# Patient Record
Sex: Female | Born: 1970 | Race: Black or African American | Hispanic: No | Marital: Single | Smoking: Current every day smoker
Health system: Southern US, Community
[De-identification: ages and names within clinical notes are randomized; demographics above are authoritative.]

## PROBLEM LIST (undated history)

## (undated) ENCOUNTER — Emergency Department (HOSPITAL_COMMUNITY): Admission: EM | Payer: Medicaid Other | Source: Home / Self Care

## (undated) DIAGNOSIS — I10 Essential (primary) hypertension: Secondary | ICD-10-CM

## (undated) DIAGNOSIS — F32A Depression, unspecified: Secondary | ICD-10-CM

## (undated) DIAGNOSIS — R569 Unspecified convulsions: Secondary | ICD-10-CM

## (undated) DIAGNOSIS — E079 Disorder of thyroid, unspecified: Secondary | ICD-10-CM

## (undated) DIAGNOSIS — F329 Major depressive disorder, single episode, unspecified: Secondary | ICD-10-CM

## (undated) DIAGNOSIS — N289 Disorder of kidney and ureter, unspecified: Secondary | ICD-10-CM

## (undated) HISTORY — PX: CERVICAL CERCLAGE: SHX1329

---

## 2000-09-26 ENCOUNTER — Other Ambulatory Visit: Admission: RE | Admit: 2000-09-26 | Discharge: 2000-09-26 | Payer: Self-pay | Admitting: Obstetrics & Gynecology

## 2000-10-11 ENCOUNTER — Encounter (INDEPENDENT_AMBULATORY_CARE_PROVIDER_SITE_OTHER): Payer: Self-pay

## 2000-10-11 ENCOUNTER — Other Ambulatory Visit: Admission: RE | Admit: 2000-10-11 | Discharge: 2000-10-11 | Payer: Self-pay | Admitting: Obstetrics & Gynecology

## 2001-04-06 ENCOUNTER — Encounter (INDEPENDENT_AMBULATORY_CARE_PROVIDER_SITE_OTHER): Payer: Self-pay

## 2001-04-06 ENCOUNTER — Inpatient Hospital Stay (HOSPITAL_COMMUNITY): Admission: AD | Admit: 2001-04-06 | Discharge: 2001-04-08 | Payer: Self-pay | Admitting: Obstetrics & Gynecology

## 2003-06-23 ENCOUNTER — Other Ambulatory Visit: Admission: RE | Admit: 2003-06-23 | Discharge: 2003-06-23 | Payer: Self-pay | Admitting: Obstetrics & Gynecology

## 2003-08-17 ENCOUNTER — Inpatient Hospital Stay (HOSPITAL_COMMUNITY): Admission: AD | Admit: 2003-08-17 | Discharge: 2003-08-26 | Payer: Self-pay | Admitting: *Deleted

## 2003-08-21 ENCOUNTER — Encounter (INDEPENDENT_AMBULATORY_CARE_PROVIDER_SITE_OTHER): Payer: Self-pay | Admitting: *Deleted

## 2003-08-24 ENCOUNTER — Encounter: Payer: Self-pay | Admitting: Pulmonary Disease

## 2004-11-03 ENCOUNTER — Emergency Department (HOSPITAL_COMMUNITY): Admission: EM | Admit: 2004-11-03 | Discharge: 2004-11-03 | Payer: Self-pay | Admitting: Emergency Medicine

## 2005-07-20 ENCOUNTER — Ambulatory Visit: Payer: Self-pay | Admitting: *Deleted

## 2005-07-20 ENCOUNTER — Inpatient Hospital Stay (HOSPITAL_COMMUNITY): Admission: AD | Admit: 2005-07-20 | Discharge: 2005-08-22 | Payer: Self-pay | Admitting: Obstetrics

## 2005-08-19 ENCOUNTER — Encounter (INDEPENDENT_AMBULATORY_CARE_PROVIDER_SITE_OTHER): Payer: Self-pay | Admitting: Specialist

## 2006-03-14 ENCOUNTER — Ambulatory Visit (HOSPITAL_COMMUNITY): Admission: RE | Admit: 2006-03-14 | Discharge: 2006-03-14 | Payer: Self-pay | Admitting: Obstetrics

## 2006-04-17 ENCOUNTER — Ambulatory Visit (HOSPITAL_COMMUNITY): Admission: RE | Admit: 2006-04-17 | Discharge: 2006-04-17 | Payer: Self-pay | Admitting: Obstetrics

## 2006-04-20 ENCOUNTER — Inpatient Hospital Stay (HOSPITAL_COMMUNITY): Admission: RE | Admit: 2006-04-20 | Discharge: 2006-04-24 | Payer: Self-pay | Admitting: Obstetrics

## 2006-05-03 ENCOUNTER — Ambulatory Visit: Payer: Self-pay | Admitting: Internal Medicine

## 2006-05-03 ENCOUNTER — Inpatient Hospital Stay (HOSPITAL_COMMUNITY): Admission: AD | Admit: 2006-05-03 | Discharge: 2006-08-24 | Payer: Self-pay | Admitting: Obstetrics & Gynecology

## 2006-05-09 ENCOUNTER — Encounter: Payer: Self-pay | Admitting: Obstetrics & Gynecology

## 2006-05-10 ENCOUNTER — Encounter: Payer: Self-pay | Admitting: Obstetrics

## 2006-05-11 ENCOUNTER — Encounter (INDEPENDENT_AMBULATORY_CARE_PROVIDER_SITE_OTHER): Payer: Self-pay | Admitting: *Deleted

## 2006-05-16 ENCOUNTER — Encounter: Payer: Self-pay | Admitting: Obstetrics

## 2006-05-24 ENCOUNTER — Encounter: Payer: Self-pay | Admitting: Obstetrics

## 2006-08-11 ENCOUNTER — Encounter: Payer: Self-pay | Admitting: Obstetrics & Gynecology

## 2006-08-22 ENCOUNTER — Encounter (INDEPENDENT_AMBULATORY_CARE_PROVIDER_SITE_OTHER): Payer: Self-pay | Admitting: Specialist

## 2007-08-28 ENCOUNTER — Emergency Department (HOSPITAL_COMMUNITY): Admission: EM | Admit: 2007-08-28 | Discharge: 2007-08-28 | Payer: Self-pay | Admitting: Emergency Medicine

## 2008-12-25 ENCOUNTER — Encounter (HOSPITAL_COMMUNITY): Admission: RE | Admit: 2008-12-25 | Discharge: 2009-02-20 | Payer: Self-pay | Admitting: Endocrinology

## 2009-01-09 ENCOUNTER — Ambulatory Visit (HOSPITAL_COMMUNITY): Admission: RE | Admit: 2009-01-09 | Discharge: 2009-01-09 | Payer: Self-pay | Admitting: Endocrinology

## 2009-12-09 ENCOUNTER — Encounter (HOSPITAL_COMMUNITY): Admission: RE | Admit: 2009-12-09 | Discharge: 2010-01-08 | Payer: Self-pay | Admitting: Endocrinology

## 2010-01-01 ENCOUNTER — Inpatient Hospital Stay (HOSPITAL_COMMUNITY): Admission: EM | Admit: 2010-01-01 | Discharge: 2010-01-02 | Payer: Self-pay | Admitting: Emergency Medicine

## 2010-01-02 ENCOUNTER — Ambulatory Visit: Payer: Self-pay | Admitting: Psychiatry

## 2010-01-02 ENCOUNTER — Inpatient Hospital Stay (HOSPITAL_COMMUNITY): Admission: RE | Admit: 2010-01-02 | Discharge: 2010-01-04 | Payer: Self-pay | Admitting: Psychiatry

## 2010-03-04 ENCOUNTER — Ambulatory Visit: Payer: Self-pay | Admitting: Psychiatry

## 2010-05-09 ENCOUNTER — Encounter: Payer: Self-pay | Admitting: Obstetrics

## 2010-05-10 ENCOUNTER — Encounter: Payer: Self-pay | Admitting: Internal Medicine

## 2010-07-01 LAB — URINALYSIS, ROUTINE W REFLEX MICROSCOPIC
Glucose, UA: NEGATIVE mg/dL
Ketones, ur: NEGATIVE mg/dL
Nitrite: NEGATIVE
Protein, ur: NEGATIVE mg/dL
Urobilinogen, UA: 0.2 mg/dL (ref 0.0–1.0)

## 2010-07-01 LAB — DIFFERENTIAL
Basophils Absolute: 0 10*3/uL (ref 0.0–0.1)
Basophils Relative: 0 % (ref 0–1)
Eosinophils Absolute: 0 10*3/uL (ref 0.0–0.7)
Neutrophils Relative %: 71 % (ref 43–77)

## 2010-07-01 LAB — COMPREHENSIVE METABOLIC PANEL
ALT: 9 U/L (ref 0–35)
Alkaline Phosphatase: 45 U/L (ref 39–117)
CO2: 24 mEq/L (ref 19–32)
GFR calc non Af Amer: >60 mL/min (ref >60)
Glucose, Bld: 111 mg/dL — ABNORMAL HIGH (ref 70–99)
Potassium: 3.4 mEq/L — ABNORMAL LOW (ref 3.5–5.1)
Sodium: 136 mEq/L (ref 135–145)

## 2010-07-01 LAB — URINE MICROSCOPIC-ADD ON

## 2010-07-01 LAB — CBC
HCT: 36 % (ref 36.0–46.0)
Hemoglobin: 12.5 g/dL (ref 12.0–15.0)
MCHC: 34.7 g/dL (ref 30.0–36.0)
MCV: 89.7 fL (ref 78.0–100.0)
WBC: 5.1 10*3/uL (ref 4.0–10.5)

## 2010-07-01 LAB — SALICYLATE LEVEL: Salicylate Lvl: <4 mg/dL (ref 2.8–20.0)

## 2010-07-01 LAB — ETHANOL: Alcohol, Ethyl (B): <5 mg/dL (ref 0–10)

## 2010-07-01 LAB — RAPID URINE DRUG SCREEN, HOSP PERFORMED
Benzodiazepines: POSITIVE — AB
Cocaine: NOT DETECTED

## 2010-07-01 LAB — POCT PREGNANCY, URINE: Preg Test, Ur: NEGATIVE

## 2010-09-03 NOTE — Discharge Summary (Signed)
NAMEJAYDYN, Jill Hopkins              ACCOUNT NO.:  [id number]   MEDICAL RECORD NO.:  [id number]          PATIENT TYPE:  INP   LOCATION:  9302                          [facility]  [facility]   PHYSICIAN:  Roseanna Rainbow, M.D.DATE OF BIRTH:  April 20, 1970   DATE OF ADMISSION:  04/20/2006  DATE OF DISCHARGE:  04/24/2006                               DISCHARGE SUMMARY   CHIEF COMPLAINT:  The patient is a 40 year old para 0 African American  female with likely cervical insufficiency with an early intrauterine  pregnancy for a McDonald cervical cerclage.   HISTORY OF PRESENT ILLNESS:  Please see the above.  The patient has had  multiple first and second trimester losses.   ALLERGIES:  No known drug allergies.   MEDICATIONS:  Prenatal vitamins, Pepcid.   PAST SURGICAL HISTORY:  Cervical cerclage.   PAST MEDICAL HISTORY:  She denies.   PHYSICAL EXAMINATION:  VITAL SIGNS:  Pulse 85, respirations 16, blood  pressure 111/72.  GENERAL APPEARANCE:  Well-nourished, well-developed female in no  apparent distress.  HEENT:  Normocephalic, atraumatic.  NECK:  Supple.  LUNGS:  Clear to auscultation bilaterally.  HEART:  Regular rate and rhythm.  ABDOMEN:  Soft, nontender.  ADENOPATHY:  None.  PELVIC:  The external female genitalia normal appearing.  On bimanual  exam the cervix is long and closed.  EXTREMITIES:  No clubbing, cyanosis or edema.  SKIN:  Without rash.   ASSESSMENT:  Cervical insufficiency.   The planned procedure was a McDonald cerclage.   HOSPITAL COURSE:  Please see the dictated operative summary for further  details.  She was given a course of parenteral clindamycin and continued  on bedrest in the hospital.  She was then discharged to home on January  7.   DISCHARGE DIAGNOSIS:  Cervical insufficiency early pregnancy.   PROCEDURE:  McDonald cerclage.   CONDITION:  Stable.   DIET:  Regular.   ACTIVITY:  Bedrest, pelvic rest.   DISPOSITION:  The patient was to follow  up in the office in 2 weeks.      Roseanna Rainbow, M.D.  Electronically Signed     LAJ/MEDQ  D:  07/28/2006  T:  07/28/2006  Job:  [id number]

## 2010-09-03 NOTE — Discharge Summary (Signed)
[facility]  Patient:    [name], [name] Visit Number: [id number] MRN: [id number]          Service Type: OBS Location: 9300 [id number] Attending Physician:  Genia Del Dictated by:   Genia Del, M.D. Admit Date:  04/06/2001 Discharge Date: 04/08/2001                             Discharge Summary  DATE OF BIRTH:  [birth year]  ADMISSION DIAGNOSES: 1. Premature rupture of membranes at 17+ weeks. 2. Preterm labor with clinical chorioamnionitis. 3. Possible cervical incompetence.  DISCHARGE DIAGNOSES: 1. Premature rupture of membranes at 17+ weeks. 2. Preterm labor with clinical chorioamnionitis. 3. Possible cervical incompetence. 4. Preterm birth. 5. Intrauterine fetal demise at 17+ weeks.  HOSPITAL COURSE:  The patient was admitted on April 06, 2001, at 17+ weeks with preterm rupture of membranes, ______ labor, clinical chorioamnionitis, possible preceding cervical incompetence.  She had a very rapid labor, and ______ vaginal delivery in breech presentation.  Apgars were 0 and 0. Placenta delivered spontaneously and was completed three vessel.  Baby showed no obvious anomaly.  There was a malodorous smell to the amniotic fluid and placenta.  The patient had been started on clindamycin IV upon admission.  The estimated blood loss at the time of delivery was 250 cc.  No tear occurred, no complications.  Rh positive.  The patient was continued on clindamycin IV in the postpartum period.  On postpartum day #1, she was doing well, but was very tired and sad.  Her vital signs were stable.  Temperature was 98 degrees.  Her hemoglobin was 8.8.  On postoperative day #2, she was doing very good, but feeling depressed.  No suicidal ideation.  Her vital signs were still stable. She was afebrile.  After discussion of her risks of postpartum depression, especially in the circumstances, she was started on Zoloft 50 mg q.d.  The patient was  informed about usage, benefits, and risks, and was told that the antidepressant effects would appear only two weeks after starting the medication.  She was also prescribed a few Ambiens and Tylox p.r.n.  She was discharged home, and postpartum advise was given.  Because of anemia, she was advised slow ______.  FOLLOWUP:  She is to follow up with me at [facility] on April 23, 2001. Dictated by:   Genia Del, M.D. Attending Physician:  Genia Del DD:  04/30/01 TD:  05/01/01 Job: [id number]

## 2010-09-03 NOTE — Op Note (Signed)
Jill Hopkins, Jill Hopkins              ACCOUNT NO.:  [id number]   MEDICAL RECORD NO.:  [id number]          PATIENT TYPE:  INP   LOCATION:  9371                          [facility]   PHYSICIAN:  Charles A. Clearance Coots, M.D.DATE OF BIRTH:  08-31-1970   DATE OF PROCEDURE:  07/22/2005  DATE OF DISCHARGE:                                 OPERATIVE REPORT   PREOPERATIVE DIAGNOSIS:  [redacted] weeks gestation with cervical incompetence.   POSTOPERATIVE DIAGNOSIS:  [redacted] weeks gestation with cervical incompetence.   PROCEDURE:  Rescue McDonald cervical cerclage.   SURGEON:  Charles A. Clearance Coots, M.D.   ASSISTANT:  Roseanna Rainbow, M.D.   ANESTHESIA:  Spinal.   ESTIMATED BLOOD LOSS:  Negligible.   COMPLICATIONS:  None.   DRAINS:  Foley to gravity.   FINDINGS:  Dilated cervix with doming of membranes   OPERATION:  The patient was brought to the operating room and after  satisfactory spinal anesthesia, the patient was brought up in stirrups and  placed in steep Trendelenburg position. The vagina was prepped and draped in  usual sterile fashion.  The urinary bladder was emptied of approximately 100  mL of clear urine.  A sterile weighted speculum was placed in the posterior  vaginal vault and the cervix was isolated with the use of Deaver retractor.  The membranes had receded back into the cervical canal and were just  visible.  The cervix appeared about 2 cm dilated. A 30 mL Foley bulb was  then placed down the cervical canal and was inflated and the membranes were  pushed back further towards the lower uterine segment. The Foley bulb was  then capped, inflated, and placed keeping the membranes away from the  operative field. The anterior lip of the cervix was then grasped at the 12  o'clock position and a rescue McDonald cervical cerclage was then placed  starting at the 12 o'clock position and going counterclockwise in routine  fashion back to the 12 o'clock position. There were no  interoperative  complications.  The knot was then cinched and the Foley bulb was deflated.  The Foley catheter was removed.  The knot was then tied down securely with  several knots and was cut approximately 1 inch from the knot for  identification in the future. The vaginal vault had been douched with a  mixture of 150 mg of clindamycin in 60 mL of normal saline, 30 mL of the  clindamycin solution was placed in the vaginal vault prior to surgical  procedure and the remaining 30 mL  of clindamycin solution was placed in the vaginal vault at the conclusion of  the procedure and was left there. There was no active bleeding at the  conclusion of the procedure. All instruments were retired. The patient  tolerated the procedure well and was transported to recovery in satisfactory  condition.      Charles A. Clearance Coots, M.D.  Electronically Signed     CAH/MEDQ  D:  07/22/2005  T:  07/22/2005  Job:  [id number]

## 2010-09-03 NOTE — Discharge Summary (Signed)
NAME:  Jill Hopkins, Jill Hopkins                        ACCOUNT NO.:  [id number]   MEDICAL RECORD NO.:  [id number]                   PATIENT TYPE:  INP   LOCATION:  5743                                 FACILITY:  [facility]   PHYSICIAN:  Oley Balm. Sung Amabile, M.D. [facility]          DATE OF BIRTH:  02-Jun-1970   DATE OF ADMISSION:  08/21/2003  DATE OF DISCHARGE:  08/26/2003                                 DISCHARGE SUMMARY   ADMISSION DIAGNOSES:  1. Endometritis.  2. Sepsis.  3. Acute lung injury.  4. Anemia.   DISCHARGE DIAGNOSES:  1. Endometritis.  2. Sepsis.  3. Acute lung injury.  4. Anemia.   HISTORY OF PRESENT ILLNESS:  Please refer to the admission history and  physical for this patient's initial presentation.  Briefly, she was  initially admitted to [facility] on Aug 18, 2003 with a 17-week  pregnancy.  She suffered ruptured membranes and ultimately, a spontaneous  abortion.  She did require a D&C to deliver the placenta.  Shortly  thereafter, she became hypotensive and hypoxemic.  She was consequently  transferred to [facility] for further management, where the  Critical Care Medicine Service assumed her care.  She was admitted with the  above diagnoses.   HOSPITAL COURSE:  On admission, her chest x-ray demonstrated diffuse  pulmonary infiltrates consistent with acute lung injury.  Her gas exchange  was never severely impaired and she never require more than 4 L by nasal  cannula.  Her blood pressure improved with volume resuscitation.  She did  also have mild DIC.  She was treated with broad-spectrum antibiotics, Zosyn,  and also received packed red blood cells for anemia.  On the evening of Aug 21, 2003, she was seen by Dr. Shan Levans, who initiated therapy with  Xigris, based on 2-organ failure, acute lung injury and DIC.  By Aug 22, 2003, she was markedly improved and orders were given to ambulate her and  advance her diet.  By Aug 23, 2003, she was so dramatically  improved that the  Xigris was discontinued before completing the course.  She was transferred  to a stepdown unit.  By Aug 24, 2003, her antibiotics were changed to  ciprofloxacin.  She was noted to still have a leukocytosis at that time with  a white blood cell count of 32,500.  By Aug 25, 2003, her white count had  dropped to 18,000 and she was complaining of right upper quadrant pain.  Her  abdominal exam at that time was unremarkable.  A right upper quadrant  abdominal ultrasound was performed which demonstrated no evidence of  cholecystitis or gallstones.  Hepatic panel and pancreatic enzymes were  normal.  On the day of discharge, she was markedly improved, but still with  mild abdominal pain.  She continues to complain of vaginal bleeding.  It is  notable, however, that her hemoglobin and hematocrit had increased  progressively  over the past 3 days.  She has not had any hemodynamic  instability over the past couple of days.  In addition, her chest x-ray has  cleared dramatically and her oxygen saturations are normal on room air.  By  the time of discharge, she is overall dramatically improved with no ongoing  active issues requiring hospitalization.   DISCHARGE MEDICATIONS:  1. Tylenol as needed for pain.  2. Percocet 5/500 mg 1 p.o. q.4-6 h. p.r.n.   FOLLOWUP INSTRUCTIONS:  She has been instructed to call her obstetrician,  Dr. Genia Del, to arrange followup later this week and to address  the issue of vaginal bleeding.  I have not scheduled a followup with me, but  would be happy to see her at any time in the future as deemed necessary by  her other care providers.                                                Oley Balm Sung Amabile, M.D. [facility]    DBS/MEDQ  D:  08/26/2003  T:  08/27/2003  Job:  [id number]   cc:   Genia Del, M.D.  [address]  [place]  [place] [address]  Fax: [phone]   Chester Holstein. Earlene Plater, M.D.  [address]  [place]  [place] [address]  Fax: [phone]

## 2010-09-03 NOTE — Op Note (Signed)
Jill Hopkins, Jill Hopkins              ACCOUNT NO.:  [id number]   MEDICAL RECORD NO.:  [id number]          PATIENT TYPE:  AMB   LOCATION:  SDC                           [facility]   PHYSICIAN:  Charles A. Clearance Coots, M.D.DATE OF BIRTH:  1971/03/31   DATE OF PROCEDURE:  04/20/2006  DATE OF DISCHARGE:                               OPERATIVE REPORT   PREOPERATIVE DIAGNOSIS:  Incompetent cervix.   POSTOPERATIVE DIAGNOSIS:  Incompetent cervix.   PROCEDURE:  McDonald cervical cerclage.   SURGEON:  Coral Ceo, M.D.   ANESTHESIA:  Spinal.   ESTIMATED BLOOD LOSS:  Minimal.   COMPLICATIONS:  None.   CATHETERS:  Foley to gravity.   OPERATION:  The patient was brought to the operating room and after  satisfactory spinal anesthesia, the legs were brought up in stirrups and  the vagina was prepped and draped in the usual sterile fashion.  The  urinary bladder was emptied of approximately 50 mL of clear urine.  A  sterile weighted speculum was then inserted into the posterior vaginal  vault and the cervix was isolated with the aid of Sims retractors.  A  McDonald cervical cerclage was then placed using a medium Mayo needle  with double throw of a #5 silk suture.  The stitch was cinched down at  the conclusion of the procedure and multiple knots were tied for future  identification of the stitch along with approximately a 1 inch tail of  suture.  There was no active bleeding at the conclusion of the  procedure.  All instruments were retired.  The vagina was lavaged with a  30 mL mixture of 150 mg of clindamycin and 30 mL of normal saline.  The  patient tolerated the procedure well and was transported to the recovery  room in satisfactory condition.      Charles A. Clearance Coots, M.D.  Electronically Signed     CAH/MEDQ  D:  04/20/2006  T:  04/20/2006  Job:  [id number]

## 2010-09-03 NOTE — Discharge Summary (Signed)
NAMESETSUKO, ROBINS              ACCOUNT NO.:  [id number]   MEDICAL RECORD NO.:  [id number]          PATIENT TYPE:  INP   LOCATION:  9319                          [facility]   PHYSICIAN:  Charles A. Clearance Coots, M.D.DATE OF BIRTH:  [birth year]   DATE OF ADMISSION:  05/03/2006  DATE OF DISCHARGE:  08/24/2006                               DISCHARGE SUMMARY   ADMITTING DIAGNOSES:  1. Fifteen weeks gestation.  2. Incompetent cervix with McDonald cerclage intact with twin      gestation.  3. History of multiple first and second trimester losses.   DISCHARGE DIAGNOSIS:  1. Fifteen weeks gestation.  2. Incompetent cervix with McDonald cerclage intact with twin      gestation.  3. History of multiple first and second trimester losses.  4. Failed McDonald cervical cerclage.  5. Spontaneous rupture of membranes.  6. Delivery of first twin A at [redacted] weeks gestation on May 11, 2006.      The twin was nonviable The cerclage was removed, umbilical cord cut      at the cervix, patient placed on bedrest.  7. After on prolonged bedrest, status post delivery of second twin B      on Aug 22, 2006, at 0547 at [redacted] weeks gestation.  Apgars of 1 at 1      minute, 7 at 5 minutes, weight of 1458 grams, length of 42 cm. The      infant was admitted to the neonatal intensive care unit. Mother was      discharged home on postpartum day #2 in good condition   REASON FOR ADMISSION:  A 40 year old black female, G5, P0-0-4-0, twin  gestation with McDonald cervical cerclage placed at approximately [redacted]  weeks gestation. Presented at [redacted] weeks gestation with complaint of  brownish discharge earlier in the day.   PAST MEDICAL HISTORY:   SURGERY:  Cerclage.   ILLNESSES:  Anemia.   MEDICATIONS:  Prenatal vitamins, progesterone.   ALLERGIES:  No known drug allergies.   SOCIAL HISTORY:  Single. Negative for tobacco, alcohol or recreational  drug use.   GYN HISTORY:  Positive for history of genital  herpes.   OB HISTORY:  Multiple pregnancy losses first and second trimester, known  history of cervical incompetence with a McDonald cerclage intact with  this pregnancy for twins.   PHYSICAL EXAMINATION:  GENERAL:  Well-nourished, well-developed black  female in no acute distress.  VITAL SIGNS:  Temperature 98.3, pulse 98, respiratory rate 18, blood  pressure 132/84.  LUNGS:  Clear to auscultation bilaterally.  HEART:  Regular rate and rhythm.  ABDOMEN: Nontender.  PELVIC:  Cervix on digital exam was closed and long. Doppler fetal heart  tones were 140-150 beats per minute. On sterile speculum exam, there was  scant amount of bright red blood.   IMPRESSION:  1. Twin gestation at approximately 15 weeks.  2. Incompetent cervix.  3. Probable vaginitis.   PLAN:  1. Admit to bedrest.  2. Start parenteral antibiotics   HOSPITAL COURSE:  The patient was admitted and placed on bedrest.  Ultrasound on hospital day #7 revealed  shortened cervical length of  approximately 0.7 centimeters with funneling above the stitch. The  findings were discussed with the patient and recommended examination in  the operating room and possible placement of second cerclage. The  patient was taken to the operating room on hospital day #9. On  examination, the membranes were bulging. An attempt was made to place a  second cerclage that was successful, but the patient had spontaneous  rupture of membranes at the conclusion of the procedure. The cerclage  was removed, and the first cerclage was also removed, and the patient  progressed in the OR to spontaneous vaginal delivery of twin A which was  nonviable. There was no active bleeding or cramping at the conclusion of  the procedure, and the umbilical cord was cut at the cervix. The patient  was taken to the floor, placed on bed rest, and parenteral antibiotic  therapy and continued with pregnancy of second twin B without any  significant cramping. The  patient remained on bedrest until [redacted] weeks  gestation when she noted the onset of uterine contractions, and she was  taken to the labor and delivery floor for expectant management.  She  progressed to spontaneous vaginal delivery of a viable female infant on  Aug 22, 2006, at 0547, Apgars of 5 at 1 minute, 7 at 5 minutes.  The  infant was taken to the neonatal intensive care unit for prematurity.  The patient did well postpartum and was discharged home on postpartum  day #2 in good condition.   ADMITTING LABORATORY VALUES:  Hemoglobin 10.1 hematocrit 29.5, white  blood cell count 7100, platelets 298,000.   DISCHARGE LABORATORY VALUES:  Hemoglobin 10.7, hematocrit 31.3, white  blood cell count 8300, platelets 273,000   DISCHARGE DISPOSITION:   MEDICATIONS:  Prenatal vitamins, and ibuprofen was prescribed for pain.  Routine written instructions were given for discharge after vaginal  delivery.  The patient is to call the office for a followup appointment  in 6 weeks.      Charles A. Clearance Coots, M.D.  Electronically Signed    CAH/MEDQ  D:  09/24/2006  T:  09/24/2006  Job:  [id number]

## 2010-09-03 NOTE — Op Note (Signed)
Jill Hopkins, Jill Hopkins              ACCOUNT NO.:  [id number]   MEDICAL RECORD NO.:  [id number]          PATIENT TYPE:  OUT   LOCATION:  MFM                           [facility]   PHYSICIAN:  Charles A. Clearance Coots, M.D.DATE OF BIRTH:  11/16/1970   DATE OF PROCEDURE:  05/11/2006  DATE OF DISCHARGE:                               OPERATIVE REPORT   PREOPERATIVE DIAGNOSES:  1. Sixteen weeks' gestation, twins.  2. Failed cerclage.   POSTOPERATIVE DIAGNOSIS:  1. Sixteen weeks' gestation, twins.  2. Failed cerclage.  3. Spontaneous rupture of membranes with spontaneous vaginal delivery      of twin A at time of second cerclage placement.   PROCEDURE:  Cervical cerclage.   SURGEON:  Charles A. Clearance Coots, MD   ASSISTANT:  Ginger Carne, MD   ANESTHESIA:  Spinal.   ESTIMATED BLOOD LOSS:  50 mL.   COMPLICATIONS:  Spontaneous rupture of membranes after cerclage  placement.   ASSESSMENT:  Baby A of twin gestation submitted to pathology for  evaluation.   FINDINGS:  Bulging of membranes through the cervical os at the beginning  of procedure upon evaluation of the cervix.   OPERATION:  The patient was brought to the operating room and after  satisfactory spinal anesthesia, the legs were brought up in stirrups and  the vagina was prepped and draped in the usual sterile fashion.  The  urinary bladder was emptied of approximately 100 mL of clear urine.  A  sterile weighted speculum was inserted in the posterior vaginal vault.  The cervix was isolated with the aid of a Deaver retractor and upon  observation of the cervix, the cervix could not be visualized at the  time because of bulging of the membranes through the cervical os.  The  patient was placed in Trendelenburg position and the membranes were  gently pushed back into the uterine cavity and a 30 mL Foley bulb was  placed down the cervical canal past the internal os and inflated,  keeping the membranes retained within the  uterine cavity.  The anterior  and posterior lip of the cervix was then grasped with a ring forceps and  the anterior vesicouterine space was dissected out and the bladder was  pushed back away from the anterior cervix.  The posterior cul-de-sac was  also dissected out and good hemostasis was obtained anteriorly and  posteriorly.  A Shirodkar-type cerclage placement was then done in  routine fashion with Mersilene and the knot was cinched and securely  tied down with multiple knots and the tail was cut approximately a half-  inch.  Upon further visualization, the membranes again protruded through  the cervical os that had been tightly secured with the stitch and then  the membranes progressed to spontaneously rupture with clear fluid being  expelled.  The first McDonald suture and the second cerclage that had  just been placed were both removed and the fetus then presented breech  and was spontaneously delivered.  The umbilical cord was cut and left  intact.  Approximately 3 inches of umbilical cord were protruding  through the  cervical os.  There was no active bleeding at the conclusion  of the procedure.  The mucosal surfaces that had been incised for  placement of the cerclage were closed with continuous interlocking  sutures of 2-0 Monocryl.  All instruments were then retired.  The  patient tolerated the procedure well, was transported to the recovery  room in satisfactory condition.      Charles A. Clearance Coots, M.D.  Electronically Signed     CAH/MEDQ  D:  05/11/2006  T:  05/11/2006  Job:  [id number]

## 2010-09-03 NOTE — Discharge Summary (Signed)
NAME:  WING, GFELLER                        ACCOUNT NO.:  [id number]   MEDICAL RECORD NO.:  [id number]                   PATIENT TYPE:  INP   LOCATION:  9374                                 [facility]ACILITY:  [facility]   PHYSICIAN:  Gerri Spore B. Earlene Plater, M.D.               DATE OF BIRTH:  Mar 25, 1971   DATE OF ADMISSION:  08/17/2003  DATE OF DISCHARGE:  08/21/2003                                 DISCHARGE SUMMARY   ADMISSION DIAGNOSES:  1. Seventeen-week intrauterine pregnancy.  2. Preterm premature rupture of membranes.  3. Chorioamnionitis.  4. Sepsis.   DISCHARGE DIAGNOSES:  1. Seventeen-week intrauterine pregnancy.  2. Preterm premature rupture of membranes.  3. Chorioamnionitis.  4. Sepsis.   HISTORY OF PRESENT ILLNESS:  For complete details, please see the History  and Physical in the chart.  Briefly, the patient presented after traveling  from out-of-town to [place], [place] with ruptured membranes at 17  weeks.  She was confirmed to be ruptured at an outlying hospital in [place],  [place] and it was recommended to the patient that she stay  hospitalized there to be stabilized, given antibiotics to rule out  infection, and if remained stable to be transferred back to [facility].  However, the patient signed out against medical advice and drove  herself back to [place].  I saw the patient on the day of admission at  [facility] maternity admissions, confirmed ruptured membranes, and  ruled out active chorioamnionitis given absence of fever, uterine  tenderness, or significant leukocytosis.   The patient expressed a strong desire to retain the pregnancy if at all  possible as she has had a previous loss in a similar fashion.  It was  discussed with the patient that infection was a significant risk as was  preterm labor.  The patient stated understanding of these issues but  requested expectant management in the hospital.  Given this, I recommended  we treat  her with ampicillin and erythromycin per the P-PROM protocol.   HOSPITAL COURSE:  The patient was admitted and placed on ampicillin and  erythromycin per P-PROM protocol and managed expectantly.  She remained  stable until hospital day #3 when she spiked a fever of 101.8.  She was  complaining of myalgias, low back pain, low abdominal pain.  Was noted to  have uterine tenderness and I diagnosed the patient with chorioamnionitis  and recommended delivery.   The patient was placed on ampicillin, gentamycin, and clindamycin and a  Cytotec induction begun.  She delivered approximately 4 hours later of a  nonviable 17-week female fetus.  The placenta did not deliver immediately.  The patient was stable without active bleeding.  I therefore recommended we  try Pitocin to facilitate delivery of the placenta.  Approximately 2-3 hours  later the patient was showing signs of early sepsis with hypotension,  tachycardia, and low uterine output.  Given these issues I  recommended we  proceed with a suction curettage to evacuate the uterus.   She subsequently underwent suction curettage without incident.  She was  noted to be hypotensive in the 70s over 30s in the PACU.  This did not  respond to aggressive fluid resuscitation.  I therefore called a  consultation with Dr. Sung Amabile of intensive care unit at [facility].  She was also  noted around this same time to be mildly coagulopathic and mildly anemic.  In addition, a chest x-ray in the PACU showed evidence of noncardiogenic  pulmonary edema.  Given the evidence of multi-organ involvement I requested  consultation with Dr. Sung Amabile of critical care.   After discussing the case with Dr. Sung Amabile on the telephone he agreed it was  in the patient's best interests to be transferred to [facility] critical  care service as they now have a new treatment with Xigris for sepsis.   At this point the patient was discharged from our service and transferred to  Dr.  Sung Amabile' service.  Please see his dictated Discharge Summary for  complete details thereafter.  However, in summary, the patient ultimately  did well and was discharged home on Aug 26, 2003.                                               Gerri Spore B. Earlene Plater, M.D.    WBD/MEDQ  D:  08/27/2003  T:  08/27/2003  Job:  [id number]

## 2010-09-03 NOTE — Discharge Summary (Signed)
Jill Hopkins, Jill Hopkins              ACCOUNT NO.:  [id number]   MEDICAL RECORD NO.:  [id number]          PATIENT TYPE:  INP   LOCATION:  9303                          [facility]ACILITY:  [facility]   PHYSICIAN:  Charles A. Clearance Coots, M.D.DATE OF BIRTH:  28-Jul-1970   DATE OF ADMISSION:  07/20/2005  DATE OF DISCHARGE:  08/22/2005                                 DISCHARGE SUMMARY   ADMITTING DIAGNOSIS:  1.  [redacted] weeks gestation.  2.  Preterm cervical changes.  3.  Probable incompetent cervix.  4.  Admitted for cervical cerclage.   DISCHARGE DIAGNOSIS:  1.  [redacted] weeks gestation.  2.  Preterm cervical change.  3.  Probable incompetent cervix.  4.  Admitted for cervical cerclage.  5.  Status post cervical cerclage.  6.  Spontaneous rupture of membranes.  7.  Status post normal spontaneous vaginal delivery of a footling breech in      preterm labor on Aug 19, 2005.  Viable female fetus was delivered and was      resuscitated with Apgars of 1 at one minute, 1 at five minutes, 2 at 10      minutes, 5 at 15 minutes.  The infant was taken to the neonatal      intensive care unit for further resuscitation at approximately [redacted] weeks      gestation.  Mother discharged home in good condition on Aug 22, 2005.      The infant continues to be in the neonatal intensive care unit.   REASON FOR ADMISSION:  A 40 year old black female G4, P0, last menstrual  period March 09, 2005.  Patient presented to the office for her first  prenatal visit with a complaint of leaking yellowish fluid and cramping.  She had an obstetrical history that was significant for three previous  spontaneous abortions and was told that she had an incompetent cervix.  She  denied fever, chills, or dysuria   PAST MEDICAL HISTORY:   SURGERY:  None.   ILLNESSES:  None.   MEDICATIONS:  None.   ALLERGIES:  No known drug allergies.   SOCIAL HISTORY:  Single.  Negative tobacco, alcohol, or recreational drug  use.   PHYSICAL EXAMINATION:   GENERAL:  Well-nourished, well-developed female in no  acute distress.  VITAL SIGNS:  Temperature 98.4, blood pressure 147/96.  LUNGS:  Clear to auscultation bilaterally.  HEART:  Regular rate and rhythm.  ABDOMEN:  Gravid, nontender.  PELVIC:  Normal external female genitalia.  Vagina with moderate amount of  clear mucus in the vault.  The cervix on the speculum examination was noted  to be open, approximately 2-3 cm with membranes doming at the cervical os.   IMPRESSION:  1.  [redacted] weeks gestation.  2.  Preterm cervical changes.   PLAN:  Admit.  Bedrest.  Supportive management.  Will probably consider  rescue cerclage if clinically possible.   ADMITTING LABORATORY VALUES:  Hemoglobin 10.9, hematocrit 31.6, white blood  cell count 7200, platelets 292,000.  Comprehensive metabolic panel was  within normal limits.  Urinalysis revealed large leukocyte esterase, large  hemoglobin, 11-20 white blood cells,  7-10 red blood cells, specific gravity  of 1.01.   HOSPITAL COURSE:  The patient was admitted and placed in the Trendelenburg  position on complete bedrest.  Indwelling Foley catheter within the urinary  bladder for drainage.  She remained stable on bedrest and on hospital day #3  on examination cervix had remained stable and patient was taken to the  operating room for an examination under anesthesia and an attempt at rescue  cervical cerclage.  The patient was given spinal anesthesia and was placed  up in stirrups in the supine position in Trendelenburg and on speculum  examination the cervix was noted to be with adequate length and the  membranes were just barely visible and a decision was made to proceed with a  rescue McDonald cervical cerclage.  A McDonald rescue cervical cerclage was  performed on July 22, 2005 without complications and an excellent stitch was  placed giving the patient excellent cervical length of approximately 3 cm of  cervical length on examination.  She was  taken back to the floor and was  continued on Trendelenburg position and strict bedrest with an indwelling  Foley, perioperative antibiotic coverage.  On postoperative day #5 the  patient had spontaneous rupture of membranes and the McDonald cervical  cerclage stitch was removed surgically because of the spontaneous rupture of  membranes.  The patient was continued on bedrest and did well on bedrest  without any significant evidence of infection or preterm labor symptoms  until May 4 when she indicated that she was having increased cramping that  had started on Aug 18, 2005 and had gotten progressively worse on May 4 and  patient also had noted increased vaginal bleeding.  Examination at that time  revealed the breech presenting in the vaginal vault as a footling breech and  the patient was taken to labor and delivery for delivery of the fetus.  A  footling breech delivery was performed on labor and delivery without  complications.  Because of the advanced stage of the delivery at the time of  examination of the patient initially and with the upper body still being in  the uterus it was not thought that the fetus could be repelled back into the  uterus for cesarean section delivery.  This was explained to the patient and  she cooperated quite well on labor/delivery and was able to complete the  footling breech extraction with two pushes in labor.  The fetus was handed  off to the nursery staff after the umbilical cord was cut and clamped and  neonatal resuscitation was then performed by the team.  The placenta was  retained and Cytotec 400 mg was placed in the vaginal vault along with IV  Pitocin and the patient was able to expel the placenta spontaneously after  the use of intravaginal Cytotec.  There was no active bleeding after  expulsion of the placenta and the uterus contracted down quite well and was firm.  The patient's postpartum course was uncomplicated and she was  discharged home  on postpartum day #3 in good condition.   LABORATORY VALUES:  Hemoglobin 10.8, hematocrit 31.4 white blood cell count  5600, platelets 284,000   DISCHARGE DISPOSITION:  Ibuprofen was prescribed for pain.  The patient is  to continue prenatal vitamins.  Routine written instructions were given for  obstetrical discharge after vaginal delivery.  The patient is to call the  office for a follow-up appointment in two weeks.      Leonette Most  Julio Alm, M.D.  Electronically Signed     CAH/MEDQ  D:  08/29/2005  T:  08/29/2005  Job:  [id number]

## 2010-09-03 NOTE — Op Note (Signed)
NAME:  Jill Hopkins, Jill Hopkins                        ACCOUNT NO.:  [id number]   MEDICAL RECORD NO.:  [id number]                   PATIENT TYPE:  INP   LOCATION:  9374                                 [facility]   PHYSICIAN:  Gerri Spore B. Earlene Plater, M.D.               DATE OF BIRTH:  1970/11/11   DATE OF PROCEDURE:  08/21/2003  DATE OF DISCHARGE:                                 OPERATIVE REPORT   PREOPERATIVE DIAGNOSES:  1. Seventeen-week premature prolonged rupture of membranes.  2. Chorioamnionitis.  3. Spontaneous vaginal delivery of nonviable fetus and retained placenta.  4. Possible early sepsis.   POSTOPERATIVE DIAGNOSES:  1. Seventeen-week premature prolonged rupture of membranes.  2. Chorioamnionitis.  3. Spontaneous vaginal delivery of nonviable fetus and retained placenta.  4. Possible early sepsis.   PROCEDURE:  Suction curettage.   SURGEON:  Marina Gravel, M.D.   ANESTHESIA:  MAC.   FINDINGS:  Retained placental tissue.   SPECIMENS:  Placenta.   COMPLICATIONS:  None.   BLOOD LOSS:  Minimal.   INDICATION:  The patient had been admitted with premature prolonged rupture  of membranes at 17 weeks pregnancy and had been managed expectantly with  antibiotics and bedrest.   On hospital day #3 the patient spiked a fever to 101.8, was noted to have  lower abdominal and low back pain, chills, and myalgias.  I diagnosed the  patient with chorioamnionitis and recommended Cytotec induction.   The patient subsequently delivered spontaneously a few hours later a  nonviable fetus.  The placenta did not deliver.  Approximately 3 hours later  the patient was showing signs of early sepsis with hypotension, low urine  output, mild tachycardia.  Given these concerns I recommended we proceed  with a suction curettage to evacuate the uterus.   DESCRIPTION OF PROCEDURE:  The patient was taken to the operating room and  MAC anesthesia obtained.  She was placed in the [place] stirrups and  prepped  and draped in standard fashion.  The bladder was already being drained with  a Foley catheter.  A speculum was inserted and the cervix visualized.  The  placenta was partially expelled through the cervix.  It was grasped with a  large ring clamp and removed manually.  The #14 suction cannula was then  inserted, suction turned on, and some additional tissue returned.  The  uterus was then gently curetted and no additional tissue found.  Ultrasound  was performed and the endometrial stripe appeared normal, suggesting  complete evacuation.  There was essentially no bleeding and therefore the  procedure was terminated.   The patient was taken to the recovery room still mildly hypotensive but in  stable condition.  Gerri Spore B. Earlene Plater, M.D.    WBD/MEDQ  D:  08/21/2003  T:  08/21/2003  Job:  [id number]

## 2011-03-22 ENCOUNTER — Other Ambulatory Visit: Payer: Self-pay | Admitting: Obstetrics

## 2011-03-22 DIAGNOSIS — Z1231 Encounter for screening mammogram for malignant neoplasm of breast: Secondary | ICD-10-CM

## 2011-04-27 ENCOUNTER — Ambulatory Visit (HOSPITAL_COMMUNITY)
Admission: RE | Admit: 2011-04-27 | Discharge: 2011-04-27 | Disposition: A | Discharge status: Home / Self Care | Payer: Managed Care, Other (non HMO) | Source: Ambulatory Visit | Attending: Obstetrics | Admitting: Obstetrics

## 2011-04-27 DIAGNOSIS — Z1231 Encounter for screening mammogram for malignant neoplasm of breast: Secondary | ICD-10-CM | POA: Insufficient documentation

## 2011-06-02 ENCOUNTER — Emergency Department (HOSPITAL_COMMUNITY)
Admission: EM | Admit: 2011-06-02 | Discharge: 2011-06-03 | Disposition: A | Discharge status: Home / Self Care | Payer: Managed Care, Other (non HMO) | Attending: Emergency Medicine | Admitting: Emergency Medicine

## 2011-06-02 DIAGNOSIS — E079 Disorder of thyroid, unspecified: Secondary | ICD-10-CM | POA: Insufficient documentation

## 2011-06-02 DIAGNOSIS — R51 Headache: Secondary | ICD-10-CM | POA: Insufficient documentation

## 2011-06-02 DIAGNOSIS — F17200 Nicotine dependence, unspecified, uncomplicated: Secondary | ICD-10-CM | POA: Insufficient documentation

## 2011-06-02 DIAGNOSIS — F329 Major depressive disorder, single episode, unspecified: Secondary | ICD-10-CM | POA: Insufficient documentation

## 2011-06-02 DIAGNOSIS — I10 Essential (primary) hypertension: Secondary | ICD-10-CM | POA: Insufficient documentation

## 2011-06-02 DIAGNOSIS — Z79899 Other long term (current) drug therapy: Secondary | ICD-10-CM | POA: Insufficient documentation

## 2011-06-02 DIAGNOSIS — F3289 Other specified depressive episodes: Secondary | ICD-10-CM | POA: Insufficient documentation

## 2011-06-02 HISTORY — DX: Disorder of thyroid, unspecified: E07.9

## 2011-06-02 HISTORY — DX: Major depressive disorder, single episode, unspecified: F32.9

## 2011-06-02 HISTORY — DX: Essential (primary) hypertension: I10

## 2011-06-02 HISTORY — DX: Depression, unspecified: F32.A

## 2011-06-03 ENCOUNTER — Emergency Department (HOSPITAL_COMMUNITY): Payer: Managed Care, Other (non HMO)

## 2011-06-03 ENCOUNTER — Encounter (HOSPITAL_COMMUNITY): Payer: Self-pay | Admitting: Emergency Medicine

## 2011-06-03 LAB — POCT I-STAT, CHEM 8
BUN: <3 mg/dL — ABNORMAL LOW (ref 6–23)
Creatinine, Ser: 0.9 mg/dL (ref 0.50–1.10)
Glucose, Bld: 112 mg/dL — ABNORMAL HIGH (ref 70–99)
Potassium: 4 mEq/L (ref 3.5–5.1)
Sodium: 139 mEq/L (ref 135–145)

## 2011-06-03 MED ORDER — DEXAMETHASONE SODIUM PHOSPHATE 10 MG/ML IJ SOLN
10.0000 mg | Freq: Once | INTRAMUSCULAR | Status: AC
  Administered 2011-06-03: 10 mg via INTRAVENOUS
  Filled 2011-06-03: qty 1

## 2011-06-03 MED ORDER — DIAZEPAM 5 MG PO TABS: 5.0000 mg | ORAL_TABLET | Freq: Once | ORAL | Status: DC

## 2011-06-03 MED ORDER — METOCLOPRAMIDE HCL 5 MG/ML IJ SOLN
10.0000 mg | Freq: Once | INTRAMUSCULAR | Status: AC
  Administered 2011-06-03: 10 mg via INTRAVENOUS
  Filled 2011-06-03: qty 2

## 2011-06-03 MED ORDER — SODIUM CHLORIDE 0.9 % IV BOLUS (SEPSIS)
1000.0000 mL | Freq: Once | INTRAVENOUS | Status: AC
  Administered 2011-06-03 (×2): 1000 mL via INTRAVENOUS

## 2011-06-03 MED ORDER — DIAZEPAM 5 MG PO TABS
10.0000 mg | ORAL_TABLET | Freq: Once | ORAL | Status: AC
  Administered 2011-06-03: 10 mg via ORAL
  Filled 2011-06-03 (×2): qty 1

## 2011-06-03 MED ORDER — DIPHENHYDRAMINE HCL 50 MG/ML IJ SOLN
25.0000 mg | Freq: Once | INTRAMUSCULAR | Status: AC
  Administered 2011-06-03: 25 mg via INTRAVENOUS
  Filled 2011-06-03: qty 1

## 2011-06-03 NOTE — ED Notes (Signed)
Patients h/a resolved sts no pain

## 2011-06-03 NOTE — ED Provider Notes (Signed)
History     CSN: [id number]  Arrival date & time 06/02/11  2355   First MD Initiated Contact with Patient 06/03/11 0104      Chief Complaint  Patient presents with  . Headache    HPI: Patient is a 41 y.o. female presenting with headaches. The history is provided by the patient.  Headache  This is a recurrent problem. The current episode started more than 2 days ago. The problem occurs constantly. The problem has been gradually worsening. The headache is associated with bright light. The pain is at a severity of 10/10. The pain is severe. Pertinent negatives include no fever, no nausea and no vomiting. She has tried acetaminophen for the symptoms.  Pt reports onset of h/a Monday that has persisted. Describes h/a as throbbing and at times sharp in top of head that extends to back of her head w/ pressure behind her eyes. Does admit to hx of h/a's and current h/a is similar to h/a's in past but persistent. Also admits to recent hx of sinus congestion.   Past Medical History  Diagnosis Date  . Hypertension   . Thyroid disease   . Depression     History reviewed. No pertinent past surgical history.  No family history on file.  History  Substance Use Topics  . Smoking status: Current Everyday Smoker -- 1.0 packs/day    Types: Cigarettes  . Smokeless tobacco: Not on file  . Alcohol Use: No    OB History    Grav Para Term Preterm Abortions TAB SAB Ect Mult Living                  Review of Systems  Constitutional: Negative for fever.  HENT: Positive for congestion.   Gastrointestinal: Negative for nausea and vomiting.  Neurological: Positive for headaches.    Allergies  Ibuprofen  Home Medications   Current Outpatient Rx  Name Route Sig Dispense Refill  . ARIPIPRAZOLE 2 MG PO TABS Oral Take 2 mg by mouth daily.    . DULOXETINE HCL 60 MG PO CPEP Oral Take 60 mg by mouth daily.    . ETONOGESTREL-ETHINYL ESTRADIOL 0.12-0.015 MG/24HR VA RING Vaginal Place 1 each  vaginally every 28 (twenty-eight) days. Insert vaginally and leave in place for 3 consecutive weeks, then remove for 1 week.    Marland Kitchen LEVOTHYROXINE SODIUM 125 MCG PO TABS Oral Take 125 mcg by mouth daily.    Marland Kitchen LORAZEPAM 1 MG PO TABS Oral Take 1 mg by mouth every 8 (eight) hours.    Marland Kitchen LOSARTAN POTASSIUM-HCTZ 100-25 MG PO TABS Oral Take 1 tablet by mouth daily.    Marland Kitchen METOPROLOL SUCCINATE ER 50 MG PO TB24 Oral Take 50 mg by mouth daily. Take with or immediately following a meal.    . ZOLPIDEM TARTRATE ER 12.5 MG PO TBCR Oral Take 12.5 mg by mouth at bedtime as needed.      BP 141/88  Pulse 66  Temp(Src) 97.9 F (36.6 C) (Oral)  Resp 20  Wt 240 lb (108.863 kg)  SpO2 100%  LMP 05/03/2011  Physical Exam  Constitutional: She is oriented to person, place, and time. She appears well-developed and well-nourished.  HENT:  Head: Normocephalic and atraumatic.  Eyes: Conjunctivae and EOM are normal. Pupils are equal, round, and reactive to light.  Neck: Neck supple.  Cardiovascular: Normal rate and regular rhythm.   Pulmonary/Chest: Effort normal and breath sounds normal.  Abdominal: Soft. Bowel sounds are normal.  Musculoskeletal: Normal range  of motion.  Neurological: She is alert and oriented to person, place, and time. She has normal strength and normal reflexes. No cranial nerve deficit. Coordination normal.  Skin: Skin is warm and dry. No erythema.  Psychiatric: She has a normal mood and affect.    ED Course  Procedures Pt reports complete resolution of h/a w/ IVF's and medication. BP has improved. Will plan for d/c home and encourage close f/u w/ PCP to re-eval persisent elevated BP. Pt is agreeable w/ plan.  Labs Reviewed  POCT I-STAT, CHEM 8 - Abnormal; Notable for the following:    BUN <3 (*)    Glucose, Bld 112 (*)    All other components within normal limits   Ct Head Wo Contrast  06/03/2011  *RADIOLOGY REPORT*  Clinical Data: Headache for few days.  No injury.  CT HEAD WITHOUT  CONTRAST  Technique:  Contiguous axial images were obtained from the base of the skull through the vertex without contrast.  Comparison: 01/01/2010  Findings: Ventricles and sulci are symmetrical.  No mass effect or midline shift.  No abnormal extra-axial fluid collections.  Wallace Cullens- white matter junctions are distinct.  Basal cisterns are not effaced.  No ventricular dilatation.  No evidence of acute intracranial hemorrhage.  Visualized paranasal sinuses are not opacified.  No depressed skull fractures.  Stable appearance since previous study.  IMPRESSION: Normal noncontrast head CT.  Original Report Authenticated By: Marlon Pel, M.D.     1. Headache   2. Hypertension       MDM  HPI/PE and clinical findings c/w 1. Headache (No focal neurological findings) 2. HTN (,much improved w/ resolution of h/a, known HTN hx, on Metoprolol and Toprol XL)        Leanne Chang, NP 06/06/11 1626

## 2011-06-03 NOTE — ED Notes (Signed)
To CT via bed.  Mother at [facility] with very sarcastic comments that are ignored by rn

## 2011-06-03 NOTE — Discharge Instructions (Signed)
Please review the instructions below. He was treated for your headache tonight and admit to chewing better. CT of your brain was normal. Your blood work was normal. Your blood pressures were elevated, were from 176/117-141/88. You'll need to arrange follow up with Dr. Clarene Reamer regarding your persistently elevated blood pressures, as it is possible that your medications may need adjusting. Try to have your blood pressure taken 2 or 3 times prior to f/u so Dr Lurene Shadow can she if the elevated blood pressures are a trend. Return for worsening symptoms otherwise follow up as discussed.  Headache  Headaches are caused by many different problems. Most commonly, headache is caused by muscle tension from an injury, fatigue, or emotional upset. Excessive muscle contractions in the scalp and neck result in a headache that often feels like a tight band around the head. Tension headaches often have areas of tenderness over the scalp and the back of the neck. These headaches may last for hours, days, or longer, and some may contribute to migraines in those who have migraine problems. Migraines usually cause a throbbing headache, which is made worse by activity. Sometimes only one side of the head hurts. Nausea, vomiting, eye pain, and avoidance of food are common with migraines. Visual symptoms such as light sensitivity, blind spots, or flashing lights may also occur. Loud noises may worsen migraine headaches. Many factors may cause migraine headaches:  Emotional stress, lack of sleep, and menstrual periods.   Alcohol and some drugs (such as birth control pills).   Diet factors (fasting, caffeine, food preservatives, chocolate).   Environmental factors (weather changes, bright lights, odors, smoke).  Other causes of headaches include minor injuries to the head. Arthritis in the neck; problems with the jaw, eyes, ears, or nose are also causes of headaches. Allergies, drugs, alcohol, and exposure to smoke can also cause  moderate headaches. Rebound headaches can occur if someone uses pain medications for a long period of time and then stops. Less commonly, blood vessel problems in the neck and brain (including stroke) can cause various types of headache. Treatment of headaches includes medicines for pain and relaxation. Ice packs or heat applied to the back of the head and neck help some people. Massaging the shoulders, neck and scalp are often very useful. Relaxation techniques and stretching can help prevent these headaches. Avoid alcohol and cigarette smoking as these tend to make headaches worse. Please see your caregiver if your headache is not better in 2 days.  SEEK IMMEDIATE MEDICAL CARE IF:   You develop a high fever, chills, or repeated vomiting.   You faint or have difficulty with vision.   You develop unusual numbness or weakness of your arms or legs.   Relief of pain is inadequate with medication, or you develop severe pain.   You develop confusion, or neck stiffness.   You have a worsening of a headache or do not obtain relief.  Document Released: 04/04/2005 Document Revised: 12/15/2010 Document Reviewed: 09/28/2006 [facility] Patient Information 2012 [place], [place]  .Hypertension  Hypertension is another name for high blood pressure. High blood pressure may mean that your heart needs to work harder to pump blood. Blood pressure consists of two numbers, which includes a higher number over a lower number (example: 110/72). HOME CARE   Make lifestyle changes as told by your doctor. This may include weight loss and exercise.   Take your blood pressure medicine every day.   Limit how much salt you use.   Stop smoking if you  smoke.   Do not use drugs.   Talk to your doctor if you are using decongestants or birth control pills. These medicines might make blood pressure higher.   Females should not drink more than 1 alcoholic drink per day. Males should not drink more than 2 alcoholic drinks  per day.   See your doctor as told.  GET HELP RIGHT AWAY IF:   You have a blood pressure reading with a top number of 180 or higher.   You get a very bad headache.   You get blurred or changing vision.   You feel confused.   You feel weak, numb, or faint.   You get chest or belly (abdominal) pain.   You throw up (vomit).   You cannot breathe very well.  MAKE SURE YOU:   Understand these instructions.   Will watch your condition.   Will get help right away if you are not doing well or get worse.  Document Released: 09/21/2007 Document Revised: 12/15/2010 Document Reviewed: 09/21/2007 [facility] Patient Information 2012 [place], [place].Hypertension Hypertension is another name for high blood pressure. High blood pressure may mean that your heart needs to work harder to pump blood. Blood pressure consists of two numbers, which includes a higher number over a lower number (example: 110/72). HOME CARE   Make lifestyle changes as told by your doctor. This may include weight loss and exercise.   Take your blood pressure medicine every day.   Limit how much salt you use.   Stop smoking if you smoke.   Do not use drugs.   Talk to your doctor if you are using decongestants or birth control pills. These medicines might make blood pressure higher.   Females should not drink more than 1 alcoholic drink per day. Males should not drink more than 2 alcoholic drinks per day.   See your doctor as told.  GET HELP RIGHT AWAY IF:   You have a blood pressure reading with a top number of 180 or higher.   You get a very bad headache.   You get blurred or changing vision.   You feel confused.   You feel weak, numb, or faint.   You get chest or belly (abdominal) pain.   You throw up (vomit).   You cannot breathe very well.  MAKE SURE YOU:   Understand these instructions.   Will watch your condition.   Will get help right away if you are not doing well or get worse.    Document Released: 09/21/2007 Document Revised: 12/15/2010 Document Reviewed: 09/21/2007 [facility] Patient Information 2012 [place], [place].

## 2011-06-03 NOTE — ED Notes (Signed)
Patient sts that no change in H/A level

## 2011-06-03 NOTE — ED Notes (Signed)
Pt alert, nad, c/o headache, onset a few days ago, speech clear, ambulates to triage

## 2011-06-06 NOTE — ED Provider Notes (Signed)
Medical screening examination/treatment/procedure(s) were performed by non-physician practitioner and as supervising physician I was immediately available for consultation/collaboration.  Lavida Patch, MD 06/06/11 2240 

## 2012-08-19 ENCOUNTER — Encounter (HOSPITAL_COMMUNITY): Payer: Self-pay | Admitting: *Deleted

## 2012-08-19 ENCOUNTER — Emergency Department (HOSPITAL_COMMUNITY)
Admission: EM | Admit: 2012-08-19 | Discharge: 2012-08-19 | Disposition: A | Discharge status: Home / Self Care | Payer: BC Managed Care – PPO | Attending: Emergency Medicine | Admitting: Emergency Medicine

## 2012-08-19 DIAGNOSIS — E079 Disorder of thyroid, unspecified: Secondary | ICD-10-CM | POA: Insufficient documentation

## 2012-08-19 DIAGNOSIS — N760 Acute vaginitis: Secondary | ICD-10-CM

## 2012-08-19 DIAGNOSIS — F3289 Other specified depressive episodes: Secondary | ICD-10-CM | POA: Insufficient documentation

## 2012-08-19 DIAGNOSIS — I10 Essential (primary) hypertension: Secondary | ICD-10-CM

## 2012-08-19 DIAGNOSIS — B9689 Other specified bacterial agents as the cause of diseases classified elsewhere: Secondary | ICD-10-CM | POA: Insufficient documentation

## 2012-08-19 DIAGNOSIS — Z79899 Other long term (current) drug therapy: Secondary | ICD-10-CM | POA: Insufficient documentation

## 2012-08-19 DIAGNOSIS — R51 Headache: Secondary | ICD-10-CM | POA: Insufficient documentation

## 2012-08-19 DIAGNOSIS — Z3202 Encounter for pregnancy test, result negative: Secondary | ICD-10-CM | POA: Insufficient documentation

## 2012-08-19 DIAGNOSIS — F329 Major depressive disorder, single episode, unspecified: Secondary | ICD-10-CM | POA: Insufficient documentation

## 2012-08-19 DIAGNOSIS — F17200 Nicotine dependence, unspecified, uncomplicated: Secondary | ICD-10-CM | POA: Insufficient documentation

## 2012-08-19 DIAGNOSIS — A499 Bacterial infection, unspecified: Secondary | ICD-10-CM | POA: Insufficient documentation

## 2012-08-19 LAB — BASIC METABOLIC PANEL
BUN: 6 mg/dL (ref 6–23)
Calcium: 9.9 mg/dL (ref 8.4–10.5)
Creatinine, Ser: 1.21 mg/dL — ABNORMAL HIGH (ref 0.50–1.10)
GFR calc non Af Amer: 56 mL/min — ABNORMAL LOW (ref >90)
Glucose, Bld: 115 mg/dL — ABNORMAL HIGH (ref 70–99)

## 2012-08-19 LAB — CBC WITH DIFFERENTIAL/PLATELET
Eosinophils Absolute: 0.2 10*3/uL (ref 0.0–0.7)
Eosinophils Relative: 3 % (ref 0–5)
Hemoglobin: 11 g/dL — ABNORMAL LOW (ref 12.0–15.0)
Lymphs Abs: 1.6 10*3/uL (ref 0.7–4.0)
MCH: 30.8 pg (ref 26.0–34.0)
MCV: 89.4 fL (ref 78.0–100.0)
Monocytes Absolute: 0.3 10*3/uL (ref 0.1–1.0)
Monocytes Relative: 6 % (ref 3–12)
RBC: 3.57 MIL/uL — ABNORMAL LOW (ref 3.87–5.11)

## 2012-08-19 LAB — WET PREP, GENITAL: Trich, Wet Prep: NONE SEEN

## 2012-08-19 LAB — URINALYSIS, ROUTINE W REFLEX MICROSCOPIC
Protein, ur: >300 mg/dL — AB
Specific Gravity, Urine: 1.03 (ref 1.005–1.030)
Urobilinogen, UA: 1 mg/dL (ref 0.0–1.0)

## 2012-08-19 LAB — URINE MICROSCOPIC-ADD ON

## 2012-08-19 MED ORDER — MORPHINE SULFATE 4 MG/ML IJ SOLN
6.0000 mg | Freq: Once | INTRAMUSCULAR | Status: AC
  Administered 2012-08-19: 6 mg via INTRAMUSCULAR
  Filled 2012-08-19: qty 2

## 2012-08-19 MED ORDER — METRONIDAZOLE 500 MG PO TABS: 500.0000 mg | ORAL_TABLET | Freq: Two times a day (BID) | ORAL | Status: DC

## 2012-08-19 MED ORDER — METRONIDAZOLE 500 MG PO TABS
500.0000 mg | ORAL_TABLET | Freq: Once | ORAL | Status: AC
  Administered 2012-08-19: 500 mg via ORAL
  Filled 2012-08-19: qty 1

## 2012-08-19 MED ORDER — METOCLOPRAMIDE HCL 5 MG/ML IJ SOLN
10.0000 mg | Freq: Once | INTRAMUSCULAR | Status: AC
  Administered 2012-08-19: 10 mg via INTRAMUSCULAR
  Filled 2012-08-19: qty 2

## 2012-08-19 NOTE — ED Notes (Signed)
Pt states is on medication for high blood pressure but "takes it when she feels like her blood pressure is high".

## 2012-08-19 NOTE — ED Provider Notes (Signed)
History     CSN: [id number]  Arrival date & time 08/19/12  1355   First MD Initiated Contact with Patient 08/19/12 1510      Chief Complaint  Patient presents with  . Vaginal Bleeding  . Headache  . Hypertension     The history is provided by the patient.   patient reports abnormal vaginal bleeding for the past several days.  She's also had a new foul-smelling vaginal discharge over the past 4 days.  She uses a Paediatric nurse for contraception.  No fever chills.  No nausea vomiting or diarrhea.  She also reports mild headache with some photophobia.  She states she has a history of migraine headaches and this feels similar.  She denies weakness of her upper lower extremities.  She also is concerned that her blood pressure was high when she presented the ER 174/110.  She has no chest pain shortness of breath only mild headache.  Her main complaint today is for discharge.  Nothing worsens or improves her pain.  She's not tried any pain medication at home.  Past Medical History  Diagnosis Date  . Hypertension   . Thyroid disease   . Depression     History reviewed. No pertinent past surgical history.  History reviewed. No pertinent family history.  History  Substance Use Topics  . Smoking status: Current Every Day Smoker -- 1.00 packs/day    Types: Cigarettes  . Smokeless tobacco: Never Used  . Alcohol Use: No    OB History   Grav Para Term Preterm Abortions TAB SAB Ect Mult Living                  Review of Systems  Genitourinary: Positive for vaginal bleeding.  Neurological: Positive for headaches.  All other systems reviewed and are negative.    Allergies  Ibuprofen  Home Medications   Current Outpatient Rx  Name  Route  Sig  Dispense  Refill  . DULoxetine (CYMBALTA) 60 MG capsule   Oral   Take 60 mg by mouth daily.         Marland Kitchen etonogestrel-ethinyl estradiol (NUVARING) 0.12-0.015 MG/24HR vaginal ring   Vaginal   Place 1 each vaginally every 28 (twenty-eight)  days. Insert vaginally and leave in place for 3 consecutive weeks, then remove for 1 week.         . levothyroxine (SYNTHROID, LEVOTHROID) 125 MCG tablet   Oral   Take 125 mcg by mouth daily.         Marland Kitchen LORazepam (ATIVAN) 1 MG tablet   Oral   Take 1 mg by mouth every 8 (eight) hours as needed for anxiety.          Marland Kitchen losartan-hydrochlorothiazide (HYZAAR) 100-25 MG per tablet   Oral   Take 1 tablet by mouth daily.         . metoprolol succinate (TOPROL-XL) 50 MG 24 hr tablet   Oral   Take 50 mg by mouth daily. Take with or immediately following a meal.         . metroNIDAZOLE (FLAGYL) 500 MG tablet   Oral   Take 1 tablet (500 mg total) by mouth 2 (two) times daily.   14 tablet   0     BP 174/110  Pulse 87  Temp(Src) 98.2 F (36.8 C) (Oral)  Resp 16  SpO2 100%  Physical Exam  Nursing note and vitals reviewed. Constitutional: She is oriented to person, place, and time. She appears well-developed  and well-nourished. No distress.  HENT:  Head: Normocephalic and atraumatic.  Eyes: EOM are normal. Pupils are equal, round, and reactive to light.  Neck: Normal range of motion.  Cardiovascular: Normal rate, regular rhythm and normal heart sounds.   Pulmonary/Chest: Effort normal and breath sounds normal.  Abdominal: Soft. She exhibits no distension. There is no tenderness.  Genitourinary:  Foul-smelling vaginal discharge.  No cervical motion tenderness.  Nuva Ring removed  Musculoskeletal: Normal range of motion.  Neurological: She is alert and oriented to person, place, and time.  5/5 strength in major muscle groups of  bilateral upper and lower extremities. Speech normal. No facial asymetry.   Skin: Skin is warm and dry.  Psychiatric: She has a normal mood and affect. Judgment normal.    ED Course  Procedures (including critical care time)  Labs Reviewed  WET PREP, GENITAL - Abnormal; Notable for the following:    Clue Cells Wet Prep HPF POC FEW (*)    WBC,  Wet Prep HPF POC FEW (*)    All other components within normal limits  CBC WITH DIFFERENTIAL - Abnormal; Notable for the following:    RBC 3.57 (*)    Hemoglobin 11.0 (*)    HCT 31.9 (*)    All other components within normal limits  BASIC METABOLIC PANEL - Abnormal; Notable for the following:    Potassium 2.9 (*)    Glucose, Bld 115 (*)    Creatinine, Ser 1.21 (*)    GFR calc non Af Amer 56 (*)    GFR calc Af Amer 64 (*)    All other components within normal limits  URINALYSIS, ROUTINE W REFLEX MICROSCOPIC - Abnormal; Notable for the following:    Color, Urine AMBER (*)    APPearance CLOUDY (*)    Hgb urine dipstick LARGE (*)    Bilirubin Urine SMALL (*)    Protein, ur >300 (*)    All other components within normal limits  GC/CHLAMYDIA PROBE AMP  PREGNANCY, URINE  URINE MICROSCOPIC-ADD ON   No results found.   1. BV (bacterial vaginosis)   2. Headache   3. Hypertension       MDM  Patiently treated for bacterial vaginosis.  Her headache is improved.  Normal neurologic exam.  PCP followup regarding her hypertension.  She'll continue her home medications for her blood pressure.  Home with Flagyl for 7 days        Lyanne Co, MD 08/19/12 1827

## 2012-08-19 NOTE — ED Notes (Signed)
Pt states "i just don't feel good", states feels like her blood pressure is high, headache and heavy vaginal bleeding, states unsure of when last cycle was, states has the nuvaring and usually has period when takes it out but it's still in.

## 2012-08-20 LAB — GC/CHLAMYDIA PROBE AMP
CT Probe RNA: NEGATIVE
GC Probe RNA: NEGATIVE

## 2013-01-30 ENCOUNTER — Ambulatory Visit: Payer: Self-pay | Admitting: Obstetrics

## 2013-03-04 ENCOUNTER — Encounter (HOSPITAL_COMMUNITY): Payer: Self-pay | Admitting: Emergency Medicine

## 2013-03-04 ENCOUNTER — Emergency Department (HOSPITAL_COMMUNITY)
Admission: EM | Admit: 2013-03-04 | Discharge: 2013-03-04 | Disposition: A | Discharge status: Home / Self Care | Payer: Medicaid Other | Attending: Emergency Medicine | Admitting: Emergency Medicine

## 2013-03-04 DIAGNOSIS — M2550 Pain in unspecified joint: Secondary | ICD-10-CM | POA: Insufficient documentation

## 2013-03-04 DIAGNOSIS — R634 Abnormal weight loss: Secondary | ICD-10-CM | POA: Insufficient documentation

## 2013-03-04 DIAGNOSIS — R059 Cough, unspecified: Secondary | ICD-10-CM | POA: Insufficient documentation

## 2013-03-04 DIAGNOSIS — Z3202 Encounter for pregnancy test, result negative: Secondary | ICD-10-CM | POA: Insufficient documentation

## 2013-03-04 DIAGNOSIS — R630 Anorexia: Secondary | ICD-10-CM | POA: Insufficient documentation

## 2013-03-04 DIAGNOSIS — K117 Disturbances of salivary secretion: Secondary | ICD-10-CM | POA: Insufficient documentation

## 2013-03-04 DIAGNOSIS — R51 Headache: Secondary | ICD-10-CM | POA: Insufficient documentation

## 2013-03-04 DIAGNOSIS — R6883 Chills (without fever): Secondary | ICD-10-CM | POA: Insufficient documentation

## 2013-03-04 DIAGNOSIS — R209 Unspecified disturbances of skin sensation: Secondary | ICD-10-CM | POA: Insufficient documentation

## 2013-03-04 DIAGNOSIS — F329 Major depressive disorder, single episode, unspecified: Secondary | ICD-10-CM | POA: Insufficient documentation

## 2013-03-04 DIAGNOSIS — E079 Disorder of thyroid, unspecified: Secondary | ICD-10-CM | POA: Insufficient documentation

## 2013-03-04 DIAGNOSIS — N898 Other specified noninflammatory disorders of vagina: Secondary | ICD-10-CM | POA: Insufficient documentation

## 2013-03-04 DIAGNOSIS — J3489 Other specified disorders of nose and nasal sinuses: Secondary | ICD-10-CM | POA: Insufficient documentation

## 2013-03-04 DIAGNOSIS — IMO0001 Reserved for inherently not codable concepts without codable children: Secondary | ICD-10-CM | POA: Insufficient documentation

## 2013-03-04 DIAGNOSIS — D649 Anemia, unspecified: Secondary | ICD-10-CM

## 2013-03-04 DIAGNOSIS — I10 Essential (primary) hypertension: Secondary | ICD-10-CM

## 2013-03-04 DIAGNOSIS — R21 Rash and other nonspecific skin eruption: Secondary | ICD-10-CM | POA: Insufficient documentation

## 2013-03-04 DIAGNOSIS — N179 Acute kidney failure, unspecified: Secondary | ICD-10-CM | POA: Insufficient documentation

## 2013-03-04 DIAGNOSIS — F3289 Other specified depressive episodes: Secondary | ICD-10-CM | POA: Insufficient documentation

## 2013-03-04 DIAGNOSIS — R05 Cough: Secondary | ICD-10-CM | POA: Insufficient documentation

## 2013-03-04 DIAGNOSIS — K5900 Constipation, unspecified: Secondary | ICD-10-CM | POA: Insufficient documentation

## 2013-03-04 DIAGNOSIS — Z79899 Other long term (current) drug therapy: Secondary | ICD-10-CM | POA: Insufficient documentation

## 2013-03-04 DIAGNOSIS — F17200 Nicotine dependence, unspecified, uncomplicated: Secondary | ICD-10-CM | POA: Insufficient documentation

## 2013-03-04 HISTORY — DX: Disorder of kidney and ureter, unspecified: N28.9

## 2013-03-04 LAB — CBC WITH DIFFERENTIAL/PLATELET
Basophils Absolute: 0 10*3/uL (ref 0.0–0.1)
Basophils Relative: 1 % (ref 0–1)
Hemoglobin: 9.7 g/dL — ABNORMAL LOW (ref 12.0–15.0)
MCHC: 34.6 g/dL (ref 30.0–36.0)
MCV: 92.1 fL (ref 78.0–100.0)
Monocytes Absolute: 0.3 10*3/uL (ref 0.1–1.0)
Monocytes Relative: 8 % (ref 3–12)
Neutro Abs: 1.9 10*3/uL (ref 1.7–7.7)
Neutrophils Relative %: 45 % (ref 43–77)
RBC: 3.04 MIL/uL — ABNORMAL LOW (ref 3.87–5.11)
RDW: 13.9 % (ref 11.5–15.5)
WBC: 4.3 10*3/uL (ref 4.0–10.5)

## 2013-03-04 LAB — URINALYSIS W MICROSCOPIC + REFLEX CULTURE
Bilirubin Urine: NEGATIVE
Glucose, UA: NEGATIVE mg/dL
Ketones, ur: NEGATIVE mg/dL
Leukocytes, UA: NEGATIVE
Protein, ur: NEGATIVE mg/dL
Specific Gravity, Urine: 1.014 (ref 1.005–1.030)
pH: 7.5 (ref 5.0–8.0)

## 2013-03-04 LAB — COMPREHENSIVE METABOLIC PANEL
AST: 28 U/L (ref 0–37)
Albumin: 4.9 g/dL (ref 3.5–5.2)
Alkaline Phosphatase: 45 U/L (ref 39–117)
BUN: 9 mg/dL (ref 6–23)
Chloride: 93 mEq/L — ABNORMAL LOW (ref 96–112)
GFR calc non Af Amer: 63 mL/min — ABNORMAL LOW (ref >90)
Potassium: 3.7 mEq/L (ref 3.5–5.1)
Total Bilirubin: 0.6 mg/dL (ref 0.3–1.2)

## 2013-03-04 LAB — POCT PREGNANCY, URINE: Preg Test, Ur: NEGATIVE

## 2013-03-04 LAB — LIPASE, BLOOD: Lipase: 28 U/L (ref 11–59)

## 2013-03-04 NOTE — ED Provider Notes (Signed)
CSN: [id number]     Arrival date & time 03/04/13  1813 History   First MD Initiated Contact with Patient 03/04/13 1856     Chief Complaint  Patient presents with  . Vaginal Bleeding  . Acute Renal Failure  . multiple complaints    (Consider location/radiation/quality/duration/timing/severity/associated sxs/prior Treatment) HPI Jill Hopkins is a 42 y.o. female who presents to emergency department with multiple different complaints. Patient states she has generalized malaise, she feels weak, she has no energy, and states she just saw her doctor who told her that she has had multiple medical issues which he did not explain and it made her concerned. Patient states she was told that she had renal failure, her thyroid was abnormal, her vitamin D was low, her hemoglobin was low. She was started on medications which she has not taken. Patient states that she knows "something is very wrong and I don't want to die and leave my child alone." Patient also reports heavy vaginal bleeding which she has had for years, I asked her she saw her OB/GYN for this, she stated no, and that she did not think of that. Pt also reports weight loss, rash, dry mouth, lips, tingling in hands, yellowing of eyes, constipation, abdominal pain, headaches, anorexia, nasal congestion, cough.   Past Medical History  Diagnosis Date  . Hypertension   . Thyroid disease   . Depression   . Renal disorder    History reviewed. No pertinent past surgical history. History reviewed. No pertinent family history. History  Substance Use Topics  . Smoking status: Current Every Day Smoker -- 1.00 packs/day    Types: Cigarettes  . Smokeless tobacco: Never Used  . Alcohol Use: No   OB History   Grav Para Term Preterm Abortions TAB SAB Ect Mult Living                 Review of Systems  Constitutional: Positive for chills, activity change, appetite change and fatigue. Negative for fever.  HENT: Positive for congestion. Negative for  sore throat.   Respiratory: Positive for cough. Negative for chest tightness and shortness of breath.   Cardiovascular: Negative for chest pain, palpitations and leg swelling.  Gastrointestinal: Positive for abdominal pain. Negative for nausea, vomiting and diarrhea.  Genitourinary: Negative for dysuria, flank pain, vaginal bleeding, vaginal discharge, vaginal pain and pelvic pain.  Musculoskeletal: Positive for arthralgias and myalgias. Negative for neck pain and neck stiffness.  Skin: Positive for rash.  Neurological: Negative for dizziness, weakness and headaches.  All other systems reviewed and are negative.    Allergies  Ibuprofen  Home Medications   Current Outpatient Rx  Name  Route  Sig  Dispense  Refill  . amLODipine (NORVASC) 5 MG tablet   Oral   Take 5 mg by mouth daily.         Marland Kitchen buPROPion (WELLBUTRIN SR) 150 MG 12 hr tablet   Oral   Take 150 mg by mouth 2 (two) times daily.         . ferrous sulfate 325 (65 FE) MG EC tablet   Oral   Take 325 mg by mouth 3 (three) times daily with meals.         Marland Kitchen ketoprofen (ORUDIS) 75 MG capsule   Oral   Take 75 mg by mouth 3 (three) times daily.         Marland Kitchen levothyroxine (SYNTHROID, LEVOTHROID) 125 MCG tablet   Oral   Take 125 mcg by mouth daily.         Marland Kitchen  losartan-hydrochlorothiazide (HYZAAR) 100-25 MG per tablet   Oral   Take 1 tablet by mouth daily.         . nortriptyline (PAMELOR) 25 MG capsule   Oral   Take 25 mg by mouth at bedtime.         . rosuvastatin (CRESTOR) 20 MG tablet   Oral   Take 20 mg by mouth daily.          BP 143/116  Pulse 94  Temp(Src) 98.2 F (36.8 C) (Oral)  Resp 18  SpO2 99%  LMP 02/25/2013 Physical Exam  Nursing note and vitals reviewed. Constitutional: She is oriented to person, place, and time. She appears well-developed and well-nourished. No distress.  HENT:  Head: Normocephalic and atraumatic.  Right Ear: External ear normal.  Left Ear: External ear normal.   Nose: Nose normal.  Mouth/Throat: Oropharynx is clear and moist.  Eyes: Conjunctivae are normal. Pupils are equal, round, and reactive to light. No scleral icterus.  Neck: Normal range of motion. Neck supple.  Cardiovascular: Normal rate, regular rhythm and normal heart sounds.   Pulmonary/Chest: Effort normal and breath sounds normal. No respiratory distress. She has no wheezes. She has no rales.  Abdominal: Soft. Bowel sounds are normal. She exhibits no distension. There is no tenderness. There is no rebound.  Musculoskeletal: She exhibits no edema.  Neurological: She is alert and oriented to person, place, and time.  Skin: Skin is warm and dry.  Psychiatric: She has a normal mood and affect. Her behavior is normal.    ED Course  Procedures (including critical care time) Labs Review Labs Reviewed  CBC WITH DIFFERENTIAL - Abnormal; Notable for the following:    RBC 3.04 (*)    Hemoglobin 9.7 (*)    HCT 28.0 (*)    All other components within normal limits  COMPREHENSIVE METABOLIC PANEL - Abnormal; Notable for the following:    Sodium 134 (*)    Chloride 93 (*)    Glucose, Bld 116 (*)    Total Protein 9.3 (*)    GFR calc non Af Amer 63 (*)    GFR calc Af Amer 73 (*)    All other components within normal limits  URINALYSIS W MICROSCOPIC + REFLEX CULTURE - Abnormal; Notable for the following:    APPearance CLOUDY (*)    Hgb urine dipstick TRACE (*)    All other components within normal limits  LIPASE, BLOOD  POCT PREGNANCY, URINE   Imaging Review No results found.  EKG Interpretation   None       MDM   1. Anemia   2. Hypertension     Patient is here with multiple different complaints, mainly appears anxious about her recent visit to her primary care doctor and receiving multiple new medications. She was given prescription for blood pressure medicine, iron pills, thyroid medicine, and Crestor. I have instructed her to continue taking them. She was also given a print  out which stated she had renal failure. On today's evaluation her BUN and creatinine both normal, her UA appears normal. Have explained to her that today on our examination and blood tests there is no signs of renal failure. I have explained to her our she'll need to followup with her primary care doctor to have that rechecked it was elevated before. Her vital signs are normal except for mildly elevated blood pressure. Will recheck. Patient also did not take her blood pressure medications today, instructed to take as soon as she  gets home. I have also discussed the patient's heavy vaginal menstrual cycles. She was told before she had fibroids. I have explained to her she will need to followup with her GYN doctor if she continues to have heavy bleeding, because it might be why her hemoglobin is low today. Patient voiced understanding in the importance of following up.  Filed Vitals:   03/04/13 1857 03/04/13 2220  BP: 143/116 131/98  Pulse: 94   Temp: 98.2 F (36.8 C)   TempSrc: Oral   Resp: 18   SpO2: 99%        Lottie Mussel, PA-C 03/04/13 2221

## 2013-03-04 NOTE — ED Provider Notes (Signed)
Medical screening examination/treatment/procedure(s) were performed by non-physician practitioner and as supervising physician I was immediately available for consultation/collaboration.  EKG Interpretation   None         Zeeshan Korte H Elanie Hammitt, MD 03/04/13 2253 

## 2013-03-04 NOTE — ED Notes (Signed)
Pt presents to ed with multiple complaints: sts she has been having vaginal bleeding for the last 6 months with lots of clots, also sts she was told today by her pcp that she is in kidney failure. Pt reports: "my iron is completely gone, I have extremely high cholesterol, I have lost at least 60 lbs, I have very high blood pressure, bad rash under my breasts, my thyroid is shutting down completely, I have tingling in my hands" and numerous other complaints.

## 2013-03-04 NOTE — Progress Notes (Signed)
Patient confirmed her pcp is DR. Osei- Bonsu of [facility].  System updated.

## 2013-03-05 ENCOUNTER — Encounter: Payer: Self-pay | Admitting: Obstetrics

## 2013-03-18 ENCOUNTER — Ambulatory Visit (INDEPENDENT_AMBULATORY_CARE_PROVIDER_SITE_OTHER): Payer: Medicaid Other | Admitting: Obstetrics

## 2013-03-18 ENCOUNTER — Encounter: Payer: Self-pay | Admitting: Obstetrics

## 2013-03-18 VITALS — BP 113/79 | HR 70 | Temp 98.4°F | Ht 64.0 in | Wt 192.0 lb

## 2013-03-18 DIAGNOSIS — Z0000 Encounter for general adult medical examination without abnormal findings: Secondary | ICD-10-CM

## 2013-03-18 DIAGNOSIS — Z113 Encounter for screening for infections with a predominantly sexual mode of transmission: Secondary | ICD-10-CM

## 2013-03-18 DIAGNOSIS — B372 Candidiasis of skin and nail: Secondary | ICD-10-CM | POA: Insufficient documentation

## 2013-03-18 DIAGNOSIS — N920 Excessive and frequent menstruation with regular cycle: Secondary | ICD-10-CM | POA: Insufficient documentation

## 2013-03-18 MED ORDER — CLOTRIMAZOLE 1 % EX CREA: 1.0000 "application " | TOPICAL_CREAM | Freq: Two times a day (BID) | CUTANEOUS | Status: DC

## 2013-03-18 NOTE — Progress Notes (Signed)
Subjective:     Jill Hopkins is a 42 y.o. female here for a routine exam.  Current complaints: Patient is in the office for annual exam- patient needs to discuss her bleeding issues. Patient has rash under breast and wants to be checked for BV. Personal health questionnaire reviewed: yes.   Gynecologic History Patient's last menstrual period was 02/25/2013. Contraception: condoms Last Pap: over 1 year. Results were: normal Last mammogram: last year. Results were: normal  Obstetric History OB History  No data available     The following portions of the patient's history were reviewed and updated as appropriate: allergies, current medications, past family history, past medical history, past social history, past surgical history and problem list.  Review of Systems Pertinent items are noted in HPI.    Objective:    General appearance: alert and no distress Breasts: normal appearance, no masses or tenderness Abdomen: normal findings: soft, non-tender Pelvic: cervix normal in appearance, external genitalia normal, no adnexal masses or tenderness, no cervical motion tenderness, rectovaginal septum normal, uterus normal size, shape, and consistency and vagina normal without discharge    Assessment:    Healthy female exam.   AUB  Fungal rash in breast folds.   Plan:    Education reviewed: safe sex/STD prevention, self breast exams and management of AUB. Contraception: condoms. Follow up in: 2 weeks. Ultrasound ordered   Clotrimazole cream Rx Endometrial biopsy next visit.

## 2013-03-19 LAB — WET PREP BY MOLECULAR PROBE
Gardnerella vaginalis: POSITIVE — AB
Trichomonas vaginosis: NEGATIVE

## 2013-03-19 LAB — HEPATITIS B SURFACE ANTIGEN: Hepatitis B Surface Ag: NEGATIVE

## 2013-03-19 LAB — PAP IG W/ RFLX HPV ASCU

## 2013-03-19 LAB — HEPATITIS C ANTIBODY: HCV Ab: NEGATIVE

## 2013-03-19 LAB — HIV ANTIBODY (ROUTINE TESTING W REFLEX): HIV: NONREACTIVE

## 2013-04-01 ENCOUNTER — Ambulatory Visit (INDEPENDENT_AMBULATORY_CARE_PROVIDER_SITE_OTHER): Payer: Medicaid Other | Admitting: Obstetrics

## 2013-04-01 ENCOUNTER — Encounter: Payer: Self-pay | Admitting: Obstetrics

## 2013-04-01 DIAGNOSIS — N760 Acute vaginitis: Secondary | ICD-10-CM

## 2013-04-01 DIAGNOSIS — N920 Excessive and frequent menstruation with regular cycle: Secondary | ICD-10-CM

## 2013-04-01 DIAGNOSIS — B9689 Other specified bacterial agents as the cause of diseases classified elsewhere: Secondary | ICD-10-CM

## 2013-04-01 DIAGNOSIS — A499 Bacterial infection, unspecified: Secondary | ICD-10-CM

## 2013-04-01 MED ORDER — METRONIDAZOLE 500 MG PO TABS: 500.0000 mg | ORAL_TABLET | Freq: Two times a day (BID) | ORAL | Status: DC

## 2013-04-01 NOTE — Progress Notes (Signed)
Pt in office today to f/u lab work. Pt to be scheduled for u/s and return to office for results.  Rx to be sent to pharmacy to treat BV.

## 2013-04-02 ENCOUNTER — Other Ambulatory Visit: Payer: Self-pay | Admitting: Obstetrics

## 2013-04-02 ENCOUNTER — Ambulatory Visit (HOSPITAL_COMMUNITY)
Admission: RE | Admit: 2013-04-02 | Discharge: 2013-04-02 | Disposition: A | Discharge status: Home / Self Care | Payer: Medicaid Other | Source: Ambulatory Visit | Attending: Obstetrics | Admitting: Obstetrics

## 2013-04-02 DIAGNOSIS — N920 Excessive and frequent menstruation with regular cycle: Secondary | ICD-10-CM

## 2013-04-02 DIAGNOSIS — N83209 Unspecified ovarian cyst, unspecified side: Secondary | ICD-10-CM | POA: Insufficient documentation

## 2013-04-02 DIAGNOSIS — N831 Corpus luteum cyst of ovary, unspecified side: Secondary | ICD-10-CM | POA: Insufficient documentation

## 2013-04-09 ENCOUNTER — Ambulatory Visit (INDEPENDENT_AMBULATORY_CARE_PROVIDER_SITE_OTHER): Payer: Medicaid Other | Admitting: Obstetrics

## 2013-04-09 ENCOUNTER — Encounter: Payer: Self-pay | Admitting: Obstetrics

## 2013-04-09 VITALS — BP 154/103 | HR 76 | Temp 96.8°F | Ht 64.0 in | Wt 183.0 lb

## 2013-04-09 DIAGNOSIS — N939 Abnormal uterine and vaginal bleeding, unspecified: Secondary | ICD-10-CM

## 2013-04-09 DIAGNOSIS — I10 Essential (primary) hypertension: Secondary | ICD-10-CM

## 2013-04-09 DIAGNOSIS — N926 Irregular menstruation, unspecified: Secondary | ICD-10-CM

## 2013-04-09 NOTE — Progress Notes (Signed)
Subjective:     Jill Hopkins is a 42 y.o. female here for consultation visit.  Current complaints: follow up appointment. Pt in office to discuss ultrasound results.  Personal health questionnaire reviewed: yes.   Gynecologic History Patient's last menstrual period was 02/25/2013. Contraception: none Last Pap: 2014. Results were: normal Last mammogram: 2013. Results were: normal  Obstetric History OB History  No data available     The following portions of the patient's history were reviewed and updated as appropriate: allergies, current medications, past family history, past medical history, past social history, past surgical history and problem list.  Review of Systems Pertinent items are noted in HPI.    Objective:    No exam performed today, Consult only.    Assessment:    AUB.  Heavy and painful periods.  Hypertension   Plan:    Education reviewed: Management of AUB.Marland Kitchen Contraception: none. Follow up in: several weeks. Considering Mirena IUD.

## 2013-05-07 ENCOUNTER — Other Ambulatory Visit: Payer: Self-pay | Admitting: Obstetrics

## 2014-07-02 ENCOUNTER — Inpatient Hospital Stay (HOSPITAL_COMMUNITY)
Admission: EM | Admit: 2014-07-02 | Discharge: 2014-07-13 | DRG: 683 | Disposition: A | Discharge status: Home / Self Care | Payer: Medicaid Other | Attending: Internal Medicine | Admitting: Internal Medicine

## 2014-07-02 ENCOUNTER — Emergency Department (HOSPITAL_COMMUNITY): Payer: Medicaid Other

## 2014-07-02 ENCOUNTER — Encounter (HOSPITAL_COMMUNITY): Payer: Self-pay | Admitting: Emergency Medicine

## 2014-07-02 DIAGNOSIS — K5909 Other constipation: Secondary | ICD-10-CM | POA: Diagnosis not present

## 2014-07-02 DIAGNOSIS — R55 Syncope and collapse: Secondary | ICD-10-CM | POA: Diagnosis not present

## 2014-07-02 DIAGNOSIS — R7989 Other specified abnormal findings of blood chemistry: Secondary | ICD-10-CM

## 2014-07-02 DIAGNOSIS — D649 Anemia, unspecified: Secondary | ICD-10-CM | POA: Diagnosis present

## 2014-07-02 DIAGNOSIS — R609 Edema, unspecified: Secondary | ICD-10-CM | POA: Diagnosis not present

## 2014-07-02 DIAGNOSIS — E876 Hypokalemia: Secondary | ICD-10-CM | POA: Diagnosis present

## 2014-07-02 DIAGNOSIS — R600 Localized edema: Secondary | ICD-10-CM | POA: Diagnosis present

## 2014-07-02 DIAGNOSIS — T796XXA Traumatic ischemia of muscle, initial encounter: Secondary | ICD-10-CM | POA: Diagnosis not present

## 2014-07-02 DIAGNOSIS — R21 Rash and other nonspecific skin eruption: Secondary | ICD-10-CM | POA: Diagnosis present

## 2014-07-02 DIAGNOSIS — N760 Acute vaginitis: Secondary | ICD-10-CM | POA: Diagnosis present

## 2014-07-02 DIAGNOSIS — R519 Headache, unspecified: Secondary | ICD-10-CM

## 2014-07-02 DIAGNOSIS — E079 Disorder of thyroid, unspecified: Secondary | ICD-10-CM | POA: Diagnosis present

## 2014-07-02 DIAGNOSIS — R569 Unspecified convulsions: Secondary | ICD-10-CM | POA: Diagnosis not present

## 2014-07-02 DIAGNOSIS — F1410 Cocaine abuse, uncomplicated: Secondary | ICD-10-CM | POA: Diagnosis not present

## 2014-07-02 DIAGNOSIS — Y92002 Bathroom of unspecified non-institutional (private) residence single-family (private) house as the place of occurrence of the external cause: Secondary | ICD-10-CM | POA: Diagnosis not present

## 2014-07-02 DIAGNOSIS — M542 Cervicalgia: Secondary | ICD-10-CM | POA: Diagnosis present

## 2014-07-02 DIAGNOSIS — E669 Obesity, unspecified: Secondary | ICD-10-CM | POA: Diagnosis present

## 2014-07-02 DIAGNOSIS — Z6831 Body mass index (BMI) 31.0-31.9, adult: Secondary | ICD-10-CM | POA: Diagnosis not present

## 2014-07-02 DIAGNOSIS — N179 Acute kidney failure, unspecified: Principal | ICD-10-CM | POA: Diagnosis present

## 2014-07-02 DIAGNOSIS — E039 Hypothyroidism, unspecified: Secondary | ICD-10-CM | POA: Diagnosis present

## 2014-07-02 DIAGNOSIS — I129 Hypertensive chronic kidney disease with stage 1 through stage 4 chronic kidney disease, or unspecified chronic kidney disease: Secondary | ICD-10-CM | POA: Diagnosis present

## 2014-07-02 DIAGNOSIS — W1830XA Fall on same level, unspecified, initial encounter: Secondary | ICD-10-CM | POA: Diagnosis present

## 2014-07-02 DIAGNOSIS — N189 Chronic kidney disease, unspecified: Secondary | ICD-10-CM | POA: Diagnosis present

## 2014-07-02 DIAGNOSIS — M79602 Pain in left arm: Secondary | ICD-10-CM | POA: Diagnosis present

## 2014-07-02 DIAGNOSIS — E871 Hypo-osmolality and hyponatremia: Secondary | ICD-10-CM | POA: Diagnosis present

## 2014-07-02 DIAGNOSIS — E873 Alkalosis: Secondary | ICD-10-CM | POA: Diagnosis present

## 2014-07-02 DIAGNOSIS — R945 Abnormal results of liver function studies: Secondary | ICD-10-CM

## 2014-07-02 DIAGNOSIS — Z79899 Other long term (current) drug therapy: Secondary | ICD-10-CM

## 2014-07-02 DIAGNOSIS — R739 Hyperglycemia, unspecified: Secondary | ICD-10-CM | POA: Diagnosis present

## 2014-07-02 DIAGNOSIS — M6282 Rhabdomyolysis: Secondary | ICD-10-CM | POA: Diagnosis present

## 2014-07-02 DIAGNOSIS — Z9119 Patient's noncompliance with other medical treatment and regimen: Secondary | ICD-10-CM | POA: Diagnosis present

## 2014-07-02 DIAGNOSIS — Z87891 Personal history of nicotine dependence: Secondary | ICD-10-CM

## 2014-07-02 DIAGNOSIS — T40605A Adverse effect of unspecified narcotics, initial encounter: Secondary | ICD-10-CM | POA: Diagnosis not present

## 2014-07-02 DIAGNOSIS — F329 Major depressive disorder, single episode, unspecified: Secondary | ICD-10-CM | POA: Diagnosis present

## 2014-07-02 DIAGNOSIS — I10 Essential (primary) hypertension: Secondary | ICD-10-CM | POA: Diagnosis not present

## 2014-07-02 DIAGNOSIS — R079 Chest pain, unspecified: Secondary | ICD-10-CM | POA: Diagnosis present

## 2014-07-02 DIAGNOSIS — R51 Headache: Secondary | ICD-10-CM

## 2014-07-02 DIAGNOSIS — F32A Depression, unspecified: Secondary | ICD-10-CM | POA: Diagnosis present

## 2014-07-02 LAB — CBC WITH DIFFERENTIAL/PLATELET
BASOS ABS: 0 10*3/uL (ref 0.0–0.1)
Basophils Relative: 0 % (ref 0–1)
EOS PCT: 2 % (ref 0–5)
Eosinophils Absolute: 0.1 10*3/uL (ref 0.0–0.7)
HCT: 34 % — ABNORMAL LOW (ref 36.0–46.0)
Hemoglobin: 11.4 g/dL — ABNORMAL LOW (ref 12.0–15.0)
Lymphocytes Relative: 18 % (ref 12–46)
Lymphs Abs: 1 10*3/uL (ref 0.7–4.0)
MCH: 30.9 pg (ref 26.0–34.0)
MCHC: 33.5 g/dL (ref 30.0–36.0)
MCV: 92.1 fL (ref 78.0–100.0)
Monocytes Absolute: 0.3 10*3/uL (ref 0.1–1.0)
Monocytes Relative: 5 % (ref 3–12)
NEUTROS ABS: 4.3 10*3/uL (ref 1.7–7.7)
Neutrophils Relative %: 75 % (ref 43–77)
Platelets: 367 10*3/uL (ref 150–400)
RBC: 3.69 MIL/uL — ABNORMAL LOW (ref 3.87–5.11)
RDW: 13.9 % (ref 11.5–15.5)
WBC: 5.8 10*3/uL (ref 4.0–10.5)

## 2014-07-02 LAB — BASIC METABOLIC PANEL
Anion gap: 12 (ref 5–15)
BUN: 42 mg/dL — ABNORMAL HIGH (ref 6–23)
CO2: 18 mmol/L — ABNORMAL LOW (ref 19–32)
Calcium: 8.2 mg/dL — ABNORMAL LOW (ref 8.4–10.5)
Chloride: 100 mmol/L (ref 96–112)
Creatinine, Ser: 8.84 mg/dL — ABNORMAL HIGH (ref 0.50–1.10)
GFR, EST AFRICAN AMERICAN: 6 mL/min — AB (ref >90)
GFR, EST NON AFRICAN AMERICAN: 5 mL/min — AB (ref >90)
Glucose, Bld: 110 mg/dL — ABNORMAL HIGH (ref 70–99)
POTASSIUM: 3.3 mmol/L — AB (ref 3.5–5.1)
SODIUM: 130 mmol/L — AB (ref 135–145)

## 2014-07-02 LAB — URINALYSIS, ROUTINE W REFLEX MICROSCOPIC
BILIRUBIN URINE: NEGATIVE
GLUCOSE, UA: NEGATIVE mg/dL
Ketones, ur: NEGATIVE mg/dL
LEUKOCYTES UA: NEGATIVE
Nitrite: NEGATIVE
Protein, ur: 30 mg/dL — AB
Specific Gravity, Urine: 1.012 (ref 1.005–1.030)
Urobilinogen, UA: 0.2 mg/dL (ref 0.0–1.0)
pH: 5 (ref 5.0–8.0)

## 2014-07-02 LAB — POC URINE PREG, ED: Preg Test, Ur: NEGATIVE

## 2014-07-02 LAB — WET PREP, GENITAL
TRICH WET PREP: NONE SEEN
Yeast Wet Prep HPF POC: NONE SEEN

## 2014-07-02 LAB — URINE MICROSCOPIC-ADD ON

## 2014-07-02 LAB — RAPID URINE DRUG SCREEN, HOSP PERFORMED
Amphetamines: NOT DETECTED
BARBITURATES: NOT DETECTED
Benzodiazepines: NOT DETECTED
Cocaine: POSITIVE — AB
Opiates: POSITIVE — AB
Tetrahydrocannabinol: NOT DETECTED

## 2014-07-02 LAB — CK: CK TOTAL: 22255 U/L — AB (ref 7–177)

## 2014-07-02 MED ORDER — SODIUM CHLORIDE 0.9 % IV BOLUS (SEPSIS)
1000.0000 mL | Freq: Once | INTRAVENOUS | Status: AC
  Administered 2014-07-02: 1000 mL via INTRAVENOUS

## 2014-07-02 MED ORDER — SODIUM BICARBONATE 8.4 % IV SOLN: INTRAVENOUS | Status: DC

## 2014-07-02 MED ORDER — PANTOPRAZOLE SODIUM 40 MG IV SOLR
40.0000 mg | INTRAVENOUS | Status: DC
  Administered 2014-07-03 (×2): 40 mg via INTRAVENOUS
  Filled 2014-07-02 (×3): qty 40

## 2014-07-02 MED ORDER — SODIUM BICARBONATE 8.4 % IV SOLN
INTRAVENOUS | Status: DC
  Administered 2014-07-02 – 2014-07-03 (×3): via INTRAVENOUS
  Filled 2014-07-02 (×7): qty 850

## 2014-07-02 MED ORDER — SODIUM BICARBONATE 8.4 % IV SOLN
INTRAVENOUS | Status: DC
  Filled 2014-07-02 (×3): qty 850

## 2014-07-02 NOTE — ED Provider Notes (Signed)
CSN: [id number]     Arrival date & time 07/02/14  1544 History   First MD Initiated Contact with Patient 07/02/14 1655     Chief Complaint  Patient presents with  . Fall  . Chest Pain   Bryer Gottsch is a 44 y.o. female with a history of depression, renal disorder, hypertension, hypothyroid, and HSV-2 infection who presents to the ED complaining of a fall four days ago where she laid on the ground for almost 12 hours and complaining of right neck pain, her left arm "not working" and butt pain. She reports that she fell in the bathroom 4 nights ago and woke up on the floor the next morning. She does not remember why she fell, she denies drinking alcohol or using drugs. She reports having lesions to the side of her neck and face after the fall. She complains of pain over her right neck. She denies any current chest pain, but reports she had some earlier. She reports feeling like her hand cannot straighten out. She has taken nothing for treatment today. She denies recent illness. She does not take any medications currently for herpes. She does report some vaginal discharge for 3 weeks. The patient denies fevers, cough, wheezing, shortness of breath, vaginal lesions, vaginal leading, dysuria, hematuria, urinary frequency, urinary urgency, or difficulty walking.   (Consider location/radiation/quality/duration/timing/severity/associated sxs/prior Treatment) HPI  Past Medical History  Diagnosis Date  . Hypertension   . Thyroid disease   . Depression   . Renal disorder    Past Surgical History  Procedure Laterality Date  . Cervical cerclage     History reviewed. No pertinent family history. History  Substance Use Topics  . Smoking status: Former Smoker    Types: Cigarettes    Quit date: 03/04/2013  . Smokeless tobacco: Never Used  . Alcohol Use: No   OB History    No data available     Review of Systems  Constitutional: Negative for fever and chills.  HENT: Negative for congestion,  mouth sores, sore throat and trouble swallowing.        Face sores, neck pain.   Eyes: Negative for pain and visual disturbance.  Respiratory: Negative for cough, shortness of breath and wheezing.   Cardiovascular: Negative for chest pain and palpitations.  Gastrointestinal: Negative for nausea, vomiting, abdominal pain and diarrhea.  Genitourinary: Positive for vaginal discharge. Negative for dysuria, urgency, frequency, hematuria, vaginal bleeding and vaginal pain.  Musculoskeletal: Positive for back pain and neck pain. Negative for gait problem and neck stiffness.  Skin: Negative for rash.  Neurological: Positive for weakness. Negative for light-headedness, numbness and headaches.      Allergies  Ibuprofen  Home Medications   Prior to Admission medications   Medication Sig Start Date End Date Taking? Authorizing Provider  ABILIFY 2 MG tablet Take 2 mg by mouth daily. 04/27/14  Yes Historical Provider, MD  amLODipine (NORVASC) 10 MG tablet Take 10 mg by mouth daily. 04/20/14  Yes Historical Provider, MD  buPROPion (WELLBUTRIN SR) 150 MG 12 hr tablet Take 150 mg by mouth 2 (two) times daily.   Yes Historical Provider, MD  ferrous sulfate 325 (65 FE) MG EC tablet Take 325 mg by mouth daily.    Yes Historical Provider, MD  gabapentin (NEURONTIN) 300 MG capsule Take 300 mg by mouth 2 (two) times daily.   Yes Historical Provider, MD  ketoprofen (ORUDIS) 75 MG capsule Take 75 mg by mouth 3 (three) times daily.   Yes Historical Provider,  MD  levothyroxine (SYNTHROID, LEVOTHROID) 137 MCG tablet Take 137 mcg by mouth daily.   Yes Historical Provider, MD  losartan-hydrochlorothiazide (HYZAAR) 100-25 MG per tablet Take 1 tablet by mouth daily.   Yes Historical Provider, MD  metoprolol succinate (TOPROL-XL) 100 MG 24 hr tablet Take 100 mg by mouth daily. Take with or immediately following a meal.   Yes Historical Provider, MD  nortriptyline (PAMELOR) 25 MG capsule Take 25 mg by mouth at bedtime.    Yes Historical Provider, MD  NUVARING 0.12-0.015 MG/24HR vaginal ring USE AS DIRECTED 05/07/13  Yes Shelly Bombard, MD  rosuvastatin (CRESTOR) 20 MG tablet Take 20 mg by mouth daily.   Yes Historical Provider, MD  zolpidem (AMBIEN CR) 12.5 MG CR tablet Take 12.5 mg by mouth at bedtime. 04/20/14  Yes Historical Provider, MD  clotrimazole (LOTRIMIN) 1 % cream Apply 1 application topically 2 (two) times daily. 03/18/13   Shelly Bombard, MD   BP 132/74 mmHg  Pulse 62  Temp(Src) 97.6 F (36.4 C) (Oral)  Resp 18  Ht 5\' 4"  (1.626 m)  Wt 183 lb (83.008 kg)  BMI 31.40 kg/m2  SpO2 100%  LMP 06/03/2014 (Approximate) Physical Exam  Constitutional: She is oriented to person, place, and time. She appears well-developed and well-nourished. No distress.  HENT:  Head: Normocephalic and atraumatic.  Right Ear: External ear normal.  Left Ear: External ear normal.  Nose: Nose normal.  Mouth/Throat: Oropharynx is clear and moist. No oropharyngeal exudate.  Mucous membranes dry. No lesions in mouth. Old lesion to left lip that is painful.   Eyes: Conjunctivae and EOM are normal. Pupils are equal, round, and reactive to light. Right eye exhibits no discharge. Left eye exhibits no discharge.  Neck: Normal range of motion. Neck supple. No JVD present. No tracheal deviation present.  Vesicles noted to right lateral neck that are tender and filled with clear fluid. Patient is able to touch her chin to her chest and rotate her head in greater than 45 degrees in each direction.   Cardiovascular: Normal rate, regular rhythm, normal heart sounds and intact distal pulses.  Exam reveals no gallop and no friction rub.   No murmur heard. Pulmonary/Chest: Effort normal and breath sounds normal. No respiratory distress. She has no wheezes. She has no rales.  Abdominal: Soft. Bowel sounds are normal. She exhibits no distension and no mass. There is no tenderness. There is no rebound and no guarding.  Genitourinary:   Pelvic exam performed by me with female RN chaperone. There are no external lesions noted. The patient has a mild amount of white vaginal discharge. Patient's cervix is closed and nontender. No cervical motion tenderness. No adnexal fullness or tenderness. Uterus is nontender.  Musculoskeletal: She exhibits no edema.  Patient is unable to extend her left wrist. Good and equal grip strengths.   Lymphadenopathy:    She has no cervical adenopathy.  Neurological: She is alert and oriented to person, place, and time. No cranial nerve deficit. Coordination normal.  Cranial nerves are intact. EOMs intact bilaterally. No pronator drift. Patient able to ambulate without difficulty or assistance. Strength is 5 out of 5 in her bilateral upper and lower extremities. Sensation intact in her bilateral upper and lower extremities. Good and equal grip strength bilaterally. Finger-to-nose intact bilaterally.  Skin: Skin is warm and dry. No rash noted. She is not diaphoretic. No erythema. No pallor.  Psychiatric: She has a normal mood and affect. Her behavior is normal.  Nursing  note and vitals reviewed.   ED Course  Procedures (including critical care time) Labs Review Labs Reviewed  WET PREP, GENITAL - Abnormal; Notable for the following:    Clue Cells Wet Prep HPF POC FEW (*)    WBC, Wet Prep HPF POC FEW (*)    All other components within normal limits  BASIC METABOLIC PANEL - Abnormal; Notable for the following:    Sodium 130 (*)    Potassium 3.3 (*)    CO2 18 (*)    Glucose, Bld 110 (*)    BUN 42 (*)    Creatinine, Ser 8.84 (*)    Calcium 8.2 (*)    GFR calc non Af Amer 5 (*)    GFR calc Af Amer 6 (*)    All other components within normal limits  CBC WITH DIFFERENTIAL/PLATELET - Abnormal; Notable for the following:    RBC 3.69 (*)    Hemoglobin 11.4 (*)    HCT 34.0 (*)    All other components within normal limits  CK - Abnormal; Notable for the following:    Total CK 22255 (*)    All other  components within normal limits  URINE RAPID DRUG SCREEN (HOSP PERFORMED) - Abnormal; Notable for the following:    Opiates POSITIVE (*)    Cocaine POSITIVE (*)    All other components within normal limits  URINALYSIS, ROUTINE W REFLEX MICROSCOPIC - Abnormal; Notable for the following:    APPearance CLOUDY (*)    Hgb urine dipstick LARGE (*)    Protein, ur 30 (*)    All other components within normal limits  URINE MICROSCOPIC-ADD ON - Abnormal; Notable for the following:    Squamous Epithelial / LPF FEW (*)    Bacteria, UA FEW (*)    All other components within normal limits  HIV ANTIBODY (ROUTINE TESTING)  RPR  BASIC METABOLIC PANEL  CK  MAGNESIUM  VARICELLA-ZOSTER BY PCR  BASIC METABOLIC PANEL  CBC  POC URINE PREG, ED  GC/CHLAMYDIA PROBE AMP ([place])    Imaging Review Mr Brain Wo Contrast  07/02/2014   CLINICAL DATA:  [place] 4 days ago and unconscious for 12 hours. Now with neck pain. Left radial nerve palsy  EXAM: MRI HEAD WITHOUT CONTRAST  TECHNIQUE: Multiplanar, multiecho pulse sequences of the brain and surrounding structures were obtained without intravenous contrast.  COMPARISON:  CT head 06/03/2011  FINDINGS: Cerebellar tonsils extend 8 mm below the foramen magnum compatible with mild Chiari malformation. Negative for hydrocephalus. Pituitary normal in size.  Negative for acute infarct.  Negative for chronic ischemia.  Negative for demyelinating disease. Cerebral white matter normal. Brainstem and basal ganglia normal  Negative for intracranial hemorrhage or fluid collection.  Negative for mass or edema.  No shift of the midline structures.  Mild mucosal edema paranasal sinuses  There is extensive edema in the erector spinae muscles of the right posterior neck below the occiput. This is unilateral and involves all of the muscles. This may reach related to acute muscle injury from the patient's fall or subsequent positioning while unconscious. This may lead to rhabdomyolysis.   IMPRESSION: Mild Chiari malformation.  No acute intracranial abnormality.  Extensive edema in the erector spinae muscles in the right neck, worrisome for acute muscle injury. This may lead to rhabdomyolysis.   Electronically Signed   By: Franchot Gallo M.D.   On: 07/02/2014 20:58   Mr Cervical Spine Wo Contrast  07/02/2014   CLINICAL DATA:  Fall 4 days  ago. Unconscious 12 hours. Neck pain. Left radial nerve palsy.  EXAM: MRI CERVICAL SPINE WITHOUT CONTRAST  TECHNIQUE: Multiplanar, multisequence MR imaging of the cervical spine was performed. No intravenous contrast was administered.  COMPARISON:  MRI of the brain today  FINDINGS: Image quality is diminished due to difficulty positioning the patient in the coil.  Extensive edema in the erector spinae muscles on the right below the occiput and extending into the upper thoracic spine. The lower extent of this abnormality is not imaged. There is some edema in the medial trapezius muscle on the right. No well-defined fluid collection or hematoma is identified. The muscle bundles are enlarged.  Negative for fracture. No bone marrow edema identified. No mass lesion.  Mild Chiari malformation.  Cervical spine normal alignment  C2-3:  Negative  C3-4: Mild uncinate spurring on the left and mild left facet hypertrophy causing left foraminal narrowing. No cord deformity  C4-5: Mild disc and facet degeneration and spurring. Mild left foraminal narrowing  C5-6: Moderately large central disc protrusion and associated osteophyte with a chronic appearance. There is moderate spinal stenosis with cord flattening. No cord signal abnormality.  C6-7:  Mild degenerative change  C7-T1:  Negative  IMPRESSION: Extensive edema throughout the right erector spinae muscles extending from the occiput to the upper thoracic spine and right medial trapezius muscle. No focal hematoma. Left-sided muscles are normal. No bone marrow edema. Findings are most likely due to muscle edema and possibly  muscle necrosis. No evidence of hematoma or abscess.  Negative for fracture  Mild Chiari malformation  Chronic central disc protrusion and spurring at C5-6 with moderate spinal stenosis. No cord injury at this level.  These results were called by telephone at the time of interpretation on 07/02/2014 at 9:22 pm to Dr. Winfred Leeds, who verbally acknowledged these results.   Electronically Signed   By: Franchot Gallo M.D.   On: 07/02/2014 21:24     EKG Interpretation   Date/Time:  Wednesday July 02 2014 16:01:08 EDT Ventricular Rate:  58 PR Interval:  177 QRS Duration: 90 QT Interval:  438 QTC Calculation: 430 R Axis:   43 Text Interpretation:  Sinus rhythm Low voltage, precordial leads No old  tracing to compare Confirmed by JACUBOWITZ  MD, SAM [phone]) on 07/02/2014  4:07:52 PM      Filed Vitals:   07/02/14 2300 07/03/14 0032 07/03/14 0110 07/03/14 0121  BP: 131/84 114/66 132/74   Pulse: 64 139 62   Temp:   97.6 F (36.4 C)   TempSrc:   Oral   Resp:   18   Height:    5\' 4"  (1.626 m)  Weight:    183 lb (83.008 kg)  SpO2: 100% 100% 100%      MDM   Final diagnoses:  Acute renal failure, unspecified acute renal failure type  #2: Rhabdomyolysis.    This is a 44 y.o. female with a history of depression, renal disorder, hypertension, hypothyroid, and HSV-2 infection who presents to the ED complaining of a fall four days ago where she laid on the ground for almost 12 hours and complaining of right neck pain, her left arm "not working" and butt pain. She reports that she fell in the bathroom 4 nights ago and woke up on the floor the next morning. She does not remember why she fell, she denies drinking alcohol or using drugs. Patient is afebrile. She has tenderness over her right lateral neck with vesicles noted over her right lateral neck. Her  neck is supple and she is able to move in all directions without difficulty. The patient is unable to extend her left wrist consistent with a radial  nerve palsy of her left arm. Since median and ulnar nerves are intact. She has no other focal neuro deficits otherwise. Pelvic exam was performed due to the patient reporting vaginal discharge for 3 weeks. The patient had no external lesions. She had a moderate amount of white vaginal discharge. Wet prep returned with few clue cells and few white blood cells. The patient's CK returned at 22,255. She has a creatinine of 8.84 which is acute kidney failure as her previous creatinine was normal. She has a bicarbonate of 18, and BUN 42. Her urine drug screen is positive for opiates and cocaine. She had not been provided any opiates prior to this urine drug screen in the ED. The patient denies any illicit drug use after questioning again. The patient's urinalysis shows proteinuria. She is a negative urine pregnancy test. The patient has acute renal failure and is in rhabdomyolysis. This likely the cause of her pain in her muscles and neck. MRI shows a mild Chiari malformation but no otherwise acute intracranial abnormality. It does show extensive edema in the erector spinae muscles of her right neck which is concerning for rhabdomyolysis. The radiologist discussed these MRIs with my attending Dr. Zachery Dakins.  Patient started on fluids with sodium bicarbonate to keep urine alkaline. Will consult with Nephrology and hospitalist for admission.   Consulted with Dr. Mercy Covalt from Nephrology who suggests isotonic bicarb and hydration and consult as needed tomorrow if her creatine does not improve. He says no need for emergent dialysis at this point.  Consulted with Hospitalist Dr. Arnoldo Morale who accepts this patient for admission. The patient is in agreement with admission.   This patient was discussed with and evaluated by Dr. Winfred Leeds who agrees with assessment and plan.     Waynetta Pean, PA-C 07/03/14 0200  Orlie Dakin, MD 07/13/14 1455

## 2014-07-02 NOTE — ED Provider Notes (Signed)
11:45 PM patient is resting comfortably after treatment with intravenous fluids. I discussed the MRIs at length with radiologist. Neck muscle abnormalities Felt secondary to rhabdomyolysis patient in renal failure likely is secondary to rhabdomyolysis. Rhabdomyolysis likely secondary to lying still in one place for prolonged period of time and substance abuse. She will be transferred to [facility] in case she needs hemodialysis. Renal service Dr. Mercy Twersky has been consulted. Pharmacist recommends 67M sodium bicarbonate and 1 L of D5W to start at 200 mL/h in order to keep urine alkaline. Results for orders placed or performed during the hospital encounter of 07/02/14  Wet prep, genital  Result Value Ref Range   Yeast Wet Prep HPF POC NONE SEEN NONE SEEN   Trich, Wet Prep NONE SEEN NONE SEEN   Clue Cells Wet Prep HPF POC FEW (A) NONE SEEN   WBC, Wet Prep HPF POC FEW (A) NONE SEEN  Basic metabolic panel  Result Value Ref Range   Sodium 130 (L) 135 - 145 mmol/L   Potassium 3.3 (L) 3.5 - 5.1 mmol/L   Chloride 100 96 - 112 mmol/L   CO2 18 (L) 19 - 32 mmol/L   Glucose, Bld 110 (H) 70 - 99 mg/dL   BUN 42 (H) 6 - 23 mg/dL   Creatinine, Ser 8.84 (H) 0.50 - 1.10 mg/dL   Calcium 8.2 (L) 8.4 - 10.5 mg/dL   GFR calc non Af Amer 5 (L) >90 mL/min   GFR calc Af Amer 6 (L) >90 mL/min   Anion gap 12 5 - 15  CBC with Differential  Result Value Ref Range   WBC 5.8 4.0 - 10.5 K/uL   RBC 3.69 (L) 3.87 - 5.11 MIL/uL   Hemoglobin 11.4 (L) 12.0 - 15.0 g/dL   HCT 34.0 (L) 36.0 - 46.0 %   MCV 92.1 78.0 - 100.0 fL   MCH 30.9 26.0 - 34.0 pg   MCHC 33.5 30.0 - 36.0 g/dL   RDW 13.9 11.5 - 15.5 %   Platelets 367 150 - 400 K/uL   Neutrophils Relative % 75 43 - 77 %   Neutro Abs 4.3 1.7 - 7.7 K/uL   Lymphocytes Relative 18 12 - 46 %   Lymphs Abs 1.0 0.7 - 4.0 K/uL   Monocytes Relative 5 3 - 12 %   Monocytes Absolute 0.3 0.1 - 1.0 K/uL   Eosinophils Relative 2 0 - 5 %   Eosinophils Absolute 0.1 0.0 - 0.7  K/uL   Basophils Relative 0 0 - 1 %   Basophils Absolute 0.0 0.0 - 0.1 K/uL  CK  Result Value Ref Range   Total CK 22255 (H) 7 - 177 U/L  Drug screen panel, emergency  Result Value Ref Range   Opiates POSITIVE (A) NONE DETECTED   Cocaine POSITIVE (A) NONE DETECTED   Benzodiazepines NONE DETECTED NONE DETECTED   Amphetamines NONE DETECTED NONE DETECTED   Tetrahydrocannabinol NONE DETECTED NONE DETECTED   Barbiturates NONE DETECTED NONE DETECTED  Urinalysis, Routine w reflex microscopic  Result Value Ref Range   Color, Urine YELLOW YELLOW   APPearance CLOUDY (A) CLEAR   Specific Gravity, Urine 1.012 1.005 - 1.030   pH 5.0 5.0 - 8.0   Glucose, UA NEGATIVE NEGATIVE mg/dL   Hgb urine dipstick LARGE (A) NEGATIVE   Bilirubin Urine NEGATIVE NEGATIVE   Ketones, ur NEGATIVE NEGATIVE mg/dL   Protein, ur 30 (A) NEGATIVE mg/dL   Urobilinogen, UA 0.2 0.0 - 1.0 mg/dL   Nitrite  NEGATIVE NEGATIVE   Leukocytes, UA NEGATIVE NEGATIVE  Urine microscopic-add on  Result Value Ref Range   Squamous Epithelial / LPF FEW (A) RARE   WBC, UA 0-2 <3 WBC/hpf   RBC / HPF 0-2 <3 RBC/hpf   Bacteria, UA FEW (A) RARE  POC urine preg, ED  Result Value Ref Range   Preg Test, Ur NEGATIVE NEGATIVE   Mr Brain Wo Contrast  07/02/2014   CLINICAL DATA:  [place] 4 days ago and unconscious for 12 hours. Now with neck pain. Left radial nerve palsy  EXAM: MRI HEAD WITHOUT CONTRAST  TECHNIQUE: Multiplanar, multiecho pulse sequences of the brain and surrounding structures were obtained without intravenous contrast.  COMPARISON:  CT head 06/03/2011  FINDINGS: Cerebellar tonsils extend 8 mm below the foramen magnum compatible with mild Chiari malformation. Negative for hydrocephalus. Pituitary normal in size.  Negative for acute infarct.  Negative for chronic ischemia.  Negative for demyelinating disease. Cerebral white matter normal. Brainstem and basal ganglia normal  Negative for intracranial hemorrhage or fluid collection.   Negative for mass or edema.  No shift of the midline structures.  Mild mucosal edema paranasal sinuses  There is extensive edema in the erector spinae muscles of the right posterior neck below the occiput. This is unilateral and involves all of the muscles. This may reach related to acute muscle injury from the patient's fall or subsequent positioning while unconscious. This may lead to rhabdomyolysis.  IMPRESSION: Mild Chiari malformation.  No acute intracranial abnormality.  Extensive edema in the erector spinae muscles in the right neck, worrisome for acute muscle injury. This may lead to rhabdomyolysis.   Electronically Signed   By: Franchot Gallo M.D.   On: 07/02/2014 20:58   Mr Cervical Spine Wo Contrast  07/02/2014   CLINICAL DATA:  Fall 4 days ago. Unconscious 12 hours. Neck pain. Left radial nerve palsy.  EXAM: MRI CERVICAL SPINE WITHOUT CONTRAST  TECHNIQUE: Multiplanar, multisequence MR imaging of the cervical spine was performed. No intravenous contrast was administered.  COMPARISON:  MRI of the brain today  FINDINGS: Image quality is diminished due to difficulty positioning the patient in the coil.  Extensive edema in the erector spinae muscles on the right below the occiput and extending into the upper thoracic spine. The lower extent of this abnormality is not imaged. There is some edema in the medial trapezius muscle on the right. No well-defined fluid collection or hematoma is identified. The muscle bundles are enlarged.  Negative for fracture. No bone marrow edema identified. No mass lesion.  Mild Chiari malformation.  Cervical spine normal alignment  C2-3:  Negative  C3-4: Mild uncinate spurring on the left and mild left facet hypertrophy causing left foraminal narrowing. No cord deformity  C4-5: Mild disc and facet degeneration and spurring. Mild left foraminal narrowing  C5-6: Moderately large central disc protrusion and associated osteophyte with a chronic appearance. There is moderate spinal  stenosis with cord flattening. No cord signal abnormality.  C6-7:  Mild degenerative change  C7-T1:  Negative  IMPRESSION: Extensive edema throughout the right erector spinae muscles extending from the occiput to the upper thoracic spine and right medial trapezius muscle. No focal hematoma. Left-sided muscles are normal. No bone marrow edema. Findings are most likely due to muscle edema and possibly muscle necrosis. No evidence of hematoma or abscess.  Negative for fracture  Mild Chiari malformation  Chronic central disc protrusion and spurring at C5-6 with moderate spinal stenosis. No cord injury at this level.  These results were called by telephone at the time of interpretation on 07/02/2014 at 9:22 pm to Dr. Winfred Leeds, who verbally acknowledged these results.   Electronically Signed   By: Franchot Gallo M.D.   On: 07/02/2014 21:24      Orlie Dakin, MD 07/02/14 2353

## 2014-07-02 NOTE — ED Notes (Signed)
Pt reports falling on Saturday. Hit neck, face and "everything, clearly. My butt hurts, my neck hurts, everything hurts." Has not had anything for pain today. Everytime I'm doing anything I start sweating and get dizzy and SOB. Patient is in NAD. Speaking full/clear sentences. RR even/unlabored.

## 2014-07-02 NOTE — H&P (Addendum)
[facility] Admission History and Physical       Jill Hopkins IPJ:[id number] DOB: 14-Oct-1970 DOA: 07/02/2014  Referring physician:  PCP: Jill Mccreedy, MD  Specialists:   Chief Complaint: [facility] in [place] and Woke up Next Day  HPI: Jill Hopkins is a 44 y.o. female with a history of HTN, Hypothyroid, CRI, and Depression who presents to the ED with complaints of pain all over for the past 3 days.  She reports that she fell in her bathroom 5 days ago Saturday night, and she reports that she woke up the next AM on Sunday, and did not know what happened.   She reports that she has had increased pain in her neck, and in her left arm, and has had difficulty moving her left arm.   She was evaluated in the ED and had an MRI of the Brain and C-Spine which were negative for acute findings.      Review of Systems:  Constitutional: No Weight Loss, No Weight Gain, Night Sweats, Fevers, Chills, Dizziness, Myalgias,  Light Headedness, Fatigue, or Generalized Weakness HEENT: No Headaches, Difficulty Swallowing,Tooth/Dental Problems,Sore Throat,  No Sneezing, Rhinitis, Ear Ache, Nasal Congestion, or Post Nasal Drip,  Cardio-vascular:  No Chest pain, Orthopnea, PND, Edema in Lower Extremities, Anasarca, Dizziness, Palpitations  Resp: No Dyspnea, No DOE, No Productive Cough, No Non-Productive Cough, No Hemoptysis, No Wheezing.    GI: No Heartburn, Indigestion, Abdominal Pain, Nausea, Vomiting, Diarrhea, Constipation, Hematemesis, Hematochezia, Melena, Change in Bowel Habits,  Loss of Appetite  GU: No Dysuria, No Change in Color of Urine, No Urgency or Urinary Frequency, No Flank pain.  Musculoskeletal: No Joint Pain or Swelling, No Decreased Range of Motion, No Back Pain.  Neurologic:  +Syncope, No Seizures, Muscle Weakness, Paresthesia, Vision Disturbance or Loss, No Diplopia, No Vertigo, No Difficulty Walking,  Skin: No Rash or Lesions. Psych: No Change in Mood or Affect, No Depression or  Anxiety, No Memory loss, No Confusion, or Hallucinations   Past Medical History  Diagnosis Date  . Hypertension   . Thyroid disease   . Depression   . Renal disorder      Past Surgical History  Procedure Laterality Date  . Cervical cerclage        Prior to Admission medications   Medication Sig Start Date End Date Taking? Authorizing Provider  ABILIFY 2 MG tablet Take 2 mg by mouth daily. 04/27/14  Yes Historical Provider, MD  amLODipine (NORVASC) 10 MG tablet Take 10 mg by mouth daily. 04/20/14  Yes Historical Provider, MD  buPROPion (WELLBUTRIN SR) 150 MG 12 hr tablet Take 150 mg by mouth 2 (two) times daily.   Yes Historical Provider, MD  ferrous sulfate 325 (65 FE) MG EC tablet Take 325 mg by mouth daily.    Yes Historical Provider, MD  gabapentin (NEURONTIN) 300 MG capsule Take 300 mg by mouth 2 (two) times daily.   Yes Historical Provider, MD  ketoprofen (ORUDIS) 75 MG capsule Take 75 mg by mouth 3 (three) times daily.   Yes Historical Provider, MD  levothyroxine (SYNTHROID, LEVOTHROID) 137 MCG tablet Take 137 mcg by mouth daily.   Yes Historical Provider, MD  losartan-hydrochlorothiazide (HYZAAR) 100-25 MG per tablet Take 1 tablet by mouth daily.   Yes Historical Provider, MD  metoprolol succinate (TOPROL-XL) 100 MG 24 hr tablet Take 100 mg by mouth daily. Take with or immediately following a meal.   Yes Historical Provider, MD  nortriptyline (PAMELOR) 25 MG capsule Take 25 mg by mouth at  bedtime.   Yes Historical Provider, MD  NUVARING 0.12-0.015 MG/24HR vaginal ring USE AS DIRECTED 05/07/13  Yes Shelly Bombard, MD  rosuvastatin (CRESTOR) 20 MG tablet Take 20 mg by mouth daily.   Yes Historical Provider, MD  zolpidem (AMBIEN CR) 12.5 MG CR tablet Take 12.5 mg by mouth at bedtime. 04/20/14  Yes Historical Provider, MD  clotrimazole (LOTRIMIN) 1 % cream Apply 1 application topically 2 (two) times daily. 03/18/13   Shelly Bombard, MD     Allergies  Allergen Reactions  .  Ibuprofen Nausea And Vomiting    Social History:  reports that she quit smoking about 15 months ago. Her smoking use included Cigarettes. She has never used smokeless tobacco. She reports that she does not drink alcohol or use illicit drugs.    History reviewed. No pertinent family history.     Physical Exam:  GEN:  Pleasant Obese 44 y.o. African American  female examined and in no acute distress; cooperative with exam Filed Vitals:   07/02/14 1557 07/02/14 1854 07/02/14 2129 07/02/14 2129  BP: 143/94 139/84 123/86 123/86  Pulse: 62 64 86 67  Temp: 98.1 F (36.7 C) 98.1 F (36.7 C)    TempSrc: Oral     Resp: 17 18 20 18   SpO2: 100% 97% 98% 99%   Blood pressure 123/86, pulse 67, temperature 98.1 F (36.7 C), temperature source Oral, resp. rate 18, last menstrual period 06/03/2014, SpO2 99 %. PSYCH: She is alert and oriented x4; does not appear anxious does not appear depressed; affect is normal HEENT: Normocephalic and Atraumatic, Mucous membranes pink; PERRLA; EOM intact; Fundi:  Benign;  No scleral icterus, Nares: Patent, Oropharynx: Clear, Fair Dentition,    Neck:  FROM, No Cervical Lymphadenopathy nor Thyromegaly or Carotid Bruit; No JVD; Breasts:: Not examined CHEST WALL: No tenderness CHEST: Normal respiration, clear to auscultation bilaterally HEART: Regular rate and rhythm; no murmurs rubs or gallops BACK: No kyphosis or scoliosis; No CVA tenderness ABDOMEN: Positive Bowel Sounds, Obese, Soft Non-Tender, No Rebound or Guarding; No Masses, No Organomegaly. Rectal Exam: Not done EXTREMITIES: No Cyanosis, Clubbing, or Edema; No Ulcerations. Genitalia: not examined PULSES: 2+ and symmetric SKIN: Normal hydration no rash or ulceration  CNS:  Alert and Oriented x 4 Mental Status:  Alert, Oriented, Thought Content Appropriate. Speech Fluent without evidence of Aphasia. Able to follow 3 step commands without difficulty.  In No obvious pain.   Cranial Nerves:  II: Discs flat  bilaterally; Visual fields Intact, Pupils equal and reactive.    III,IV, VI: Extra-ocular motions intact bilaterally    V,VII: smile symmetric, facial light touch sensation normal bilaterally    VIII: hearing intact bilaterally    IX,X: gag reflex present    XI: bilateral shoulder shrug    XII: midline tongue extension   Motor:  Right:  Upper extremity 5/5     Left:  Upper extremity 5-/5     Right:  Lower extremity 5/5    Left:  Lower extremity 5/5     Tone and Bulk:  normal tone throughout; no atrophy noted   Sensory:  Pinprick and light touch intact throughout, bilaterally   Deep Tendon Reflexes: 2+ and symmetric throughout   Plantars/ Babinski:  Right:normal        Left: normal    Cerebellar:  Finger to nose without difficulty.   Gait: deferred    Vascular: pulses palpable throughout    Labs on Admission:  Basic Metabolic Panel:  Recent Labs Lab  07/02/14 1758  NA 130*  K 3.3*  CL 100  CO2 18*  GLUCOSE 110*  BUN 42*  CREATININE 8.84*  CALCIUM 8.2*   Liver Function Tests: No results for input(s): AST, ALT, ALKPHOS, BILITOT, PROT, ALBUMIN in the last 168 hours. No results for input(s): LIPASE, AMYLASE in the last 168 hours. No results for input(s): AMMONIA in the last 168 hours. CBC:  Recent Labs Lab 07/02/14 1758  WBC 5.8  NEUTROABS 4.3  HGB 11.4*  HCT 34.0*  MCV 92.1  PLT 367   Cardiac Enzymes:  Recent Labs Lab 07/02/14 1758  CKTOTAL 33007*    BNP (last 3 results) No results for input(s): BNP in the last 8760 hours.  ProBNP (last 3 results) No results for input(s): PROBNP in the last 8760 hours.  CBG: No results for input(s): GLUCAP in the last 168 hours.  Radiological Exams on Admission: Mr Brain Wo Contrast  07/02/2014   CLINICAL DATA:  [facility] 4 days ago and unconscious for 12 hours. Now with neck pain. Left radial nerve palsy  EXAM: MRI HEAD WITHOUT CONTRAST  TECHNIQUE: Multiplanar, multiecho pulse sequences of the brain and surrounding  structures were obtained without intravenous contrast.  COMPARISON:  CT head 06/03/2011  FINDINGS: Cerebellar tonsils extend 8 mm below the foramen magnum compatible with mild Chiari malformation. Negative for hydrocephalus. Pituitary normal in size.  Negative for acute infarct.  Negative for chronic ischemia.  Negative for demyelinating disease. Cerebral white matter normal. Brainstem and basal ganglia normal  Negative for intracranial hemorrhage or fluid collection.  Negative for mass or edema.  No shift of the midline structures.  Mild mucosal edema paranasal sinuses  There is extensive edema in the erector spinae muscles of the right posterior neck below the occiput. This is unilateral and involves all of the muscles. This may reach related to acute muscle injury from the patient's fall or subsequent positioning while unconscious. This may lead to rhabdomyolysis.  IMPRESSION: Mild Chiari malformation.  No acute intracranial abnormality.  Extensive edema in the erector spinae muscles in the right neck, worrisome for acute muscle injury. This may lead to rhabdomyolysis.   Electronically Signed   By: Franchot Gallo M.D.   On: 07/02/2014 20:58   Mr Cervical Spine Wo Contrast  07/02/2014   CLINICAL DATA:  Fall 4 days ago. Unconscious 12 hours. Neck pain. Left radial nerve palsy.  EXAM: MRI CERVICAL SPINE WITHOUT CONTRAST  TECHNIQUE: Multiplanar, multisequence MR imaging of the cervical spine was performed. No intravenous contrast was administered.  COMPARISON:  MRI of the brain today  FINDINGS: Image quality is diminished due to difficulty positioning the patient in the coil.  Extensive edema in the erector spinae muscles on the right below the occiput and extending into the upper thoracic spine. The lower extent of this abnormality is not imaged. There is some edema in the medial trapezius muscle on the right. No well-defined fluid collection or hematoma is identified. The muscle bundles are enlarged.  Negative  for fracture. No bone marrow edema identified. No mass lesion.  Mild Chiari malformation.  Cervical spine normal alignment  C2-3:  Negative  C3-4: Mild uncinate spurring on the left and mild left facet hypertrophy causing left foraminal narrowing. No cord deformity  C4-5: Mild disc and facet degeneration and spurring. Mild left foraminal narrowing  C5-6: Moderately large central disc protrusion and associated osteophyte with a chronic appearance. There is moderate spinal stenosis with cord flattening. No cord signal abnormality.  C6-7:  Mild degenerative change  C7-T1:  Negative  IMPRESSION: Extensive edema throughout the right erector spinae muscles extending from the occiput to the upper thoracic spine and right medial trapezius muscle. No focal hematoma. Left-sided muscles are normal. No bone marrow edema. Findings are most likely due to muscle edema and possibly muscle necrosis. No evidence of hematoma or abscess.  Negative for fracture  Mild Chiari malformation  Chronic central disc protrusion and spurring at C5-6 with moderate spinal stenosis. No cord injury at this level.  These results were called by telephone at the time of interpretation on 07/02/2014 at 9:22 pm to Dr. Winfred Leeds, who verbally acknowledged these results.   Electronically Signed   By: Franchot Gallo M.D.   On: 07/02/2014 21:24     EKG: Independently reviewed.    Assessment/Plan:   44 y.o. female with  Principal Problem:   1.    Acute kidney failure   IVFs   Monitor BUN/Cr   Hold ARB  Rx   Hold NSAIDs   Renal Consulted   Transfer to [facility]   Active Problems:  2.   Rhabdomyolysis   Alkalinize Urine     Bicarb drip ordered   Monitor CPK level   Hold Statin Rx   3.    Syncope and collapse vs Concussion   MRI of Brain- Negative for Acute Findings   Monitor Orthostatics       4.   Skin eruption   Contact Precautions   Herpetic Eruption- Check for Varicella Zoster PCR, and HIV     5.   Neck pain-   MRI  C-Spine , + Contusion of Neck S/P Fall , and      Chronic Herniated Disk with Spurring       at C-5- C-6 on Ct scan      Pain control   May Need Neurosurgery Referral    6.   Essential hypertension, benign   Monitor BPs   Continue Amlodipine, and Metoprolol Rx    7.   Thyroid disease   Continue Levothyroxine   Check TSH Level    8.  Depression   Continue Nortriptyline, and Abilify Rx    9.   +UDS- +Cocaine, and +Opiates, Patient Denies   Repeat UDS ordered      9.   DVT Prophylaxis   SCDs    Code Status:     FULL CODE     Family Communication:   No Family Present    Disposition Plan:    Inpatient  Status        Time spent:  [facility] Pager [phone]   If [place] Please Contact the Day Rounding Team MD for [facility]  If 7PM-7AM, Please Contact Night-Floor Coverage  [email or web address] Password [email or web address] 07/02/2014, 11:41 PM     ADDENDUM:   Patient was seen and examined on 07/02/2014

## 2014-07-02 NOTE — ED Provider Notes (Signed)
Patient reports she had syncopal event and was unconscious on the floor for nights ago for. Approximate 12 hours. Since the event she complains of neck pain pain in her buttocks and unable to move her left hand. No fever she denies substance abuse. She's also developed a rash since the fall for nights ago. Denies substance abuse. denies chest pain. Complains of diffuse myalgias. On exam alert Glasgow Coma Score 15 HEENT exam mucous membranes dry. No mucosal lesion. Neck full flexion. No point tenderness She has pain on rotating her head to the right. Lungs clear auscultation heart regular rate and rhythm no murmurs abdomen obese nontender extremities without edema. Neurologic Glasgow Coma Score 15. Cranial nerves II through XII grossly intact. She has a left-sided radial nerve palsy of the hand. Radial pulse 2+ all digits with good capillary refill. She able a to make a fist without difficulty with left hand and all other extremities full range of motion, neurovascular intact. Gait normal Romberg normal pronator drift normal  Orlie Dakin, MD 07/02/14 1812

## 2014-07-03 ENCOUNTER — Inpatient Hospital Stay (HOSPITAL_COMMUNITY): Payer: Medicaid Other

## 2014-07-03 DIAGNOSIS — M6282 Rhabdomyolysis: Secondary | ICD-10-CM

## 2014-07-03 DIAGNOSIS — N179 Acute kidney failure, unspecified: Principal | ICD-10-CM

## 2014-07-03 DIAGNOSIS — F1410 Cocaine abuse, uncomplicated: Secondary | ICD-10-CM

## 2014-07-03 LAB — BASIC METABOLIC PANEL
ANION GAP: 10 (ref 5–15)
BUN: 38 mg/dL — ABNORMAL HIGH (ref 6–23)
CO2: 28 mmol/L (ref 19–32)
Calcium: 7.9 mg/dL — ABNORMAL LOW (ref 8.4–10.5)
Chloride: 96 mmol/L (ref 96–112)
Creatinine, Ser: 8.87 mg/dL — ABNORMAL HIGH (ref 0.50–1.10)
GFR calc Af Amer: 6 mL/min — ABNORMAL LOW (ref >90)
GFR calc non Af Amer: 5 mL/min — ABNORMAL LOW (ref >90)
Glucose, Bld: 133 mg/dL — ABNORMAL HIGH (ref 70–99)
Potassium: 2.6 mmol/L — CL (ref 3.5–5.1)
SODIUM: 134 mmol/L — AB (ref 135–145)

## 2014-07-03 LAB — CBC
HEMATOCRIT: 29.7 % — AB (ref 36.0–46.0)
Hemoglobin: 10.2 g/dL — ABNORMAL LOW (ref 12.0–15.0)
MCH: 30.8 pg (ref 26.0–34.0)
MCHC: 34.3 g/dL (ref 30.0–36.0)
MCV: 89.7 fL (ref 78.0–100.0)
Platelets: 291 10*3/uL (ref 150–400)
RBC: 3.31 MIL/uL — AB (ref 3.87–5.11)
RDW: 13.5 % (ref 11.5–15.5)
WBC: 4.6 10*3/uL (ref 4.0–10.5)

## 2014-07-03 LAB — CK: CK TOTAL: 19519 U/L — AB (ref 7–177)

## 2014-07-03 LAB — MAGNESIUM: Magnesium: 2.1 mg/dL (ref 1.5–2.5)

## 2014-07-03 LAB — TSH: TSH: 53.954 u[IU]/mL — AB (ref 0.350–4.500)

## 2014-07-03 LAB — HIV ANTIBODY (ROUTINE TESTING W REFLEX): HIV Screen 4th Generation wRfx: NONREACTIVE

## 2014-07-03 LAB — GC/CHLAMYDIA PROBE AMP (~~LOC~~) NOT AT ARMC
CHLAMYDIA, DNA PROBE: NEGATIVE
Neisseria Gonorrhea: NEGATIVE

## 2014-07-03 LAB — RPR: RPR Ser Ql: NONREACTIVE

## 2014-07-03 MED ORDER — BUPROPION HCL ER (SR) 150 MG PO TB12
150.0000 mg | ORAL_TABLET | Freq: Two times a day (BID) | ORAL | Status: DC
  Administered 2014-07-03 – 2014-07-13 (×21): 150 mg via ORAL
  Filled 2014-07-03 (×23): qty 1

## 2014-07-03 MED ORDER — ONDANSETRON HCL 4 MG/2ML IJ SOLN: 4.0000 mg | Freq: Four times a day (QID) | INTRAMUSCULAR | Status: DC | PRN

## 2014-07-03 MED ORDER — METOPROLOL SUCCINATE ER 100 MG PO TB24
100.0000 mg | ORAL_TABLET | Freq: Every day | ORAL | Status: DC
  Administered 2014-07-03 – 2014-07-04 (×2): 100 mg via ORAL
  Filled 2014-07-03 (×2): qty 1

## 2014-07-03 MED ORDER — HYDROMORPHONE HCL 1 MG/ML IJ SOLN
0.5000 mg | INTRAMUSCULAR | Status: DC | PRN
Start: 2014-07-03 — End: 2014-07-07
  Administered 2014-07-03 – 2014-07-04 (×9): 1 mg via INTRAVENOUS
  Administered 2014-07-05: 0.5 mg via INTRAVENOUS
  Administered 2014-07-05 – 2014-07-07 (×7): 1 mg via INTRAVENOUS
  Filled 2014-07-03 (×17): qty 1

## 2014-07-03 MED ORDER — ACETAMINOPHEN 325 MG PO TABS
650.0000 mg | ORAL_TABLET | Freq: Four times a day (QID) | ORAL | Status: DC | PRN
  Administered 2014-07-03: 650 mg via ORAL
  Filled 2014-07-03 (×2): qty 2

## 2014-07-03 MED ORDER — POTASSIUM CHLORIDE CRYS ER 20 MEQ PO TBCR
40.0000 meq | EXTENDED_RELEASE_TABLET | Freq: Once | ORAL | Status: AC
  Administered 2014-07-03: 40 meq via ORAL
  Filled 2014-07-03: qty 2

## 2014-07-03 MED ORDER — SODIUM BICARBONATE 8.4 % IV SOLN
150.0000 meq | INTRAVENOUS | Status: DC
  Administered 2014-07-03 – 2014-07-05 (×4): 150 meq via INTRAVENOUS
  Filled 2014-07-03 (×11): qty 850

## 2014-07-03 MED ORDER — ARIPIPRAZOLE 2 MG PO TABS
2.0000 mg | ORAL_TABLET | Freq: Every day | ORAL | Status: DC
  Administered 2014-07-03 – 2014-07-13 (×11): 2 mg via ORAL
  Filled 2014-07-03 (×12): qty 1

## 2014-07-03 MED ORDER — FERROUS SULFATE 325 (65 FE) MG PO TABS
325.0000 mg | ORAL_TABLET | Freq: Every day | ORAL | Status: DC
  Administered 2014-07-03 – 2014-07-13 (×11): 325 mg via ORAL
  Filled 2014-07-03 (×22): qty 1

## 2014-07-03 MED ORDER — ONDANSETRON HCL 4 MG PO TABS: 4.0000 mg | ORAL_TABLET | Freq: Four times a day (QID) | ORAL | Status: DC | PRN

## 2014-07-03 MED ORDER — NORTRIPTYLINE HCL 25 MG PO CAPS
25.0000 mg | ORAL_CAPSULE | Freq: Every day | ORAL | Status: DC
  Administered 2014-07-03 – 2014-07-12 (×10): 25 mg via ORAL
  Filled 2014-07-03 (×15): qty 1

## 2014-07-03 MED ORDER — POTASSIUM CHLORIDE CRYS ER 20 MEQ PO TBCR
40.0000 meq | EXTENDED_RELEASE_TABLET | Freq: Two times a day (BID) | ORAL | Status: AC
  Administered 2014-07-03 (×2): 40 meq via ORAL
  Filled 2014-07-03 (×4): qty 2

## 2014-07-03 MED ORDER — ACETAMINOPHEN 650 MG RE SUPP: 650.0000 mg | Freq: Four times a day (QID) | RECTAL | Status: DC | PRN

## 2014-07-03 MED ORDER — LEVOTHYROXINE SODIUM 25 MCG PO TABS
137.0000 ug | ORAL_TABLET | Freq: Every day | ORAL | Status: DC
  Administered 2014-07-03 – 2014-07-09 (×7): 137 ug via ORAL
  Filled 2014-07-03 (×14): qty 1

## 2014-07-03 MED ORDER — GABAPENTIN 300 MG PO CAPS
300.0000 mg | ORAL_CAPSULE | Freq: Two times a day (BID) | ORAL | Status: DC
  Administered 2014-07-03 – 2014-07-13 (×22): 300 mg via ORAL
  Filled 2014-07-03 (×23): qty 1

## 2014-07-03 MED ORDER — AMLODIPINE BESYLATE 10 MG PO TABS
10.0000 mg | ORAL_TABLET | Freq: Every day | ORAL | Status: DC
  Administered 2014-07-03 – 2014-07-04 (×2): 10 mg via ORAL
  Filled 2014-07-03 (×2): qty 1

## 2014-07-03 MED ORDER — ZOLPIDEM TARTRATE 5 MG PO TABS
5.0000 mg | ORAL_TABLET | Freq: Every evening | ORAL | Status: DC | PRN
  Administered 2014-07-03 – 2014-07-12 (×10): 5 mg via ORAL
  Filled 2014-07-03 (×10): qty 1

## 2014-07-03 NOTE — Progress Notes (Signed)
Utilization review completed. Ionia Schey, RN, BSN. 

## 2014-07-03 NOTE — Progress Notes (Signed)
PROGRESS NOTE  Jill Hopkins QAS:[id number] DOB: Aug 04, 1970 DOA: 07/02/2014 PCP: Benito Mccreedy, MD  HPI/Recap of past 24 hours: C/o generalized pain, left forearm swollen, left hand weakness  Assessment/Plan: Principal Problem:   Acute kidney failure Active Problems:   Essential hypertension, benign   Rhabdomyolysis   Syncope and collapse   Skin eruption   Thyroid disease   Depression   Neck pain  1. Acute kidney failure IVFs Monitor BUN/Cr Hold ARB Rx Hold NSAIDs Renal Consulted strict intake and output, renal US pending   Active Problems: 2. Rhabdomyolysis Alkalinize Urine  Bicarb drip ordered Monitor CPK level Hold Statin Rx   3. Syncope and collapse vs Concussion MRI of Brain- Negative for Acute Findings Monitor Orthostatics    4. Skin eruption Contact Precautions Herpetic Eruption- Check for Varicella Zoster PCR, and HIV                         Levamisole induced vasculitis? (urine positive for cocaine) consider steroids if worsening    5. Neck pain- MRI C-Spine , + Contusion of Neck S/P Fall , and  Chronic Herniated Disk with Spurring  at C-5- C-6 on Ct scan  Pain control Neurosurgery Referral at discharge   6. Essential hypertension, benign Monitor BPs Continue Amlodipine, and Metoprolol Rx, hold hyzaar due to ARF   7. Thyroid  disease Continue Levothyroxine TSH pending   8. Depression Continue Nortriptyline, and Abilify Rx    9. +UDS- +Cocaine, and +Opiates, Patient Denies Repeat UDS +opiates/cocaine    9. DVT Prophylaxis SCDs  10.  Bacterial vaginosis: reported vaginal discharge in the ED, start flagyl   Code Status: FULL CODE  Family Communication: No Family Present  Disposition Plan: Inpatient Status     Consultants:  nephrology  Procedures:  none  Antibiotics:  none   Objective: BP 144/77 mmHg  Pulse 62  Temp(Src) 98.5 F (36.9 C) (Oral)  Resp 18  Ht 5\' 4"  (1.626 m)  Wt 83.008 kg (183 lb)  BMI 31.40 kg/m2  SpO2 100%  LMP 06/03/2014 (Approximate)  Intake/Output Summary (Last 24 hours) at 07/03/14 1136 Last data filed at 07/03/14 0300  Gross per 24 hour  Intake   1360 ml  Output      0 ml  Net   1360 ml   Filed Weights   07/03/14 0121  Weight: 83.008 kg (183 lb)    Exam:   General:  AAox3, NAD  Cardiovascular: RRR  Respiratory: CTABL  Abdomen: soft/NT/ND, positive bowel sounds  Musculoskeletal: generalized tenderness, right cervical muscle edema/tenderness, left arm edema, sensation/pulse intact, does has weakness left hand.  Skin: vesicular skin eruptions right lateral neck, left face, from abrasion?  Data Reviewed: Basic Metabolic Panel:  Recent Labs Lab 07/02/14 1758 07/03/14 0752  NA 130* 134*  K 3.3* 2.6*  CL 100 96  CO2 18* 28  GLUCOSE 110* 133*  BUN 42* 38*  CREATININE 8.84* 8.87*  CALCIUM 8.2* 7.9*  MG  --  2.1   Liver Function Tests: No results for input(s): AST, ALT, ALKPHOS, BILITOT, PROT, ALBUMIN in the last 168 hours. No results for input(s): LIPASE, AMYLASE in the last 168 hours. No results for input(s): AMMONIA in the last 168 hours. CBC:  Recent  Labs Lab 07/02/14 1758 07/03/14 0752  WBC 5.8 4.6  NEUTROABS 4.3  --   HGB 11.4* 10.2*  HCT 34.0* 29.7*  MCV 92.1 89.7  PLT 367 291   Cardiac Enzymes:  Recent Labs Lab 07/02/14 1758 07/03/14 0018  CKTOTAL 78295* 19519*   BNP (last 3 results) No results for input(s): BNP in the last 8760 hours.  ProBNP (last 3 results) No results for input(s): PROBNP in the last 8760 hours.  CBG: No results for input(s): GLUCAP in the last 168 hours.  Recent Results (from the past 240 hour(s))  Wet prep, genital     Status: Abnormal   Collection Time: 07/02/14  7:00 PM  Result Value Ref Range Status   Yeast Wet Prep HPF POC NONE SEEN NONE SEEN Final   Trich, Wet Prep NONE SEEN NONE SEEN Final   Clue Cells Wet Prep HPF POC FEW (A) NONE SEEN Final   WBC, Wet Prep HPF POC FEW (A) NONE SEEN Final     Studies: Mr Brain Wo Contrast  07/02/2014   CLINICAL DATA:  [facility] [address] 4 days ago and unconscious for 12 hours. Now with neck pain. Left radial nerve palsy  EXAM: MRI HEAD WITHOUT CONTRAST  TECHNIQUE: Multiplanar, multiecho pulse sequences of the brain and surrounding structures were obtained without intravenous contrast.  COMPARISON:  CT head 06/03/2011  FINDINGS: Cerebellar tonsils extend 8 mm below the foramen magnum compatible with mild Chiari malformation. Negative for hydrocephalus. Pituitary normal in size.  Negative for acute infarct.  Negative for chronic ischemia.  Negative for demyelinating disease. Cerebral white matter normal. Brainstem and basal ganglia normal  Negative for intracranial hemorrhage or fluid collection.  Negative for mass or edema.  No shift of the midline structures.  Mild mucosal edema paranasal sinuses  There is extensive edema in the erector spinae muscles of the right posterior neck below the occiput. This is unilateral and involves all of the muscles. This may reach related to acute muscle injury from the patient's fall or subsequent positioning while unconscious. This  may lead to rhabdomyolysis.  IMPRESSION: Mild Chiari malformation.  No acute intracranial abnormality.  Extensive edema in the erector spinae muscles in the right neck, worrisome for acute muscle injury. This may lead to rhabdomyolysis.   Electronically Signed   By: Franchot Gallo M.D.   On: 07/02/2014 20:58   Mr Cervical Spine Wo Contrast  07/02/2014   CLINICAL DATA:  Fall 4 days ago. Unconscious 12 hours. Neck pain. Left radial nerve palsy.  EXAM: MRI CERVICAL SPINE WITHOUT CONTRAST  TECHNIQUE: Multiplanar, multisequence MR imaging of the cervical spine was performed. No intravenous contrast was administered.  COMPARISON:  MRI of the brain today  FINDINGS: Image quality is diminished due to difficulty positioning the patient in the coil.  Extensive edema in the erector spinae muscles on the right below the occiput and extending into the upper thoracic spine. The lower extent of this abnormality is not imaged. There is some edema in the medial trapezius muscle on the right. No well-defined fluid collection or hematoma is identified. The muscle bundles are enlarged.  Negative for fracture. No bone marrow edema identified. No mass lesion.  Mild Chiari malformation.  Cervical spine normal alignment  C2-3:  Negative  C3-4: Mild uncinate spurring on the left and mild left facet hypertrophy causing left foraminal narrowing. No cord deformity  C4-5: Mild disc and facet degeneration and spurring. Mild left foraminal narrowing  C5-6: Moderately large central disc protrusion and associated osteophyte with a chronic appearance. There is moderate spinal stenosis with cord flattening. No cord signal abnormality.  C6-7:  Mild degenerative change  C7-T1:  Negative  IMPRESSION: Extensive edema throughout the right erector spinae muscles  extending from the occiput to the upper thoracic spine and right medial trapezius muscle. No focal hematoma. Left-sided muscles are normal. No bone marrow edema. Findings are most likely due to  muscle edema and possibly muscle necrosis. No evidence of hematoma or abscess.  Negative for fracture  Mild Chiari malformation  Chronic central disc protrusion and spurring at C5-6 with moderate spinal stenosis. No cord injury at this level.  These results were called by telephone at the time of interpretation on 07/02/2014 at 9:22 pm to Dr. Winfred Leeds, who verbally acknowledged these results.   Electronically Signed   By: Franchot Gallo M.D.   On: 07/02/2014 21:24    Scheduled Meds: . amLODipine  10 mg Oral Daily  . ARIPiprazole  2 mg Oral Daily  . buPROPion  150 mg Oral BID  . ferrous sulfate  325 mg Oral Daily  . gabapentin  300 mg Oral BID  . levothyroxine  137 mcg Oral QAC breakfast  . metoprolol succinate  100 mg Oral Daily  . nortriptyline  25 mg Oral QHS  . pantoprazole (PROTONIX) IV  40 mg Intravenous Q24H  . potassium chloride  40 mEq Oral BID    Continuous Infusions: . dextrose 5 % 850 mL with sodium bicarbonate 150 mEq infusion 200 mL/hr at 07/03/14 [place] [facility] Pager [phone]. If 7PM-7AM, please contact night-coverage at [email or web address], password [facility] 07/03/2014, 11:36 AM  LOS: 1 day

## 2014-07-03 NOTE — Progress Notes (Signed)
Explained to Jill Hopkins new order for a Foley catheter to collect an accurate output to keep a close check on her urine output and kidney function.

## 2014-07-03 NOTE — Consult Note (Signed)
Reason for Consult:ARF, Rhabdomyolysis Referring Physician: Erlinda Hong, MD  Jill Hopkins is an 44 y.o. female.  HPI: Pt is a 44yo F with PMH sig for HTN and depression who fell in her bathroom and was unconscious for 12 hours.  She woke up on the bathroom floor and didn't know what happened by was having neck and left arm pain.  She went to [facility] ED for evaluation and was found to have ARF with Scr of 8.84 as well as rhabdomyolysis with CPK levels of >20,000.  She was started on IVF"s and then isotonic bicarb and transferred to [facility] for possible need of hemodialysis.  We were consulted to help evaluate and manage her ARF.  Of note, she had been taking NSAIDS as well as losartan-HCT prior to admission and urine toxicology screen was positive for cocaine.  [name], however denies any illicit drug use despite having UA positive for opiates and cocaine.  Trend in Creatinine: CREATININE, SER  Date/Time Value Ref Range Status  07/03/2014 07:52 AM 8.87* 0.50 - 1.10 mg/dL Final  07/02/2014 05:58 PM 8.84* 0.50 - 1.10 mg/dL Final  03/04/2013 08:29 PM 1.08 0.50 - 1.10 mg/dL Final  08/19/2012 02:42 PM 1.21* 0.50 - 1.10 mg/dL Final  06/03/2011 02:32 AM 0.90 0.50 - 1.10 mg/dL Final  01/01/2010 07:28 AM 0.66 0.4 - 1.2 mg/dL Final    PMH:   Past Medical History  Diagnosis Date  . Hypertension   . Thyroid disease   . Depression   . Renal disorder     PSH:   Past Surgical History  Procedure Laterality Date  . Cervical cerclage      Allergies:  Allergies  Allergen Reactions  . Ibuprofen Nausea And Vomiting    Medications:   Prior to Admission medications   Medication Sig Start Date End Date Taking? Authorizing Provider  ABILIFY 2 MG tablet Take 2 mg by mouth daily. 04/27/14  Yes Historical Provider, MD  amLODipine (NORVASC) 10 MG tablet Take 10 mg by mouth daily. 04/20/14  Yes Historical Provider, MD  buPROPion (WELLBUTRIN SR) 150 MG 12 hr tablet Take 150 mg by mouth 2 (two) times daily.   Yes  Historical Provider, MD  ferrous sulfate 325 (65 FE) MG EC tablet Take 325 mg by mouth daily.    Yes Historical Provider, MD  gabapentin (NEURONTIN) 300 MG capsule Take 300 mg by mouth 2 (two) times daily.   Yes Historical Provider, MD  ketoprofen (ORUDIS) 75 MG capsule Take 75 mg by mouth 3 (three) times daily.   Yes Historical Provider, MD  levothyroxine (SYNTHROID, LEVOTHROID) 137 MCG tablet Take 137 mcg by mouth daily.   Yes Historical Provider, MD  losartan-hydrochlorothiazide (HYZAAR) 100-25 MG per tablet Take 1 tablet by mouth daily.   Yes Historical Provider, MD  metoprolol succinate (TOPROL-XL) 100 MG 24 hr tablet Take 100 mg by mouth daily. Take with or immediately following a meal.   Yes Historical Provider, MD  nortriptyline (PAMELOR) 25 MG capsule Take 25 mg by mouth at bedtime.   Yes Historical Provider, MD  NUVARING 0.12-0.015 MG/24HR vaginal ring USE AS DIRECTED 05/07/13  Yes Shelly Bombard, MD  rosuvastatin (CRESTOR) 20 MG tablet Take 20 mg by mouth daily.   Yes Historical Provider, MD  zolpidem (AMBIEN CR) 12.5 MG CR tablet Take 12.5 mg by mouth at bedtime. 04/20/14  Yes Historical Provider, MD  clotrimazole (LOTRIMIN) 1 % cream Apply 1 application topically 2 (two) times daily. 03/18/13   Shelly Bombard,  MD    Inpatient medications: . amLODipine  10 mg Oral Daily  . ARIPiprazole  2 mg Oral Daily  . buPROPion  150 mg Oral BID  . ferrous sulfate  325 mg Oral Daily  . gabapentin  300 mg Oral BID  . levothyroxine  137 mcg Oral QAC breakfast  . metoprolol succinate  100 mg Oral Daily  . nortriptyline  25 mg Oral QHS  . pantoprazole (PROTONIX) IV  40 mg Intravenous Q24H  . potassium chloride  40 mEq Oral BID    Discontinued Meds:   Medications Discontinued During This Encounter  Medication Reason  . amLODipine (NORVASC) 5 MG tablet Dose change  . levothyroxine (SYNTHROID, LEVOTHROID) 125 MCG tablet Dose change  . dextrose 5 % 850 mL with sodium bicarbonate 150 mEq  infusion   . dextrose 5 % 1,000 mL with sodium bicarbonate 150 mEq infusion Duplicate    Social History:  reports that she quit smoking about 15 months ago. Her smoking use included Cigarettes. She has never used smokeless tobacco. She reports that she does not drink alcohol or use illicit drugs.  Family History:  History reviewed. No pertinent family history.  A comprehensive review of systems was negative except for: Musculoskeletal: positive for myalgias, neck pain and left arm pain Neurological: positive for paresthesia Weight change:   Intake/Output Summary (Last 24 hours) at 07/03/14 1306 Last data filed at 07/03/14 1146  Gross per 24 hour  Intake   1600 ml  Output    525 ml  Net   1075 ml   BP 144/77 mmHg  Pulse 62  Temp(Src) 98.5 F (36.9 C) (Oral)  Resp 18  Ht 5\' 4"  (1.626 m)  Wt 83.008 kg (183 lb)  BMI 31.40 kg/m2  SpO2 100%  LMP 06/03/2014 (Approximate) Filed Vitals:   07/03/14 0121 07/03/14 0530 07/03/14 0759 07/03/14 1133  BP:  140/83 144/77   Pulse:  57 58 62  Temp:  98.5 F (36.9 C)    TempSrc:  Oral    Resp:  17 18   Height: 5\' 4"  (1.626 m)     Weight: 83.008 kg (183 lb)     SpO2:  100%       General appearance: fatigued, moderately obese and slowed mentation Head: Normocephalic, without obvious abnormality, lacerations on left side of face/forehead Neck: no adenopathy, no carotid bruit, no JVD, supple, symmetrical, trachea midline, thyroid not enlarged, symmetric, no tenderness/mass/nodules and tender along right side Resp: clear to auscultation bilaterally Cardio: regular rate and rhythm, S1, S2 normal, no murmur, click, rub or gallop GI: soft, non-tender; bowel sounds normal; no masses,  no organomegaly Extremities: no pitting edema, some tenderness along left arm  Labs: Basic Metabolic Panel:  Recent Labs Lab 07/02/14 1758 07/03/14 0752  NA 130* 134*  K 3.3* 2.6*  CL 100 96  CO2 18* 28  GLUCOSE 110* 133*  BUN 42* 38*  CREATININE 8.84*  8.87*  CALCIUM 8.2* 7.9*   Liver Function Tests: No results for input(s): AST, ALT, ALKPHOS, BILITOT, PROT, ALBUMIN in the last 168 hours. No results for input(s): LIPASE, AMYLASE in the last 168 hours. No results for input(s): AMMONIA in the last 168 hours. CBC:  Recent Labs Lab 07/02/14 1758 07/03/14 0752  WBC 5.8 4.6  NEUTROABS 4.3  --   HGB 11.4* 10.2*  HCT 34.0* 29.7*  MCV 92.1 89.7  PLT 367 291   PT/INR: @LABRCNTIP (inr:5) Cardiac Enzymes: ) Recent Labs Lab 07/02/14 1758 07/03/14 0018  CKTOTAL 16109* 19519*   CBG: No results for input(s): GLUCAP in the last 168 hours.  Iron Studies: No results for input(s): IRON, TIBC, TRANSFERRIN, FERRITIN in the last 168 hours.  Xrays/Other Studies: Mr Brain Wo Contrast  07/02/2014   CLINICAL DATA:  [place] 4 days ago and unconscious for 12 hours. Now with neck pain. Left radial nerve palsy  EXAM: MRI HEAD WITHOUT CONTRAST  TECHNIQUE: Multiplanar, multiecho pulse sequences of the brain and surrounding structures were obtained without intravenous contrast.  COMPARISON:  CT head 06/03/2011  FINDINGS: Cerebellar tonsils extend 8 mm below the foramen magnum compatible with mild Chiari malformation. Negative for hydrocephalus. Pituitary normal in size.  Negative for acute infarct.  Negative for chronic ischemia.  Negative for demyelinating disease. Cerebral white matter normal. Brainstem and basal ganglia normal  Negative for intracranial hemorrhage or fluid collection.  Negative for mass or edema.  No shift of the midline structures.  Mild mucosal edema paranasal sinuses  There is extensive edema in the erector spinae muscles of the right posterior neck below the occiput. This is unilateral and involves all of the muscles. This may reach related to acute muscle injury from the patient's fall or subsequent positioning while unconscious. This may lead to rhabdomyolysis.  IMPRESSION: Mild Chiari malformation.  No acute intracranial abnormality.   Extensive edema in the erector spinae muscles in the right neck, worrisome for acute muscle injury. This may lead to rhabdomyolysis.   Electronically Signed   By: Franchot Gallo M.D.   On: 07/02/2014 20:58   Mr Cervical Spine Wo Contrast  07/02/2014   CLINICAL DATA:  Fall 4 days ago. Unconscious 12 hours. Neck pain. Left radial nerve palsy.  EXAM: MRI CERVICAL SPINE WITHOUT CONTRAST  TECHNIQUE: Multiplanar, multisequence MR imaging of the cervical spine was performed. No intravenous contrast was administered.  COMPARISON:  MRI of the brain today  FINDINGS: Image quality is diminished due to difficulty positioning the patient in the coil.  Extensive edema in the erector spinae muscles on the right below the occiput and extending into the upper thoracic spine. The lower extent of this abnormality is not imaged. There is some edema in the medial trapezius muscle on the right. No well-defined fluid collection or hematoma is identified. The muscle bundles are enlarged.  Negative for fracture. No bone marrow edema identified. No mass lesion.  Mild Chiari malformation.  Cervical spine normal alignment  C2-3:  Negative  C3-4: Mild uncinate spurring on the left and mild left facet hypertrophy causing left foraminal narrowing. No cord deformity  C4-5: Mild disc and facet degeneration and spurring. Mild left foraminal narrowing  C5-6: Moderately large central disc protrusion and associated osteophyte with a chronic appearance. There is moderate spinal stenosis with cord flattening. No cord signal abnormality.  C6-7:  Mild degenerative change  C7-T1:  Negative  IMPRESSION: Extensive edema throughout the right erector spinae muscles extending from the occiput to the upper thoracic spine and right medial trapezius muscle. No focal hematoma. Left-sided muscles are normal. No bone marrow edema. Findings are most likely due to muscle edema and possibly muscle necrosis. No evidence of hematoma or abscess.  Negative for fracture   Mild Chiari malformation  Chronic central disc protrusion and spurring at C5-6 with moderate spinal stenosis. No cord injury at this level.  These results were called by telephone at the time of interpretation on 07/02/2014 at 9:22 pm to Dr. Winfred Leeds, who verbally acknowledged these results.   Electronically Signed   By:  Franchot Gallo M.D.   On: 07/02/2014 21:24     Assessment/Plan: 1.  ARF- in setting of rhabdomyolysis, NSAIDs/ARB/Cocaine.  Continue with isotonic bicarb and follow UOP, Scr, and CPK levels 1. No indication for dialysis at this time.   2. Rhabdo- cont with IVF's with isotonic bicarb, CPK's starting to decline 3. Cocaine +- pt denies any cocaine ingestion.  Will order blood tests for metabolites.  Will also check for ANCA vasculitis however this seems most c/w rhabdo and not levimasole-induced vasculitis. 4. HTN- stable 5. Hypokalemia- continue to replete but will need more than bid dosing with K level of 2.6.  Would like IV runs, however no central line access present at this time. 6. Syncope- ?cocaine induced vs. Arrythmia.  W/u per primary svc 7. Hyperglycemia- change D5 with bicarb to isotonic bicarb. 8. Anemia- normocytic.  Cont to follow. 9. Cervical disk disease- per primary svc   Edu On A 07/03/2014, 1:06 PM

## 2014-07-04 DIAGNOSIS — R55 Syncope and collapse: Secondary | ICD-10-CM

## 2014-07-04 DIAGNOSIS — E079 Disorder of thyroid, unspecified: Secondary | ICD-10-CM

## 2014-07-04 LAB — COMPREHENSIVE METABOLIC PANEL
ALT: 109 U/L — ABNORMAL HIGH (ref 0–35)
ANION GAP: 9 (ref 5–15)
AST: 205 U/L — ABNORMAL HIGH (ref 0–37)
Albumin: 2.8 g/dL — ABNORMAL LOW (ref 3.5–5.2)
Alkaline Phosphatase: 39 U/L (ref 39–117)
BUN: 37 mg/dL — AB (ref 6–23)
CALCIUM: 7.9 mg/dL — AB (ref 8.4–10.5)
CHLORIDE: 90 mmol/L — AB (ref 96–112)
CO2: 35 mmol/L — AB (ref 19–32)
CREATININE: 8.44 mg/dL — AB (ref 0.50–1.10)
GFR calc Af Amer: 6 mL/min — ABNORMAL LOW (ref >90)
GFR, EST NON AFRICAN AMERICAN: 5 mL/min — AB (ref >90)
GLUCOSE: 90 mg/dL (ref 70–99)
Potassium: 3.8 mmol/L (ref 3.5–5.1)
Sodium: 134 mmol/L — ABNORMAL LOW (ref 135–145)
Total Bilirubin: 0.5 mg/dL (ref 0.3–1.2)
Total Protein: 5.9 g/dL — ABNORMAL LOW (ref 6.0–8.3)

## 2014-07-04 LAB — IRON AND TIBC
Iron: 82 ug/dL (ref 42–145)
SATURATION RATIOS: 22 % (ref 20–55)
TIBC: 367 ug/dL (ref 250–470)
UIBC: 285 ug/dL (ref 125–400)

## 2014-07-04 LAB — C4 COMPLEMENT: Complement C4, Body Fluid: 27 mg/dL (ref 14–44)

## 2014-07-04 LAB — CK: CK TOTAL: 11935 U/L — AB (ref 7–177)

## 2014-07-04 LAB — FERRITIN: Ferritin: 49 ng/mL (ref 10–291)

## 2014-07-04 LAB — CBC
HCT: 30.2 % — ABNORMAL LOW (ref 36.0–46.0)
Hemoglobin: 10.3 g/dL — ABNORMAL LOW (ref 12.0–15.0)
MCH: 30.7 pg (ref 26.0–34.0)
MCHC: 34.1 g/dL (ref 30.0–36.0)
MCV: 89.9 fL (ref 78.0–100.0)
PLATELETS: 273 10*3/uL (ref 150–400)
RBC: 3.36 MIL/uL — AB (ref 3.87–5.11)
RDW: 13.4 % (ref 11.5–15.5)
WBC: 4.9 10*3/uL (ref 4.0–10.5)

## 2014-07-04 LAB — ANTISTREPTOLYSIN O TITER: ASO: 69.9 IU/mL (ref 0.0–200.0)

## 2014-07-04 LAB — MPO/PR-3 (ANCA) ANTIBODIES: ANCA Proteinase 3: <3.5 U/mL (ref 0.0–3.5)

## 2014-07-04 LAB — COMPLEMENT, TOTAL: Compl, Total (CH50): >60 U/mL — ABNORMAL HIGH (ref 42–60)

## 2014-07-04 LAB — PHOSPHORUS: PHOSPHORUS: 4.5 mg/dL (ref 2.3–4.6)

## 2014-07-04 LAB — C3 COMPLEMENT: C3 COMPLEMENT: 123 mg/dL (ref 82–167)

## 2014-07-04 LAB — ANTI-DNA ANTIBODY, DOUBLE-STRANDED: ds DNA Ab: <1 IU/mL

## 2014-07-04 LAB — VITAMIN B12: VITAMIN B 12: 696 pg/mL (ref 211–911)

## 2014-07-04 LAB — HIV ANTIBODY (ROUTINE TESTING W REFLEX): HIV Screen 4th Generation wRfx: NONREACTIVE

## 2014-07-04 MED ORDER — METRONIDAZOLE 500 MG PO TABS
500.0000 mg | ORAL_TABLET | Freq: Two times a day (BID) | ORAL | Status: DC
Start: 2014-07-04 — End: 2014-07-13
  Administered 2014-07-05 – 2014-07-13 (×18): 500 mg via ORAL
  Filled 2014-07-04 (×22): qty 1

## 2014-07-04 MED ORDER — PANTOPRAZOLE SODIUM 40 MG PO TBEC
40.0000 mg | DELAYED_RELEASE_TABLET | Freq: Every day | ORAL | Status: DC
  Administered 2014-07-04 – 2014-07-12 (×9): 40 mg via ORAL
  Filled 2014-07-04 (×9): qty 1

## 2014-07-04 NOTE — Progress Notes (Signed)
Subjective: Interval History: has no complaint .  Objective: Vital signs in last 24 hours: Temp:  [97.4 F (36.3 C)-98.2 F (36.8 C)] 98.2 F (36.8 C) (03/18 0533) Pulse Rate:  [54-64] 64 (03/18 0533) Resp:  [17-18] 18 (03/18 0533) BP: (126-144)/(72-97) 137/89 mmHg (03/18 0533) SpO2:  [96 %-100 %] 99 % (03/18 0533) Weight change:   Intake/Output from previous day: 03/17 0701 - 03/18 0700 In: 1080 [P.O.:1080] Out: 2575 [Urine:2575] Intake/Output this shift:    General appearance: alert and moderately obese Resp: clear to auscultation bilaterally Cardio: S1, S2 normal GI: obese, pos bs, liver down 2 cm Extremities: extremities normal, atraumatic, no cyanosis or edema  Lab Results:  Recent Labs  07/03/14 0752 07/04/14 0530  WBC 4.6 4.9  HGB 10.2* 10.3*  HCT 29.7* 30.2*  PLT 291 273   BMET:  Recent Labs  07/03/14 0752 07/04/14 0530  NA 134* 134*  K 2.6* 3.8  CL 96 90*  CO2 28 35*  GLUCOSE 133* 90  BUN 38* 37*  CREATININE 8.87* 8.44*  CALCIUM 7.9* 7.9*   No results for input(s): PTH in the last 72 hours. Iron Studies: No results for input(s): IRON, TIBC, TRANSFERRIN, FERRITIN in the last 72 hours.  Studies/Results: Mr Herby Abraham Contrast  07/02/2014   CLINICAL DATA:  [place] 4 days ago and unconscious for 12 hours. Now with neck pain. Left radial nerve palsy  EXAM: MRI HEAD WITHOUT CONTRAST  TECHNIQUE: Multiplanar, multiecho pulse sequences of the brain and surrounding structures were obtained without intravenous contrast.  COMPARISON:  CT head 06/03/2011  FINDINGS: Cerebellar tonsils extend 8 mm below the foramen magnum compatible with mild Chiari malformation. Negative for hydrocephalus. Pituitary normal in size.  Negative for acute infarct.  Negative for chronic ischemia.  Negative for demyelinating disease. Cerebral white matter normal. Brainstem and basal ganglia normal  Negative for intracranial hemorrhage or fluid collection.  Negative for mass or edema.  No  shift of the midline structures.  Mild mucosal edema paranasal sinuses  There is extensive edema in the erector spinae muscles of the right posterior neck below the occiput. This is unilateral and involves all of the muscles. This may reach related to acute muscle injury from the patient's fall or subsequent positioning while unconscious. This may lead to rhabdomyolysis.  IMPRESSION: Mild Chiari malformation.  No acute intracranial abnormality.  Extensive edema in the erector spinae muscles in the right neck, worrisome for acute muscle injury. This may lead to rhabdomyolysis.   Electronically Signed   By: Franchot Gallo M.D.   On: 07/02/2014 20:58   Mr Cervical Spine Wo Contrast  07/02/2014   CLINICAL DATA:  Fall 4 days ago. Unconscious 12 hours. Neck pain. Left radial nerve palsy.  EXAM: MRI CERVICAL SPINE WITHOUT CONTRAST  TECHNIQUE: Multiplanar, multisequence MR imaging of the cervical spine was performed. No intravenous contrast was administered.  COMPARISON:  MRI of the brain today  FINDINGS: Image quality is diminished due to difficulty positioning the patient in the coil.  Extensive edema in the erector spinae muscles on the right below the occiput and extending into the upper thoracic spine. The lower extent of this abnormality is not imaged. There is some edema in the medial trapezius muscle on the right. No well-defined fluid collection or hematoma is identified. The muscle bundles are enlarged.  Negative for fracture. No bone marrow edema identified. No mass lesion.  Mild Chiari malformation.  Cervical spine normal alignment  C2-3:  Negative  C3-4: Mild uncinate  spurring on the left and mild left facet hypertrophy causing left foraminal narrowing. No cord deformity  C4-5: Mild disc and facet degeneration and spurring. Mild left foraminal narrowing  C5-6: Moderately large central disc protrusion and associated osteophyte with a chronic appearance. There is moderate spinal stenosis with cord flattening.  No cord signal abnormality.  C6-7:  Mild degenerative change  C7-T1:  Negative  IMPRESSION: Extensive edema throughout the right erector spinae muscles extending from the occiput to the upper thoracic spine and right medial trapezius muscle. No focal hematoma. Left-sided muscles are normal. No bone marrow edema. Findings are most likely due to muscle edema and possibly muscle necrosis. No evidence of hematoma or abscess.  Negative for fracture  Mild Chiari malformation  Chronic central disc protrusion and spurring at C5-6 with moderate spinal stenosis. No cord injury at this level.  These results were called by telephone at the time of interpretation on 07/02/2014 at 9:22 pm to Dr. Winfred Leeds, who verbally acknowledged these results.   Electronically Signed   By: Franchot Gallo M.D.   On: 07/02/2014 21:24   US Renal  07/03/2014   CLINICAL DATA:  Acute renal failure  EXAM: RENAL/URINARY TRACT ULTRASOUND COMPLETE  COMPARISON:  None.  FINDINGS: Right Kidney:  Length: 12.7 cm. Echogenicity within normal limits. No mass or hydronephrosis visualized.  Left Kidney:  Length: 13.5 cm. Echogenicity within normal limits. No mass or hydronephrosis visualized.  Bladder:  The bladder is decompressed around a Foley catheter and cannot be evaluated.  IMPRESSION: Negative right nephrosis.  No significant abnormality.   Electronically Signed   By: Andreas Newport M.D.   On: 07/03/2014 21:07    I have reviewed the patient's current medications.  Assessment/Plan: 1 AKI nonoliguric . Vol ok. Mild alkalemia but cont ivf. Vol ok. Diuresing 2 Obesity 3 substance use 4 Hypothyroid 5 Nonadherence P iv isotonic bicarb, follow Cr    LOS: 2 days   Jill Hopkins L 07/04/2014,9:46 AM

## 2014-07-04 NOTE — Clinical Documentation Improvement (Signed)
Please specify diagnosis related to below supporting information, if appropriate.   Possible Clinical Conditions?   Hyponatremia                               Other Condition___________________                 Cannot Clinically Determine_________   Supporting Information:  Patient's labs during this admission  Component     Latest Ref Rng 07/02/2014 07/03/2014 07/04/2014  Sodium     135 - 145 mmol/L 130 (L) 134 (L) 134 (L)    Treatment = IVF (dextrose 5 % 850 mL with sodium bicarbonate 150 mEq)      Thank You, Serena Colonel ,RN Clinical Documentation Specialist:  [facility] [employer]

## 2014-07-04 NOTE — Progress Notes (Addendum)
PROGRESS NOTE  Jill Hopkins QAS:[id number] DOB: Feb 27, 1971 DOA: 07/02/2014 PCP: Jill Mccreedy, MD  HPI/Recap of past 24 hours: C/o generalized pain, right sided neck pain, left forearm swollen, left hand weakness  Assessment/Plan: Principal Problem:   Acute kidney failure Active Problems:   Essential hypertension, benign   Rhabdomyolysis   Syncope and collapse   Skin eruption   Thyroid disease   Depression   Neck pain  1. Acute kidney failure IVFs Monitor BUN/Cr Hold ARB Rx Hold NSAIDs Renal Consulted strict intake and output, renal US no acute findings   Active Problems: 2. Rhabdomyolysis Alkalinize Urine  Bicarb drip ordered CPK level start to trend down, but still significantly elevated Hold Statin Rx   3. Syncope and collapse vs Concussion MRI of Brain- Negative for Acute Findings tele unrevealing, echocardiogram pending    4. Skin eruption vs abrasion Contact Precautions Herpetic Eruption- Check for Varicella Zoster PCR pending, HIV negative.                         Levamisole induced vasculitis? (urine positive for cocaine) consider steroids if worsening    5. Neck pain- MRI C-Spine , + Contusion of Neck S/P Fall , and  Chronic Herniated Disk with Spurring  at C-5- C-6 on Ct scan  Pain control Neurosurgery Referral at discharge   6. Essential hypertension, benign Monitor  BPs Continue Amlodipine, and Metoprolol Rx, hold hyzaar due to ARF   7. Thyroid disease Continue Levothyroxine TSH elevated   8. Depression Continue Nortriptyline, and Abilify Rx    9. +UDS- +Cocaine, and +Opiates, Patient Denies Repeat UDS +opiates/cocaine    9. DVT Prophylaxis SCDs  10.  Bacterial vaginosis: reported vaginal discharge in the ED, start flagyl  11: elevation of lft: acute hepatitis panel/liver US pending. Continue monitor   Code Status: FULL CODE  Family Communication: No Family Present  Disposition Plan: Inpatient Status     Consultants:  nephrology  Procedures:  none  Antibiotics:  flagyl   Objective: BP 150/92 mmHg  Pulse 54  Temp(Src) 97.5 F (36.4 C) (Oral)  Resp 16  Ht 5\' 4"  (1.626 m)  Wt 83.008 kg (183 lb)  BMI 31.40 kg/m2  SpO2 98%  LMP 06/03/2014 (Approximate)  Intake/Output Summary (Last 24 hours) at 07/04/14 1702 Last data filed at 07/04/14 0500  Gross per 24 hour  Intake    600 ml  Output   2050 ml  Net  -1450 ml   Filed Weights   07/03/14 0121  Weight: 83.008 kg (183 lb)    Exam:   General:  Drowsy, NAD  Cardiovascular: RRR  Respiratory: CTABL  Abdomen: soft/NT/ND, positive bowel sounds  Musculoskeletal: generalized tenderness, right cervical muscle edema/tenderness, left arm edema, sensation/pulse intact, does has weakness left hand.  Skin: vesicular skin eruptions right lateral neck, left face, from abrasion?  Data Reviewed: Basic Metabolic Panel:  Recent Labs Lab 07/02/14 1758 07/03/14 0752 07/04/14 0530  NA 130* 134* 134*  K 3.3* 2.6* 3.8  CL 100 96 90*  CO2 18* 28 35*  GLUCOSE 110* 133* 90  BUN 42* 38* 37*  CREATININE 8.84* 8.87* 8.44*  CALCIUM 8.2* 7.9* 7.9*  MG  --  2.1  --   PHOS  --    --  4.5   Liver Function Tests:  Recent Labs Lab 07/04/14 0530  AST 205*  ALT 109*  ALKPHOS 39  BILITOT 0.5  PROT 5.9*  ALBUMIN 2.8*   No results  for input(s): LIPASE, AMYLASE in the last 168 hours. No results for input(s): AMMONIA in the last 168 hours. CBC:  Recent Labs Lab 07/02/14 1758 07/03/14 0752 07/04/14 0530  WBC 5.8 4.6 4.9  NEUTROABS 4.3  --   --   HGB 11.4* 10.2* 10.3*  HCT 34.0* 29.7* 30.2*  MCV 92.1 89.7 89.9  PLT 367 291 273   Cardiac Enzymes:    Recent Labs Lab 07/02/14 1758 07/03/14 0018 07/04/14 0530  CKTOTAL 80998* 19519* 11935*   BNP (last 3 results) No results for input(s): BNP in the last 8760 hours.  ProBNP (last 3 results) No results for input(s): PROBNP in the last 8760 hours.  CBG: No results for input(s): GLUCAP in the last 168 hours.  Recent Results (from the past 240 hour(s))  Wet prep, genital     Status: Abnormal   Collection Time: 07/02/14  7:00 PM  Result Value Ref Range Status   Yeast Wet Prep HPF POC NONE SEEN NONE SEEN Final   Trich, Wet Prep NONE SEEN NONE SEEN Final   Clue Cells Wet Prep HPF POC FEW (A) NONE SEEN Final   WBC, Wet Prep HPF POC FEW (A) NONE SEEN Final     Studies: US Renal  07/03/2014   CLINICAL DATA:  Acute renal failure  EXAM: RENAL/URINARY TRACT ULTRASOUND COMPLETE  COMPARISON:  None.  FINDINGS: Right Kidney:  Length: 12.7 cm. Echogenicity within normal limits. No mass or hydronephrosis visualized.  Left Kidney:  Length: 13.5 cm. Echogenicity within normal limits. No mass or hydronephrosis visualized.  Bladder:  The bladder is decompressed around a Foley catheter and cannot be evaluated.  IMPRESSION: Negative right nephrosis.  No significant abnormality.   Electronically Signed   By: Jill Hopkins M.D.   On: 07/03/2014 21:07    Scheduled Meds: . amLODipine  10 mg Oral Daily  . ARIPiprazole  2 mg Oral Daily  . buPROPion  150 mg Oral BID  . ferrous sulfate  325 mg Oral Daily  . gabapentin   300 mg Oral BID  . levothyroxine  137 mcg Oral QAC breakfast  . metoprolol succinate  100 mg Oral Daily  . nortriptyline  25 mg Oral QHS  . pantoprazole  40 mg Oral QHS    Continuous Infusions: .  sodium bicarbonate 150 mEq in sterile water 1000 mL infusion 150 mEq (07/04/14 0256)       Jill Hopkins  [facility] Pager [phone]. If 7PM-7AM, please contact night-coverage at [email or web address], password [facility] 07/04/2014, 5:02 PM  LOS: 2 days       Patient's mom requested update, called her at [phone], all question answered, she expressed understanding and appreciation.

## 2014-07-05 ENCOUNTER — Inpatient Hospital Stay (HOSPITAL_COMMUNITY): Payer: Medicaid Other

## 2014-07-05 DIAGNOSIS — R609 Edema, unspecified: Secondary | ICD-10-CM

## 2014-07-05 DIAGNOSIS — R55 Syncope and collapse: Secondary | ICD-10-CM

## 2014-07-05 LAB — CBC
HEMATOCRIT: 33.3 % — AB (ref 36.0–46.0)
HEMOGLOBIN: 11.1 g/dL — AB (ref 12.0–15.0)
MCH: 30.7 pg (ref 26.0–34.0)
MCHC: 33.3 g/dL (ref 30.0–36.0)
MCV: 92 fL (ref 78.0–100.0)
PLATELETS: 318 10*3/uL (ref 150–400)
RBC: 3.62 MIL/uL — AB (ref 3.87–5.11)
RDW: 13.7 % (ref 11.5–15.5)
WBC: 6.1 10*3/uL (ref 4.0–10.5)

## 2014-07-05 LAB — COMPREHENSIVE METABOLIC PANEL
ALK PHOS: 43 U/L (ref 39–117)
ALT: 101 U/L — ABNORMAL HIGH (ref 0–35)
ANION GAP: 12 (ref 5–15)
AST: 168 U/L — AB (ref 0–37)
Albumin: 2.9 g/dL — ABNORMAL LOW (ref 3.5–5.2)
BUN: 33 mg/dL — ABNORMAL HIGH (ref 6–23)
CALCIUM: 8.2 mg/dL — AB (ref 8.4–10.5)
CO2: 36 mmol/L — ABNORMAL HIGH (ref 19–32)
Chloride: 84 mmol/L — ABNORMAL LOW (ref 96–112)
Creatinine, Ser: 7.43 mg/dL — ABNORMAL HIGH (ref 0.50–1.10)
GFR calc Af Amer: 7 mL/min — ABNORMAL LOW (ref >90)
GFR, EST NON AFRICAN AMERICAN: 6 mL/min — AB (ref >90)
GLUCOSE: 111 mg/dL — AB (ref 70–99)
Potassium: 3.3 mmol/L — ABNORMAL LOW (ref 3.5–5.1)
Sodium: 132 mmol/L — ABNORMAL LOW (ref 135–145)
Total Bilirubin: 0.5 mg/dL (ref 0.3–1.2)
Total Protein: 6.5 g/dL (ref 6.0–8.3)

## 2014-07-05 LAB — PHOSPHORUS: PHOSPHORUS: 4.2 mg/dL (ref 2.3–4.6)

## 2014-07-05 LAB — HEPATITIS PANEL, ACUTE
HCV Ab: NEGATIVE
Hep A IgM: NONREACTIVE
Hep B C IgM: NONREACTIVE
Hepatitis B Surface Ag: NEGATIVE

## 2014-07-05 MED ORDER — SODIUM BICARBONATE 8.4 % IV SOLN
150.0000 meq | INTRAVENOUS | Status: DC
  Administered 2014-07-05: 150 meq via INTRAVENOUS
  Filled 2014-07-05 (×3): qty 850

## 2014-07-05 MED ORDER — AMLODIPINE BESYLATE 5 MG PO TABS
5.0000 mg | ORAL_TABLET | Freq: Every day | ORAL | Status: DC
  Administered 2014-07-05: 5 mg via ORAL
  Filled 2014-07-05: qty 1

## 2014-07-05 MED ORDER — METOPROLOL SUCCINATE ER 50 MG PO TB24
50.0000 mg | ORAL_TABLET | Freq: Every day | ORAL | Status: DC
  Administered 2014-07-05 – 2014-07-13 (×9): 50 mg via ORAL
  Filled 2014-07-05 (×9): qty 1

## 2014-07-05 NOTE — Progress Notes (Signed)
PROGRESS NOTE  Jill Hopkins ZOX:[id number] DOB: 18-Dec-1970 DOA: 07/02/2014 PCP: Jill Mccreedy, MD  HPI/Recap of past 24 hours: C/o generalized pain, right sided neck pain, left forearm swollen, left hand weakness  Assessment/Plan: Principal Problem:   Acute kidney failure Active Problems:   Essential hypertension, benign   Rhabdomyolysis   Syncope and collapse   Skin eruption   Thyroid disease   Depression   Neck pain  1. Acute kidney failure IVFs Monitor BUN/Cr Hold ARB Rx Hold NSAIDs Renal Consulted strict intake and output, renal US no acute findings   Active Problems: 2. Rhabdomyolysis Alkalinize Urine  Bicarb drip ordered CPK level start to trend down, but still significantly elevated Hold Statin Rx   3. Syncope and collapse vs Concussion MRI of Brain- Negative for Acute Findings tele unrevealing, echocardiogram pending    4. Skin eruption vs abrasion Contact Precautions Herpetic Eruption- Check for Varicella Zoster PCR pending, HIV negative.                         Levamisole induced vasculitis? (urine positive for cocaine) consider steroids if worsening    5. Neck pain- MRI C-Spine , + Contusion of Neck S/P Fall , and  Chronic Herniated Disk with Spurring  at C-5- C-6 on Ct scan  Pain control Neurosurgery Referral at discharge   6. Essential hypertension, benign Monitor  BPs Continue Amlodipine, and Metoprolol Rx, hold hyzaar due to ARF   7. Thyroid disease Continue Levothyroxine TSH elevated   8. Depression Continue Nortriptyline, and Abilify Rx    9. +UDS- +Cocaine, and +Opiates, Patient Denies Repeat UDS +opiates/cocaine    9. DVT Prophylaxis SCDs  10.  Bacterial vaginosis: reported vaginal discharge in the ED, start flagyl  11: elevation of lft: acute hepatitis panel negative/liver US unremarkable. Continue monitor  12: mild hyponatremia, na 130 on admission, from decreased oral intake due to fall of unclear duration? Monitor.  13. Left forearm/right neck edema: likely secondary to soft tissue edema from fall, stable, left upper extremity venous duplex unremarkable, right upper extremity venous duplex pending   Code Status: FULL CODE  Family Communication: unidentified female company in room, talked to patient's mom on 3/18 at [phone], all question answered, she expressed understanding and appreciation. Disposition Plan: Inpatient Status     Consultants:  nephrology  Procedures:  none  Antibiotics:  flagyl   Objective: BP 126/81 mmHg  Pulse 59  Temp(Src) 98.6 F (37 C) (Oral)  Resp 18  Ht 5\' 4"  (1.626 m)  Wt 83.008 kg (183 lb)  BMI 31.40 kg/m2  SpO2 94%  LMP 06/03/2014 (Approximate)  Intake/Output Summary (Last 24 hours) at 07/05/14 1307 Last data filed at 07/05/14 0900  Gross per 24 hour  Intake   1200 ml  Output   2500 ml  Net  -1300 ml   Filed Weights   07/03/14 0121  Weight: 83.008 kg (183 lb)    Exam:   General:  Drowsy, NAD  Cardiovascular: RRR  Respiratory: CTABL  Abdomen: soft/NT/ND, positive bowel sounds  Musculoskeletal: generalized tenderness, right cervical muscle edema/tenderness, left arm  edema, sensation/pulse intact, does has weakness left hand.  Skin: vesicular skin eruptions right lateral neck, left face, from abrasion?  Data Reviewed: Basic Metabolic Panel:  Recent Labs Lab 07/02/14 1758 07/03/14 0752 07/04/14 0530 07/05/14 0552  NA 130* 134* 134* 132*  K 3.3* 2.6* 3.8 3.3*  CL 100 96 90* 84*  CO2 18* 28 35* 36*  GLUCOSE  110* 133* 90 111*  BUN 42* 38* 37* 33*  CREATININE 8.84* 8.87* 8.44* 7.43*  CALCIUM 8.2* 7.9* 7.9* 8.2*  MG  --  2.1  --   --   PHOS  --   --  4.5 4.2   Liver Function Tests:  Recent Labs Lab 07/04/14 0530 07/05/14 0552  AST 205* 168*  ALT 109* 101*  ALKPHOS 39 43  BILITOT 0.5 0.5  PROT 5.9* 6.5  ALBUMIN 2.8* 2.9*   No results for input(s): LIPASE, AMYLASE in the last 168 hours. No results for input(s): AMMONIA in the last 168 hours. CBC:  Recent Labs Lab 07/02/14 1758 07/03/14 0752 07/04/14 0530 07/05/14 0552  WBC 5.8 4.6 4.9 6.1  NEUTROABS 4.3  --   --   --   HGB 11.4* 10.2* 10.3* 11.1*  HCT 34.0* 29.7* 30.2* 33.3*  MCV 92.1 89.7 89.9 92.0  PLT 367 291 273 318   Cardiac Enzymes:    Recent Labs Lab 07/02/14 1758 07/03/14 0018 07/04/14 0530  CKTOTAL 91660* 19519* 11935*   BNP (last 3 results) No results for input(s): BNP in the last 8760 hours.  ProBNP (last 3 results) No results for input(s): PROBNP in the last 8760 hours.  CBG: No results for input(s): GLUCAP in the last 168 hours.  Recent Results (from the past 240 hour(s))  Wet prep, genital     Status: Abnormal   Collection Time: 07/02/14  7:00 PM  Result Value Ref Range Status   Yeast Wet Prep HPF POC NONE SEEN NONE SEEN Final   Trich, Wet Prep NONE SEEN NONE SEEN Final   Clue Cells Wet Prep HPF POC FEW (A) NONE SEEN Final   WBC, Wet Prep HPF POC FEW (A) NONE SEEN Final     Studies: No results found.  Scheduled Meds: . amLODipine  5 mg Oral Daily  . ARIPiprazole  2 mg Oral Daily  . buPROPion  150 mg Oral BID  . ferrous sulfate  325 mg  Oral Daily  . gabapentin  300 mg Oral BID  . levothyroxine  137 mcg Oral QAC breakfast  . metoprolol succinate  50 mg Oral Daily  . metroNIDAZOLE  500 mg Oral Q12H  . nortriptyline  25 mg Oral QHS  . pantoprazole  40 mg Oral QHS    Continuous Infusions: .  sodium bicarbonate 150 mEq in sterile water 1000 mL infusion 150 mEq (07/05/14 1041)       Jill Hopkins  [facility] Pager [phone]. If 7PM-7AM, please contact night-coverage at [email or web address], password [facility] 07/05/2014, 1:07 PM  LOS: 3 days

## 2014-07-05 NOTE — Progress Notes (Signed)
Subjective: Interval History: has complaints swelling of R arm and neck, worse on R , tender.  Objective: Vital signs in last 24 hours: Temp:  [97.5 F (36.4 C)-98.4 F (36.9 C)] 98.4 F (36.9 C) (03/19 0511) Pulse Rate:  [54-64] 64 (03/19 0511) Resp:  [16-18] 18 (03/19 0511) BP: (112-150)/(85-92) 112/85 mmHg (03/19 0511) SpO2:  [98 %-100 %] 99 % (03/19 0511) Weight change:   Intake/Output from previous day: 03/18 0701 - 03/19 0700 In: 720 [P.O.:720] Out: 2500 [Urine:2500] Intake/Output this shift:    General appearance: alert, cooperative and moderately obese Neck: signif induration , swelling of R side of neck extending to L but more R, extends onto R shoulderand arm Resp: diminished breath sounds bilaterally Cardio: S1, S2 normal GI: obese, pos bs,  Extremities: edema 1+ and R arm swelling,   Lab Results:  Recent Labs  07/04/14 0530 07/05/14 0552  WBC 4.9 6.1  HGB 10.3* 11.1*  HCT 30.2* 33.3*  PLT 273 318   BMET:  Recent Labs  07/04/14 0530 07/05/14 0552  NA 134* 132*  K 3.8 3.3*  CL 90* 84*  CO2 35* 36*  GLUCOSE 90 111*  BUN 37* 33*  CREATININE 8.44* 7.43*  CALCIUM 7.9* 8.2*   No results for input(s): PTH in the last 72 hours. Iron Studies:  Recent Labs  07/04/14 0530  IRON 82  TIBC 367  FERRITIN 49    Studies/Results: US Renal  07/03/2014   CLINICAL DATA:  Acute renal failure  EXAM: RENAL/URINARY TRACT ULTRASOUND COMPLETE  COMPARISON:  None.  FINDINGS: Right Kidney:  Length: 12.7 cm. Echogenicity within normal limits. No mass or hydronephrosis visualized.  Left Kidney:  Length: 13.5 cm. Echogenicity within normal limits. No mass or hydronephrosis visualized.  Bladder:  The bladder is decompressed around a Foley catheter and cannot be evaluated.  IMPRESSION: Negative right nephrosis.  No significant abnormality.   Electronically Signed   By: Andreas Newport M.D.   On: 07/03/2014 21:07    I have reviewed the patient's current  medications.  Assessment/Plan: 1 AKI slowly better, can lower ivf.  Rhabdo 2 Rside of neck and arm swelling  ? Trauma but consider venous thrombosis.  Need dopplers of neck and subclavian.  3 Obesity 4 substance abuse 5 Hypothyroid P lower ivf,eval if neck per primary (already had MRI) , follow chem, thyroid    LOS: 3 days   Jill Hopkins L 07/05/2014,9:04 AM

## 2014-07-05 NOTE — Progress Notes (Signed)
Echocardiogram 2D Echocardiogram has been performed.  Doyle Askew 07/05/2014, 3:39 PM

## 2014-07-06 ENCOUNTER — Inpatient Hospital Stay (HOSPITAL_COMMUNITY): Payer: Medicaid Other

## 2014-07-06 ENCOUNTER — Encounter (HOSPITAL_COMMUNITY): Payer: Self-pay | Admitting: Radiology

## 2014-07-06 DIAGNOSIS — M542 Cervicalgia: Secondary | ICD-10-CM

## 2014-07-06 LAB — CBC
HCT: 32.5 % — ABNORMAL LOW (ref 36.0–46.0)
Hemoglobin: 10.8 g/dL — ABNORMAL LOW (ref 12.0–15.0)
MCH: 31 pg (ref 26.0–34.0)
MCHC: 33.2 g/dL (ref 30.0–36.0)
MCV: 93.4 fL (ref 78.0–100.0)
PLATELETS: 286 10*3/uL (ref 150–400)
RBC: 3.48 MIL/uL — ABNORMAL LOW (ref 3.87–5.11)
RDW: 13.6 % (ref 11.5–15.5)
WBC: 6.3 10*3/uL (ref 4.0–10.5)

## 2014-07-06 LAB — DIFFERENTIAL
Basophils Absolute: 0 10*3/uL (ref 0.0–0.1)
Basophils Relative: 1 % (ref 0–1)
Eosinophils Absolute: 0.4 10*3/uL (ref 0.0–0.7)
Eosinophils Relative: 7 % — ABNORMAL HIGH (ref 0–5)
LYMPHS ABS: 1.1 10*3/uL (ref 0.7–4.0)
Lymphocytes Relative: 17 % (ref 12–46)
MONO ABS: 0.5 10*3/uL (ref 0.1–1.0)
Monocytes Relative: 8 % (ref 3–12)
NEUTROS PCT: 67 % (ref 43–77)
Neutro Abs: 4.3 10*3/uL (ref 1.7–7.7)

## 2014-07-06 LAB — RENAL FUNCTION PANEL
Albumin: 2.7 g/dL — ABNORMAL LOW (ref 3.5–5.2)
Anion gap: 9 (ref 5–15)
BUN: 32 mg/dL — ABNORMAL HIGH (ref 6–23)
CALCIUM: 8 mg/dL — AB (ref 8.4–10.5)
CO2: 44 mmol/L (ref 19–32)
Chloride: 81 mmol/L — ABNORMAL LOW (ref 96–112)
Creatinine, Ser: 6.54 mg/dL — ABNORMAL HIGH (ref 0.50–1.10)
GFR calc Af Amer: 8 mL/min — ABNORMAL LOW (ref >90)
GFR calc non Af Amer: 7 mL/min — ABNORMAL LOW (ref >90)
Glucose, Bld: 90 mg/dL (ref 70–99)
Phosphorus: 4.8 mg/dL — ABNORMAL HIGH (ref 2.3–4.6)
Potassium: 3.3 mmol/L — ABNORMAL LOW (ref 3.5–5.1)
Sodium: 134 mmol/L — ABNORMAL LOW (ref 135–145)

## 2014-07-06 LAB — CK: CK TOTAL: 6227 U/L — AB (ref 7–177)

## 2014-07-06 MED ORDER — SODIUM CHLORIDE 0.9 % IV SOLN
INTRAVENOUS | Status: DC
  Administered 2014-07-06: 11:00:00 via INTRAVENOUS

## 2014-07-06 MED ORDER — POTASSIUM CHLORIDE CRYS ER 20 MEQ PO TBCR
40.0000 meq | EXTENDED_RELEASE_TABLET | Freq: Once | ORAL | Status: AC
  Administered 2014-07-06: 40 meq via ORAL
  Filled 2014-07-06: qty 2

## 2014-07-06 MED ORDER — CYCLOBENZAPRINE HCL 10 MG PO TABS
5.0000 mg | ORAL_TABLET | Freq: Three times a day (TID) | ORAL | Status: DC | PRN
  Administered 2014-07-06 – 2014-07-13 (×9): 5 mg via ORAL
  Filled 2014-07-06: qty 0.5
  Filled 2014-07-06 (×9): qty 1

## 2014-07-06 MED ORDER — STERILE WATER FOR INJECTION IV SOLN: 150.0000 meq | INTRAVENOUS | Status: DC

## 2014-07-06 NOTE — Progress Notes (Signed)
Pt a/o, pt c/o pain that starts from top of her head and goes down to her neck with numbness in head, pt states she gets dizzy every time she gets up she denies vision or hearing changes, pt also stated she has been having difficulty swallowing, Dr. Erlinda Hong notified and CT was ordered as well as swallow eval, pt stable

## 2014-07-06 NOTE — Progress Notes (Signed)
Subjective: Interval History: has complaints Rside of neck is sore, feels cannot hold head up.  Objective: Vital signs in last 24 hours: Temp:  [98.3 F (36.8 C)-98.6 F (37 C)] 98.3 F (36.8 C) (03/20 0557) Pulse Rate:  [57-63] 63 (03/20 0557) Resp:  [18] 18 (03/20 0557) BP: (124-139)/(76-87) 124/76 mmHg (03/20 0557) SpO2:  [94 %-99 %] 99 % (03/20 0557) Weight change:   Intake/Output from previous day: 03/19 0701 - 03/20 0700 In: 1920 [P.O.:1920] Out: 3850 [Urine:3850] Intake/Output this shift:    General appearance: alert, cooperative and moderately obese Neck: Rside of neck swollen and indurated, extends under chin Resp: diminished breath sounds bilaterally Cardio: S1, S2 normal GI: obese, pos bs Extremities: extremities normal, atraumatic, no cyanosis or edema  Lab Results:  Recent Labs  07/05/14 0552 07/06/14 0652  WBC 6.1 6.3  HGB 11.1* 10.8*  HCT 33.3* 32.5*  PLT 318 286   BMET:  Recent Labs  07/05/14 0552 07/06/14 0652  NA 132* 134*  K 3.3* 3.3*  CL 84* 81*  CO2 36* 44*  GLUCOSE 111* 90  BUN 33* 32*  CREATININE 7.43* 6.54*  CALCIUM 8.2* 8.0*   No results for input(s): PTH in the last 72 hours. Iron Studies:  Recent Labs  07/04/14 0530  IRON 82  TIBC 367  FERRITIN 49    Studies/Results: US Abdomen Limited Ruq  07/05/2014   CLINICAL DATA:  Initial evaluation elevated liver function enzymes  EXAM: US ABDOMEN LIMITED - RIGHT UPPER QUADRANT  COMPARISON:  None.  FINDINGS: Gallbladder:  No gallstones or wall thickening visualized. No sonographic Murphy sign noted.  Common bile duct:  Diameter: 3 mm  Liver:  No focal lesion identified. Within normal limits in parenchymal echogenicity.  IMPRESSION: Normal right upper quadrant ultrasound   Electronically Signed   By: Skipper Cliche M.D.   On: 07/05/2014 09:04    I have reviewed the patient's current medications.  Assessment/Plan: 1 AKI rhabdo improving Cr. Alkalemic change to NS,  Keep urine vol  high.  Has good urine outpur 2 Fe defic 3 substance abuse 4 Neck swelling suspect vasc P iv Ns, keep urine vol high, follow Cr. Neck w/U pre primary    LOS: 4 days   Jacobi Nile L 07/06/2014,10:09 AM

## 2014-07-06 NOTE — Progress Notes (Addendum)
PROGRESS NOTE  Jill Hopkins YOV:[id number] DOB: Jul 09, 1970 DOA: 07/02/2014 PCP: Benito Mccreedy, MD  HPI/Recap of past 24 hours: Continued to c/o generalized pain, right sided neck pain, left forearm swollen, left hand weakness. States feels dizzy standing up, patient currently standing up give herself bath  Assessment/Plan: Principal Problem:   Acute kidney failure Active Problems:   Essential hypertension, benign   Rhabdomyolysis   Syncope and collapse   Skin eruption   Thyroid disease   Depression   Neck pain  1. Acute kidney failure IVFs changed from bicarb to ns due to alkosis  bun/cr trending down slowing, good urine output Hold ARB Rx Hold NSAIDs appreciate Renal input strict intake and output, renal US no acute findings   Active Problems: 2. Rhabdomyolysis Alkalinize Urine  Bicarb drip stopped due to alkalosis (co2 44 on 3/20), ns started on 3/20 CPK level  trending down,  Hold Statin Rx   3. Syncope and collapse vs Concussion MRI of Brain- Negative for Acute Findings tele unrevealing, echocardiogram no acute findings    4. Skin eruption vs abrasion Contact Precautions Herpetic Eruption- Check for Varicella Zoster PCR pending, HIV negative.                         Levamisole induced vasculitis? (urine positive for cocaine) consider steroids if worsening                         Skin exam improving on 3/20, dark spot on buttock disappeared, no skin blistering.    5. Neck pain- MRI C-Spine , + Contusion of Neck S/P Fall , and   Chronic Herniated Disk with Spurring  at C-5- C-6 on Ct scan  Pain control Neurosurgery Referral at discharge                         Reported neck pain,headache on 3/20, ct head/ct cervical spine ordered, flexeril ordered.   6. Essential hypertension, benign Monitor BPs Continue Amlodipine, and Metoprolol Rx, hold hyzaar due to ARF   7. Thyroid disease Continue Levothyroxine TSH elevated, need to repeat tsh by pmd   8. Depression Continue Nortriptyline, and Abilify Rx    9. +UDS- +Cocaine, and +Opiates, Patient Denies Repeat UDS +opiates/cocaine    9. DVT Prophylaxis SCDs  10.  Bacterial vaginosis: reported vaginal discharge in the ED, start flagyl  11: elevation of lft: acute hepatitis panel negative/liver US unremarkable. Continue monitor  12: mild hyponatremia, na 130 on admission, from decreased oral intake due to fall of unclear duration? Monitor.  13. Left forearm/right neck edema: likely secondary to soft tissue edema from fall, stable, bilateral upper extremity venous duplex no DVT.  14. Hypokalemia, replace k   15. C/o difficulty of swallowing, swallow eval ordered.  Code Status: FULL CODE  Family Communication: no family in room. updated patient's mom on 3/18 at [phone]. Disposition Plan: Inpatient Status     Consultants:  nephrology  Procedures:  none  Antibiotics:  flagyl   Objective: BP 124/76 mmHg  Pulse 63  Temp(Src) 98.3 F (36.8 C) (Oral)  Resp 18  Ht 5\' 4"  (1.626 m)  Wt 83.008 kg (183 lb)  BMI 31.40 kg/m2  SpO2 99%  LMP 06/03/2014  (Approximate)  Intake/Output Summary (Last 24 hours) at 07/06/14 1230 Last data filed at 07/06/14 0558  Gross per 24 hour  Intake   1440 ml  Output   3850  ml  Net  -2410 ml   Filed Weights   07/03/14 0121  Weight: 83.008 kg (183 lb)    Exam:   General:  alert, NAD  Cardiovascular: RRR  Respiratory: CTABL  Abdomen: soft/NT/ND, positive bowel sounds  Musculoskeletal: generalized tenderness, right cervical muscle edema/tenderness, left arm edema, sensation/pulse intact, does has weakness left hand.  Skin: vesicular skin eruptions right lateral neck, left face, from abrasion? Dark spot on buttock disappeared, skin lesion healing, no blistering observed on 3/20.  Data Reviewed: Basic Metabolic Panel:  Recent Labs Lab 07/02/14 1758 07/03/14 0752 07/04/14 0530 2014/07/07 0552 07/06/14 0652  NA 130* 134* 134* 132* 134*  K 3.3* 2.6* 3.8 3.3* 3.3*  CL 100 96 90* 84* 81*  CO2 18* 28 35* 36* 44*  GLUCOSE 110* 133* 90 111* 90  BUN 42* 38* 37* 33* 32*  CREATININE 8.84* 8.87* 8.44* 7.43* 6.54*  CALCIUM 8.2* 7.9* 7.9* 8.2* 8.0*  MG  --  2.1  --   --   --   PHOS  --   --  4.5 4.2 4.8*   Liver Function Tests:  Recent Labs Lab 07/04/14 0530 2014-07-07 0552 07/06/14 0652  AST 205* 168*  --   ALT 109* 101*  --   ALKPHOS 39 43  --   BILITOT 0.5 0.5  --   PROT 5.9* 6.5  --   ALBUMIN 2.8* 2.9* 2.7*   No results for input(s): LIPASE, AMYLASE in the last 168 hours. No results for input(s): AMMONIA in the last 168 hours. CBC:  Recent Labs Lab 07/02/14 1758 07/03/14 0752 07/04/14 0530 July 07, 2014 0552 07/06/14 0652  WBC 5.8 4.6 4.9 6.1 6.3  NEUTROABS 4.3  --   --   --  4.3  HGB 11.4* 10.2* 10.3* 11.1* 10.8*  HCT 34.0* 29.7* 30.2* 33.3* 32.5*  MCV 92.1 89.7 89.9 92.0 93.4  PLT 367 291 273 318 286   Cardiac Enzymes:    Recent Labs Lab 07/02/14 1758 07/03/14 0018 07/04/14 0530 07/06/14 0652  CKTOTAL 54627* 19519* 11935* 6227*   BNP (last 3 results) No results  for input(s): BNP in the last 8760 hours.  ProBNP (last 3 results) No results for input(s): PROBNP in the last 8760 hours.  CBG: No results for input(s): GLUCAP in the last 168 hours.  Recent Results (from the past 240 hour(s))  Wet prep, genital     Status: Abnormal   Collection Time: 07/02/14  7:00 PM  Result Value Ref Range Status   Yeast Wet Prep HPF POC NONE SEEN NONE SEEN Final   Trich, Wet Prep NONE SEEN NONE SEEN Final   Clue Cells Wet Prep HPF POC FEW (A) NONE SEEN Final   WBC, Wet Prep HPF POC FEW (A) NONE SEEN Final     Studies: US Abdomen Limited Ruq  07-Jul-2014   CLINICAL DATA:  Initial evaluation elevated liver function enzymes  EXAM: US ABDOMEN LIMITED - RIGHT UPPER QUADRANT  COMPARISON:  None.  FINDINGS: Gallbladder:  No gallstones or wall thickening visualized. No sonographic Murphy sign noted.  Common bile duct:  Diameter: 3 mm  Liver:  No focal lesion identified. Within normal limits in parenchymal echogenicity.  IMPRESSION: Normal right upper quadrant ultrasound   Electronically Signed   By: Skipper Cliche M.D.   On: 07-07-14 09:04    Scheduled Meds: . ARIPiprazole  2 mg Oral Daily  . buPROPion  150 mg Oral BID  . ferrous sulfate  325 mg Oral Daily  .  gabapentin  300 mg Oral BID  . levothyroxine  137 mcg Oral QAC breakfast  . metoprolol succinate  50 mg Oral Daily  . metroNIDAZOLE  500 mg Oral Q12H  . nortriptyline  25 mg Oral QHS  . pantoprazole  40 mg Oral QHS    Continuous Infusions: . sodium chloride 50 mL/hr at 07/06/14 [facility] Pager [phone]. If 7PM-7AM, please contact night-coverage at [email or web address], password [facility] 07/06/2014, 12:30 PM  LOS: 4 days

## 2014-07-06 NOTE — Progress Notes (Signed)
VASCULAR LAB PRELIMINARY  PRELIMINARY  PRELIMINARY  PRELIMINARY  Bilateral upper extremity venous Dopplers completed.    Preliminary report:  There is no obvious evidence of DVT or SVT noted in the bilateral upper extremities. There is no DVT noted in the internal jugular or subclavian veins, bilaterally.  Dakoda Laventure, RVT 07/06/2014, 11:04 AM

## 2014-07-07 DIAGNOSIS — R21 Rash and other nonspecific skin eruption: Secondary | ICD-10-CM

## 2014-07-07 LAB — FOLATE RBC
Folate, Hemolysate: 534 ng/mL
Folate, RBC: 1679 ng/mL (ref >498)
Hematocrit: 31.8 % — ABNORMAL LOW (ref 34.0–46.6)

## 2014-07-07 LAB — HEPATIC FUNCTION PANEL
ALT: 68 U/L — ABNORMAL HIGH (ref 0–35)
AST: 103 U/L — AB (ref 0–37)
Albumin: 2.6 g/dL — ABNORMAL LOW (ref 3.5–5.2)
Alkaline Phosphatase: 38 U/L — ABNORMAL LOW (ref 39–117)
BILIRUBIN DIRECT: 0.2 mg/dL (ref 0.0–0.5)
Indirect Bilirubin: 0.4 mg/dL (ref 0.3–0.9)
Total Bilirubin: 0.6 mg/dL (ref 0.3–1.2)
Total Protein: 6 g/dL (ref 6.0–8.3)

## 2014-07-07 LAB — RENAL FUNCTION PANEL
ANION GAP: 8 (ref 5–15)
Albumin: 2.7 g/dL — ABNORMAL LOW (ref 3.5–5.2)
BUN: 30 mg/dL — AB (ref 6–23)
CHLORIDE: 90 mmol/L — AB (ref 96–112)
CO2: 37 mmol/L — ABNORMAL HIGH (ref 19–32)
CREATININE: 5.62 mg/dL — AB (ref 0.50–1.10)
Calcium: 7.7 mg/dL — ABNORMAL LOW (ref 8.4–10.5)
GFR calc Af Amer: 10 mL/min — ABNORMAL LOW (ref >90)
GFR calc non Af Amer: 9 mL/min — ABNORMAL LOW (ref >90)
GLUCOSE: 89 mg/dL (ref 70–99)
PHOSPHORUS: 4.7 mg/dL — AB (ref 2.3–4.6)
POTASSIUM: 3.4 mmol/L — AB (ref 3.5–5.1)
SODIUM: 135 mmol/L (ref 135–145)

## 2014-07-07 LAB — CBC
HEMATOCRIT: 29.8 % — AB (ref 36.0–46.0)
Hemoglobin: 9.9 g/dL — ABNORMAL LOW (ref 12.0–15.0)
MCH: 31.2 pg (ref 26.0–34.0)
MCHC: 33.2 g/dL (ref 30.0–36.0)
MCV: 94 fL (ref 78.0–100.0)
PLATELETS: 259 10*3/uL (ref 150–400)
RBC: 3.17 MIL/uL — ABNORMAL LOW (ref 3.87–5.11)
RDW: 13.7 % (ref 11.5–15.5)
WBC: 5.4 10*3/uL (ref 4.0–10.5)

## 2014-07-07 LAB — CK: CK TOTAL: 4637 U/L — AB (ref 7–177)

## 2014-07-07 MED ORDER — HYDROMORPHONE HCL 1 MG/ML IJ SOLN
0.5000 mg | Freq: Four times a day (QID) | INTRAMUSCULAR | Status: DC | PRN
  Administered 2014-07-07 – 2014-07-11 (×11): 0.5 mg via INTRAVENOUS
  Filled 2014-07-07 (×11): qty 1

## 2014-07-07 NOTE — Progress Notes (Signed)
PROGRESS NOTE  Arwen Haseley BMW:[id number] DOB: Sep 18, 1970 DOA: 07/02/2014 PCP: Benito Mccreedy, MD  HPI/Recap of past 24 hours:  feeling better today, c/o being "swollen"  Assessment/Plan: Principal Problem:   Acute kidney failure Active Problems:   Essential hypertension, benign   Rhabdomyolysis   Syncope and collapse   Skin eruption   Thyroid disease   Depression   Neck pain  1. Acute kidney failure renal US no acute findings                          Hold ARB Rx/NSAIDS  Received iv bicarb then switched to NS due to alkalosis,                           ivf d/ced on 3/21 due to c/o more swollen    bun/cr trending down, good urine output, encourage oral intake/ambulation   appreciate Renal input, foley discontinued per renal rec on 3/21    Active Problems: 2. Rhabdomyolysis Hold Statin Rx                         s/pBicarb drip then NS, ivf d/ced on 3/21  CPK level  trending down,     3. Syncope and collapse vs Concussion MRI of Brain- Negative for Acute Findings tele unrevealing, echocardiogram no acute findings    4. Skin eruption vs abrasion Contact Precautions Herpetic Eruption- Check for Varicella Zoster PCR pending, HIV negative.                         Levamisole induced vasculitis? (urine positive for cocaine) consider steroids if worsening                         Skin exam improving on 3/20, dark spot on buttock disappeared, no skin blistering.    5. Neck pain- MRI C-Spine , + Contusion of Neck S/P Fall , and  Chronic Herniated Disk with  Spurring  at C-5- C-6 on Ct scan  Pain control Neurosurgery Referral at discharge                         Reported neck pain,headache on 3/20, ct head/ct cervical spine no acute finding, again showed soft tissue edema, flexeril ordered.   6. Essential hypertension, benign Monitor BPs Continue Metoprolol Rx, hold hyzaar due to ARF, hold norvasc due to edema   7. Thyroid disease Continue Levothyroxine TSH elevated, need to repeat tsh by pmd                           8. Depression Continue Nortriptyline, and Abilify Rx    9. +UDS- +Cocaine, and +Opiates, Patient Denies Repeat UDS +opiates/cocaine    9. DVT Prophylaxis SCDs  10.  Bacterial vaginosis: reported vaginal discharge in the ED, start flagyl  11: elevation of lft: acute hepatitis panel negative/liver US unremarkable. Continue monitor  12: mild hyponatremia, na 130 on admission, from decreased oral intake due to fall of unclear duration? Monitor.  13. Left forearm/right neck edema: likely secondary to soft tissue edema from fall, stable, bilateral upper extremity venous duplex no DVT.  14. Hypokalemia, replace k   15. C/o difficulty of swallowing, swallow eval unremarkable  16. Right hand weakness/edema: sensation intact, good pulse, no DVT  by venous duplex, OT consulted  Code Status: FULL CODE  Family Communication: no family in room. updated patient's mom on 3/18 at [phone]. Disposition Plan: Inpatient Status     Consultants:  nephrology  Procedures:  none  Antibiotics:  flagyl   Objective: BP 139/81 mmHg  Pulse 62  Temp(Src) 97.7 F (36.5 C) (Oral)  Resp 20  Ht  5\' 4"  (1.626 m)  Wt 83.008 kg (183 lb)  BMI 31.40 kg/m2  SpO2 100%  LMP 06/03/2014 (Approximate)  Intake/Output Summary (Last 24 hours) at 07/07/14 1411 Last data filed at 07/07/14 5284  Gross per 24 hour  Intake   2650 ml  Output   1850 ml  Net    800 ml   Filed Weights   07/03/14 0121  Weight: 83.008 kg (183 lb)    Exam:   General:  alert, NAD  Cardiovascular: RRR  Respiratory: CTABL  Abdomen: soft/NT/ND, positive bowel sounds  Musculoskeletal: generalized tenderness, right cervical muscle edema/tenderness, left arm edema, sensation/pulse intact, does has weakness left hand.  Skin: vesicular skin eruptions right lateral neck, left face, from abrasion? Dark spot on buttock disappeared, skin lesion healing, no blistering observed since 3/20.  Data Reviewed: Basic Metabolic Panel:  Recent Labs Lab 07/03/14 0752 07/04/14 0530 07/05/14 0552 07/06/14 0652 07/07/14 0730  NA 134* 134* 132* 134* 135  K 2.6* 3.8 3.3* 3.3* 3.4*  CL 96 90* 84* 81* 90*  CO2 28 35* 36* 44* 37*  GLUCOSE 133* 90 111* 90 89  BUN 38* 37* 33* 32* 30*  CREATININE 8.87* 8.44* 7.43* 6.54* 5.62*  CALCIUM 7.9* 7.9* 8.2* 8.0* 7.7*  MG 2.1  --   --   --   --   PHOS  --  4.5 4.2 4.8* 4.7*   Liver Function Tests:  Recent Labs Lab 07/04/14 0530 07/05/14 0552 07/06/14 0652 07/07/14 0730  AST 205* 168*  --  103*  ALT 109* 101*  --  68*  ALKPHOS 39 43  --  38*  BILITOT 0.5 0.5  --  0.6  PROT 5.9* 6.5  --  6.0  ALBUMIN 2.8* 2.9* 2.7* 2.6*  2.7*   No results for input(s): LIPASE, AMYLASE in the last 168 hours. No results for input(s): AMMONIA in the last 168 hours. CBC:  Recent Labs Lab 07/02/14 1758 07/03/14 0752 07/04/14 0530 07/05/14 0552 07/06/14 0652 07/07/14 0730  WBC 5.8 4.6 4.9 6.1 6.3 5.4  NEUTROABS 4.3  --   --   --  4.3  --   HGB 11.4* 10.2* 10.3* 11.1* 10.8* 9.9*  HCT 34.0* 29.7* 30.2* 33.3* 32.5* 29.8*  MCV 92.1 89.7 89.9 92.0 93.4 94.0  PLT 367 291 273 318 286 259    Cardiac Enzymes:    Recent Labs Lab 07/02/14 1758 07/03/14 0018 07/04/14 0530 07/06/14 0652 07/07/14 0730  CKTOTAL 13244* 19519* 11935* 6227* 4637*   BNP (last 3 results) No results for input(s): BNP in the last 8760 hours.  ProBNP (last 3 results) No results for input(s): PROBNP in the last 8760 hours.  CBG: No results for input(s): GLUCAP in the last 168 hours.  Recent Results (from the past 240 hour(s))  Wet prep, genital     Status: Abnormal   Collection Time: 07/02/14  7:00 PM  Result Value Ref Range Status   Yeast Wet Prep HPF POC NONE SEEN NONE SEEN Final   Trich, Wet Prep NONE SEEN NONE SEEN Final   Clue Cells Wet Prep HPF POC FEW (A)  NONE SEEN Final   WBC, Wet Prep HPF POC FEW (A) NONE SEEN Final     Studies: Ct Head Wo Contrast  07/06/2014   CLINICAL DATA:  H/o depression, renal disorder, hypertension, hypothyroid, 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 She reports that she fell in the bathroom and woke up on the floor the next morning. She does not remember why she fell, she denies drinking alcohol or using drugs. She reports having lesions to the side of her neck and face after the fall. She complains of pain over her right neck. She reports feeling like her hand cannot straighten out. She denies recent illness. She does not take any medications currently for herpes. She does report some vaginal discharge for 3 weeks. The patient denies fevers, cough, wheezing, shortness of breath, vaginal lesions, vaginal leading, dysuria, hematuria, urinary frequency, urinary urgency, or difficulty walking  EXAM: CT HEAD WITHOUT CONTRAST  CT CERVICAL SPINE WITHOUT CONTRAST  TECHNIQUE: Multidetector CT imaging of the head and cervical spine was performed following the standard protocol without intravenous contrast. Multiplanar  CT image reconstructions of the cervical spine were also generated.  COMPARISON:  06/03/2011  FINDINGS: CT HEAD FINDINGS  Ventricles are normal in size and configuration. No parenchymal masses or mass effect. No evidence of an infarct. There are no extra-axial masses or abnormal fluid collections.  No intracranial hemorrhage.  No skull fracture. Visualized sinuses and mastoid air cells are clear.  There is low attenuation within the right superior posterior paraspinal musculature with some overlying subcutaneous edema.  CT CERVICAL SPINE FINDINGS  There is low-attenuation mild thickening of the right posterior paraspinal muscles as well as a right medial trapezius. This may be due to muscle strain. Subcutaneous edema is noted along the sub posterior neck soft tissues, greater on the right as well as the right side of the face and neck extending to the upper chest, again greater on the right.  No soft tissue mass, hematoma or adenopathy.  There is no evidence of fracture. No spondylolisthesis. There is moderate loss disc height at C5-C6. Endplate spurring is noted at this level. No evidence of a disc herniation. Neural foramina are well preserved.  IMPRESSION: 1. No intracranial abnormality.  No skull fracture. 2. Abnormal low attenuation throughout the right posterior paraspinal musculature. There is also subcutaneous edema which is most evident along the right posterior lateral neck right face extending to the right upper chest. The low-attenuation in the right posterior paraspinal musculature is likely muscle edema from a strain, but is nonspecific. 3. No cervical fracture or spondylolisthesis.   Electronically Signed   By: Lajean Manes M.D.   On: 07/06/2014 15:14   Ct Cervical Spine Wo Contrast  07/06/2014   CLINICAL DATA:  H/o depression, renal disorder, hypertension, hypothyroid, and HSV-2 infection who presents to the ED complaining of a fall 06/28/14 where she laid on the ground for almost 12 hours and  complaining of right neck pain, her left arm "not working" and butt pain. She reports that she fell in the bathroom and woke up on the floor the next morning. She does not remember why she fell, she denies drinking alcohol or using drugs. She reports having lesions to the side of her neck and face after the fall. She complains of pain over her right neck.  She reports feeling like her hand cannot straighten out. She denies recent illness. She does not take any medications currently for herpes. She does report some vaginal discharge for 3 weeks. The patient denies fevers, cough, wheezing, shortness of breath, vaginal lesions, vaginal leading, dysuria, hematuria, urinary frequency, urinary urgency, or difficulty walking  EXAM: CT HEAD WITHOUT CONTRAST  CT CERVICAL SPINE WITHOUT CONTRAST  TECHNIQUE: Multidetector CT imaging of the head and cervical spine was performed following the standard protocol without intravenous contrast. Multiplanar CT image reconstructions of the cervical spine were also generated.  COMPARISON:  06/03/2011  FINDINGS: CT HEAD FINDINGS  Ventricles are normal in size and configuration. No parenchymal masses or mass effect. No evidence of an infarct. There are no extra-axial masses or abnormal fluid collections.  No intracranial hemorrhage.  No skull fracture. Visualized sinuses and mastoid air cells are clear.  There is low attenuation within the right superior posterior paraspinal musculature with some overlying subcutaneous edema.  CT CERVICAL SPINE FINDINGS  There is low-attenuation mild thickening of the right posterior paraspinal muscles as well as a right medial trapezius. This may be due to muscle strain. Subcutaneous edema is noted along the sub posterior neck soft tissues, greater on the right as well as the right side of the face and neck extending to the upper chest, again greater on the right.  No soft tissue mass, hematoma or adenopathy.  There is no evidence of fracture. No  spondylolisthesis. There is moderate loss disc height at C5-C6. Endplate spurring is noted at this level. No evidence of a disc herniation. Neural foramina are well preserved.  IMPRESSION: 1. No intracranial abnormality.  No skull fracture. 2. Abnormal low attenuation throughout the right posterior paraspinal musculature. There is also subcutaneous edema which is most evident along the right posterior lateral neck right face extending to the right upper chest. The low-attenuation in the right posterior paraspinal musculature is likely muscle edema from a strain, but is nonspecific. 3. No cervical fracture or spondylolisthesis.   Electronically Signed   By: Lajean Manes M.D.   On: 07/06/2014 15:14    Scheduled Meds: . ARIPiprazole  2 mg Oral Daily  . buPROPion  150 mg Oral BID  . ferrous sulfate  325 mg Oral Daily  . gabapentin  300 mg Oral BID  . levothyroxine  137 mcg Oral QAC breakfast  . metoprolol succinate  50 mg Oral Daily  . metroNIDAZOLE  500 mg Oral Q12H  . nortriptyline  25 mg Oral QHS  . pantoprazole  40 mg Oral QHS    Continuous Infusions:       Jochebed Bills  [facility] Pager [phone]. If 7PM-7AM, please contact night-coverage at [email or web address], password [email or web address] 07/07/2014, 2:11 PM  LOS: 5 days

## 2014-07-07 NOTE — Progress Notes (Addendum)
As per policy, if patient has suspected active shingles - needs to be on Airborne/Contact precautions. If suspected lesions are dry and scabbed - does not require any precautions at this time. Luther Hearing RN BSN Infection Prevention

## 2014-07-07 NOTE — Progress Notes (Signed)
[facility] ROUNDING NOTE   Subjective:   Interval History: no complaints this morning   Objective:  Vital signs in last 24 hours:  Temp:  [98 F (36.7 C)-98.1 F (36.7 C)] 98.1 F (36.7 C) (03/21 0623) Pulse Rate:  [62-82] 82 (03/21 0623) Resp:  [18] 18 (03/21 0623) BP: (99-137)/(82-88) 99/82 mmHg (03/21 0623) SpO2:  [97 %-100 %] 100 % (03/21 0623)  Weight change:  Filed Weights   07/03/14 0121  Weight: 83.008 kg (183 lb)    Intake/Output: I/O last 3 completed shifts: In: 4709 [P.O.:3240; I.V.:930] Out: 6650 [Urine:6650]   Intake/Output this shift:    Swelling R neck  CVS- RRR RS- CTA ABD- BS present soft non-distended EXT-  1 + edema   Basic Metabolic Panel:  Recent Labs Lab 07/03/14 0752 07/04/14 0530 07/05/14 0552 07/06/14 0652 07/07/14 0730  NA 134* 134* 132* 134* 135  K 2.6* 3.8 3.3* 3.3* 3.4*  CL 96 90* 84* 81* 90*  CO2 28 35* 36* 44* 37*  GLUCOSE 133* 90 111* 90 89  BUN 38* 37* 33* 32* 30*  CREATININE 8.87* 8.44* 7.43* 6.54* 5.62*  CALCIUM 7.9* 7.9* 8.2* 8.0* 7.7*  MG 2.1  --   --   --   --   PHOS  --  4.5 4.2 4.8* 4.7*    Liver Function Tests:  Recent Labs Lab 07/04/14 0530 07/05/14 0552 07/06/14 0652 07/07/14 0730  AST 205* 168*  --  103*  ALT 109* 101*  --  68*  ALKPHOS 39 43  --  38*  BILITOT 0.5 0.5  --  0.6  PROT 5.9* 6.5  --  6.0  ALBUMIN 2.8* 2.9* 2.7* 2.6*  2.7*   No results for input(s): LIPASE, AMYLASE in the last 168 hours. No results for input(s): AMMONIA in the last 168 hours.  CBC:  Recent Labs Lab 07/02/14 1758 07/03/14 0752 07/04/14 0530 07/05/14 0552 07/06/14 0652 07/07/14 0730  WBC 5.8 4.6 4.9 6.1 6.3 5.4  NEUTROABS 4.3  --   --   --  4.3  --   HGB 11.4* 10.2* 10.3* 11.1* 10.8* 9.9*  HCT 34.0* 29.7* 30.2* 33.3* 32.5* 29.8*  MCV 92.1 89.7 89.9 92.0 93.4 94.0  PLT 367 291 273 318 286 259    Cardiac Enzymes:  Recent Labs Lab 07/02/14 1758 07/03/14 0018 07/04/14 0530  07/06/14 0652 07/07/14 0730  CKTOTAL 62836* 19519* 11935* 6227* 4637*    BNP: Invalid input(s): POCBNP  CBG: No results for input(s): GLUCAP in the last 168 hours.  Microbiology: Results for orders placed or performed during the hospital encounter of 07/02/14  Wet prep, genital     Status: Abnormal   Collection Time: 07/02/14  7:00 PM  Result Value Ref Range Status   Yeast Wet Prep HPF POC NONE SEEN NONE SEEN Final   Trich, Wet Prep NONE SEEN NONE SEEN Final   Clue Cells Wet Prep HPF POC FEW (A) NONE SEEN Final   WBC, Wet Prep HPF POC FEW (A) NONE SEEN Final    Coagulation Studies: No results for input(s): LABPROT, INR in the last 72 hours.  Urinalysis: No results for input(s): COLORURINE, LABSPEC, PHURINE, GLUCOSEU, HGBUR, BILIRUBINUR, KETONESUR, PROTEINUR, UROBILINOGEN, NITRITE, LEUKOCYTESUR in the last 72 hours.  Invalid input(s): APPERANCEUR    Imaging: Ct Head Wo Contrast  07/06/2014   CLINICAL DATA:  H/o depression, renal disorder, hypertension, hypothyroid, and HSV-2 infection who presents to the ED complaining of a fall 06/28/14 where she laid  on the ground for almost 12 hours and complaining of right neck pain, her left arm "not working" and butt pain. She reports that she fell in the bathroom and woke up on the floor the next morning. She does not remember why she fell, she denies drinking alcohol or using drugs. She reports having lesions to the side of her neck and face after the fall. She complains of pain over her right neck. She reports feeling like her hand cannot straighten out. She denies recent illness. She does not take any medications currently for herpes. She does report some vaginal discharge for 3 weeks. The patient denies fevers, cough, wheezing, shortness of breath, vaginal lesions, vaginal leading, dysuria, hematuria, urinary frequency, urinary urgency, or difficulty walking  EXAM: CT HEAD WITHOUT CONTRAST  CT CERVICAL SPINE WITHOUT CONTRAST  TECHNIQUE:  Multidetector CT imaging of the head and cervical spine was performed following the standard protocol without intravenous contrast. Multiplanar CT image reconstructions of the cervical spine were also generated.  COMPARISON:  06/03/2011  FINDINGS: CT HEAD FINDINGS  Ventricles are normal in size and configuration. No parenchymal masses or mass effect. No evidence of an infarct. There are no extra-axial masses or abnormal fluid collections.  No intracranial hemorrhage.  No skull fracture. Visualized sinuses and mastoid air cells are clear.  There is low attenuation within the right superior posterior paraspinal musculature with some overlying subcutaneous edema.  CT CERVICAL SPINE FINDINGS  There is low-attenuation mild thickening of the right posterior paraspinal muscles as well as a right medial trapezius. This may be due to muscle strain. Subcutaneous edema is noted along the sub posterior neck soft tissues, greater on the right as well as the right side of the face and neck extending to the upper chest, again greater on the right.  No soft tissue mass, hematoma or adenopathy.  There is no evidence of fracture. No spondylolisthesis. There is moderate loss disc height at C5-C6. Endplate spurring is noted at this level. No evidence of a disc herniation. Neural foramina are well preserved.  IMPRESSION: 1. No intracranial abnormality.  No skull fracture. 2. Abnormal low attenuation throughout the right posterior paraspinal musculature. There is also subcutaneous edema which is most evident along the right posterior lateral neck right face extending to the right upper chest. The low-attenuation in the right posterior paraspinal musculature is likely muscle edema from a strain, but is nonspecific. 3. No cervical fracture or spondylolisthesis.   Electronically Signed   By: Lajean Manes M.D.   On: 07/06/2014 15:14   Ct Cervical Spine Wo Contrast  07/06/2014   CLINICAL DATA:  H/o depression, renal disorder,  hypertension, hypothyroid, 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 She reports that she fell in the bathroom and woke up on the floor the next morning. She does not remember why she fell, she denies drinking alcohol or using drugs. She reports having lesions to the side of her neck and face after the fall. She complains of pain over her right neck. She reports feeling like her hand cannot straighten out. She denies recent illness. She does not take any medications currently for herpes. She does report some vaginal discharge for 3 weeks. The patient denies fevers, cough, wheezing, shortness of breath, vaginal lesions, vaginal leading, dysuria, hematuria, urinary frequency, urinary urgency, or difficulty walking  EXAM: CT HEAD WITHOUT CONTRAST  CT CERVICAL SPINE WITHOUT CONTRAST  TECHNIQUE: Multidetector CT imaging of the head and cervical spine was performed following the standard protocol without intravenous contrast. Multiplanar CT image reconstructions of the cervical spine were also generated.  COMPARISON:  06/03/2011  FINDINGS: CT HEAD FINDINGS  Ventricles are normal in size and configuration. No parenchymal masses or mass effect. No evidence of an infarct. There are no extra-axial masses or abnormal fluid collections.  No intracranial hemorrhage.  No skull fracture. Visualized sinuses and mastoid air cells are clear.  There is low attenuation within the right superior posterior paraspinal musculature with some overlying subcutaneous edema.  CT CERVICAL SPINE FINDINGS  There is low-attenuation mild thickening of the right posterior paraspinal muscles as well as a right medial trapezius. This may be due to muscle strain. Subcutaneous edema is noted along the sub posterior neck soft tissues, greater on the right as well as the right side of the face and  neck extending to the upper chest, again greater on the right.  No soft tissue mass, hematoma or adenopathy.  There is no evidence of fracture. No spondylolisthesis. There is moderate loss disc height at C5-C6. Endplate spurring is noted at this level. No evidence of a disc herniation. Neural foramina are well preserved.  IMPRESSION: 1. No intracranial abnormality.  No skull fracture. 2. Abnormal low attenuation throughout the right posterior paraspinal musculature. There is also subcutaneous edema which is most evident along the right posterior lateral neck right face extending to the right upper chest. The low-attenuation in the right posterior paraspinal musculature is likely muscle edema from a strain, but is nonspecific. 3. No cervical fracture or spondylolisthesis.   Electronically Signed   By: Lajean Manes M.D.   On: 07/06/2014 15:14     Medications:   . sodium chloride 50 mL/hr at 07/07/14 0600   . ARIPiprazole  2 mg Oral Daily  . buPROPion  150 mg Oral BID  . ferrous sulfate  325 mg Oral Daily  . gabapentin  300 mg Oral BID  . levothyroxine  137 mcg Oral QAC breakfast  . metoprolol succinate  50 mg Oral Daily  . metroNIDAZOLE  500 mg Oral Q12H  . nortriptyline  25 mg Oral QHS  . pantoprazole  40 mg Oral QHS   acetaminophen **OR** acetaminophen, cyclobenzaprine, HYDROmorphone (DILAUDID) injection, ondansetron **OR** ondansetron (ZOFRAN) IV, zolpidem  Assessment/ Plan:   AKI slowly improving will aim to D/C catheter  Thought to be due to rhabdomyolysis. CT of neck demonstrates subcutaneous edema. History of substance abuse  Creatinine improved  -- avoid any nephrotoxins   Will sign off   LOS: 5 Hazen Brumett W @TODAY @11 :25 AM

## 2014-07-07 NOTE — Progress Notes (Signed)
Pt has slept most of the night with minimal complaints. She and her boyfriend have eaten large quantities of food prior to her sleeping. She continues to complain of pain in her left arm, but has good CMS. She is requesting that the foley catheter be removed because it is irritating her.

## 2014-07-07 NOTE — Progress Notes (Signed)
Observed abrasion on face/ Dr. Senaida Lange D/C isolation

## 2014-07-07 NOTE — Evaluation (Signed)
Clinical/Bedside Swallow Evaluation Patient Details  Name: Jill Hopkins MRN: [id number] Date of Birth: [birth year]  Today's Date: 07/07/2014 Time: SLP Start Time (ACUTE ONLY): 1205 SLP Stop Time (ACUTE ONLY): 1217 SLP Time Calculation (min) (ACUTE ONLY): 12 min  Past Medical History:  Past Medical History  Diagnosis Date  . Hypertension   . Thyroid disease   . Depression   . Renal disorder    Past Surgical History:  Past Surgical History  Procedure Laterality Date  . Cervical cerclage     HPI:  44 yo female who presented with neck pain s/p fall, after which she laid on the floor for 12 hours. Pt with c/o difficulty swallowing.   Assessment / Plan / Recommendation Clinical Impression  Pt has subjective c/o food getting "stuck" in her throat, requiring smaller bites, more thorough mastication, and liquid washes. Suspect that this may be due to possible edema s/p fall and/or esophaeal component. She does not show overt signs of aspiration across consistencies tested. Would continue with a regular diet and thin liquids with continued use of aforementioned compensatory strategies. Would suspect gradual resolution of symptoms should they be attributable to fall - if symptoms persist, MD may wish to pursue esophageal w/u. SLP to sign off at this time.    Aspiration Risk  Mild    Diet Recommendation Regular;Thin liquid   Liquid Administration via: Cup;Straw Medication Administration: Whole meds with puree Supervision: Patient able to self feed Compensations: Slow rate;Small sips/bites;Follow solids with liquid Postural Changes and/or Swallow Maneuvers: Seated upright 90 degrees;Upright 30-60 min after meal    Other  Recommendations Oral Care Recommendations: Oral care BID   Follow Up Recommendations  None    Frequency and Duration        Pertinent Vitals/Pain Pt reports 10/10 pain everywhere, that RN is aware and already administered medications; monitored throughout session     SLP Swallow Goals     Swallow Study Prior Functional Status       General HPI: 45 yo female who presented with neck pain s/p fall, after which she laid on the floor for 12 hours. Pt with c/o difficulty swallowing. Type of Study: Bedside swallow evaluation Previous Swallow Assessment: none in chart Diet Prior to this Study: Regular;Thin liquids Temperature Spikes Noted: No Respiratory Status: Room air History of Recent Intubation: No Behavior/Cognition: Alert;Cooperative;Pleasant mood Oral Cavity - Dentition: Adequate natural dentition Self-Feeding Abilities: Able to feed self Patient Positioning: Upright in bed Baseline Vocal Quality: Clear    Oral/Motor/Sensory Function Overall Oral Motor/Sensory Function: Appears within functional limits for tasks assessed   Ice Chips Ice chips: Not tested   Thin Liquid Thin Liquid: Within functional limits Presentation: Self Fed;Straw    Nectar Thick Nectar Thick Liquid: Not tested   Honey Thick Honey Thick Liquid: Not tested   Puree Puree: Not tested   Solid    Solid: Impaired Presentation: Self Fed Oral Phase Impairments: Other (comment) (prolonged mastication)      Germain Osgood, M.A. CCC-SLP [phone]  Germain Osgood 07/07/2014,12:34 PM

## 2014-07-08 DIAGNOSIS — I10 Essential (primary) hypertension: Secondary | ICD-10-CM

## 2014-07-08 LAB — BASIC METABOLIC PANEL
ANION GAP: 9 (ref 5–15)
BUN: 28 mg/dL — ABNORMAL HIGH (ref 6–23)
CHLORIDE: 89 mmol/L — AB (ref 96–112)
CO2: 36 mmol/L — AB (ref 19–32)
CREATININE: 5.18 mg/dL — AB (ref 0.50–1.10)
Calcium: 7.6 mg/dL — ABNORMAL LOW (ref 8.4–10.5)
GFR calc Af Amer: 11 mL/min — ABNORMAL LOW (ref >90)
GFR calc non Af Amer: 9 mL/min — ABNORMAL LOW (ref >90)
Glucose, Bld: 138 mg/dL — ABNORMAL HIGH (ref 70–99)
Potassium: 3.4 mmol/L — ABNORMAL LOW (ref 3.5–5.1)
Sodium: 134 mmol/L — ABNORMAL LOW (ref 135–145)

## 2014-07-08 LAB — CK: CK TOTAL: 3999 U/L — AB (ref 7–177)

## 2014-07-08 LAB — VARICELLA-ZOSTER BY PCR: Varicella-Zoster, PCR: NEGATIVE

## 2014-07-08 LAB — DRUG SCREEN PANEL (SERUM)

## 2014-07-08 NOTE — Progress Notes (Signed)
PROGRESS NOTE  Jill Hopkins UDJ:[id number] DOB: July 21, 1970 DOA: 07/02/2014 PCP: Benito Mccreedy, MD  HPI/Recap of past 24 hours:   c/o being "swollen", left hand weakness, right sided neck pain  Assessment/Plan: Principal Problem:   Acute kidney failure Active Problems:   Essential hypertension, benign   Rhabdomyolysis   Syncope and collapse   Skin eruption   Thyroid disease   Depression   Neck pain  1. Acute kidney failure renal US no acute findings                          Hold ARB Rx/NSAIDS  Received iv bicarb then switched to NS due to alkalosis,                           ivf d/ced on 3/21 due to c/o more swollen    bun/cr trending down, good urine output, encourage oral intake/ambulation   appreciate Renal input, foley discontinued per renal rec on 3/21    Active Problems: 2. Rhabdomyolysis Hold Statin Rx                         s/pBicarb drip then NS, ivf d/ced on 3/21  CPK level  trending down,     3. Syncope and collapse vs Concussion MRI of Brain- Negative for Acute Findings tele unrevealing, echocardiogram no acute findings    4. Skin eruption vs abrasion Varicella Zoster PCR negative, HIV negative.Contact Precautions d/ed                         Levamisole induced vasculitis? (urine positive for cocaine)                          Skin exam improving on 3/20, dark spot on buttock disappeared, no skin blistering.    5. Neck pain- MRI C-Spine , + Contusion of Neck S/P Fall , and  Chronic Herniated Disk with Spurring  at  C-5- C-6 on Ct scan  Pain control Neurosurgery Referral at discharge                         Reported neck pain,headache on 3/20, ct head/ct cervical spine no acute finding, again showed soft tissue edema.   6. Essential hypertension, benign BP stable on toprol XL alone,  hold hyzaar due to ARF, hold norvasc due to edema   7. Thyroid disease Continue Levothyroxine TSH elevated, need to repeat tsh by pmd                           8. Depression Continue Nortriptyline, and Abilify Rx    9. +UDS- +Cocaine, and +Opiates, Patient Denies Repeat UDS +opiates/cocaine    9. DVT Prophylaxis SCDs  10.  Bacterial vaginosis: reported vaginal discharge in the ED, start flagyl day 3 of 7day treatment  11: elevation of lft: acute hepatitis panel negative/liver US unremarkable. Trending down.  12: mild hyponatremia, na 130 on admission, from decreased oral intake due to fall of unclear duration? Monitor.  13. Left forearm/right neck edema: likely secondary to soft tissue edema from fall, stable, bilateral upper extremity venous duplex no DVT.  14. Hypokalemia, replace k   15. C/o difficulty of swallowing, swallow eval unremarkable  16. left hand weakness/edema: sensation intact, good  pulse, no DVT by venous duplex, OT consulted  17: Generalized edema secondary to aggressive hydration for rhabdomyeolysis: ivf d/ced, encourage ambulation, consider lasix if edema persist.  Code Status: FULL CODE  Family Communication: female company in room. updated patient's mom on 3/18 at [phone]. Disposition Plan: likely d/c in 1-2 days, discharge barrier, medical follow up noncompliance, reported has not seem PMD in last year, not sure if  patient is compliant with meds either, h/o cocaine use, but vehemently denies. Syncope likely drug use related.    Consultants:  nephrology  Procedures:  none  Antibiotics:  flagyl   Objective: BP 122/78 mmHg  Pulse 65  Temp(Src) 97.6 F (36.4 C) (Oral)  Resp 20  Ht 5\' 4"  (1.626 m)  Wt 83.008 kg (183 lb)  BMI 31.40 kg/m2  SpO2 98%  LMP 06/03/2014 (Approximate)  Intake/Output Summary (Last 24 hours) at 07/08/14 1216 Last data filed at 07/08/14 0900  Gross per 24 hour  Intake    600 ml  Output      0 ml  Net    600 ml   Filed Weights   07/03/14 0121  Weight: 83.008 kg (183 lb)    Exam:   General:  alert, NAD  Cardiovascular: RRR  Respiratory: CTABL  Abdomen: soft/NT/ND, positive bowel sounds  Musculoskeletal: generalized tenderness, right cervical muscle edema/tenderness, left arm edema, sensation/pulse intact, does has weakness left hand.  Skin: vesicular skin eruptions right lateral neck, left face, from abrasion? Dark spot on buttock disappeared, skin lesion healing, no blistering observed since 3/20.  Data Reviewed: Basic Metabolic Panel:  Recent Labs Lab 07/03/14 0752 07/04/14 0530 07/05/14 0552 07/06/14 0652 07/07/14 0730 07/08/14 0536  NA 134* 134* 132* 134* 135 134*  K 2.6* 3.8 3.3* 3.3* 3.4* 3.4*  CL 96 90* 84* 81* 90* 89*  CO2 28 35* 36* 44* 37* 36*  GLUCOSE 133* 90 111* 90 89 138*  BUN 38* 37* 33* 32* 30* 28*  CREATININE 8.87* 8.44* 7.43* 6.54* 5.62* 5.18*  CALCIUM 7.9* 7.9* 8.2* 8.0* 7.7* 7.6*  MG 2.1  --   --   --   --   --   PHOS  --  4.5 4.2 4.8* 4.7*  --    Liver Function Tests:  Recent Labs Lab 07/04/14 0530 07/05/14 0552 07/06/14 0652 07/07/14 0730  AST 205* 168*  --  103*  ALT 109* 101*  --  68*  ALKPHOS 39 43  --  38*  BILITOT 0.5 0.5  --  0.6  PROT 5.9* 6.5  --  6.0  ALBUMIN 2.8* 2.9* 2.7* 2.6*  2.7*   No results for input(s): LIPASE, AMYLASE in the last 168 hours. No results for input(s): AMMONIA in the  last 168 hours. CBC:  Recent Labs Lab 07/02/14 1758 07/03/14 0752 07/04/14 0530 07/05/14 0552 07/06/14 0652 07/07/14 0730  WBC 5.8 4.6 4.9 6.1 6.3 5.4  NEUTROABS 4.3  --   --   --  4.3  --   HGB 11.4* 10.2* 10.3* 11.1* 10.8* 9.9*  HCT 34.0* 29.7* 30.2*  31.8* 33.3* 32.5* 29.8*  MCV 92.1 89.7 89.9 92.0 93.4 94.0  PLT 367 291 273 318 286 259   Cardiac Enzymes:    Recent Labs Lab 07/03/14 0018 07/04/14 0530 07/06/14 0652 07/07/14 0730 07/08/14 0536  CKTOTAL 23762* 11935* 6227* 4637* 3999*   BNP (last 3 results) No results for input(s): BNP in the last 8760 hours.  ProBNP (last 3 results) No results for input(s): PROBNP in  the last 8760 hours.  CBG: No results for input(s): GLUCAP in the last 168 hours.  Recent Results (from the past 240 hour(s))  Wet prep, genital     Status: Abnormal   Collection Time: 07/02/14  7:00 PM  Result Value Ref Range Status   Yeast Wet Prep HPF POC NONE SEEN NONE SEEN Final   Trich, Wet Prep NONE SEEN NONE SEEN Final   Clue Cells Wet Prep HPF POC FEW (A) NONE SEEN Final   WBC, Wet Prep HPF POC FEW (A) NONE SEEN Final     Studies: No results found.  Scheduled Meds: . ARIPiprazole  2 mg Oral Daily  . buPROPion  150 mg Oral BID  . ferrous sulfate  325 mg Oral Daily  . gabapentin  300 mg Oral BID  . levothyroxine  137 mcg Oral QAC breakfast  . metoprolol succinate  50 mg Oral Daily  . metroNIDAZOLE  500 mg Oral Q12H  . nortriptyline  25 mg Oral QHS  . pantoprazole  40 mg Oral QHS    Continuous Infusions:   Trenese Haft  [facility] Pager [phone]. If 7PM-7AM, please contact night-coverage at [email or web address], password [facility] 07/08/2014, 12:16 PM  LOS: 6 days

## 2014-07-08 NOTE — Evaluation (Signed)
Occupational Therapy Evaluation Patient Details Name: Jill Hopkins MRN: [id number] DOB: [birth year] Today's Date: 07/08/2014    History of Present Illness Pt is a 44 yo female admitted with CRI, possible rhabdomyelosis from edema in erector spinae ms from recent fall.  Pt fell at night and came to next morning on bathroom floor.  Did  not come to hospital until 4 days later.  Pt has weakness in L UE and significant swelling in LUE as well as neck pain.     Clinical Impression   Pt admitted with the above diagnosis and has the deficits listed below. Pt would benefit from cont OT to increase I with basic adls and increase functional use of LUE during adls so she can d/c home safely with her parents.     Follow Up Recommendations  Outpatient OT;Supervision/Assistance - 24 hour    Equipment Recommendations  None recommended by OT    Recommendations for Other Services       Precautions / Restrictions Precautions Precautions: Fall Restrictions Weight Bearing Restrictions: No      Mobility Bed Mobility Overal bed mobility: Modified Independent             General bed mobility comments: extra time given and pt uses bedrails to assist at times.  Transfers Overall transfer level: Needs assistance Equipment used: 1 person hand held assist Transfers: Sit to/from Stand Sit to Stand: Supervision         General transfer comment: Pt requires increased assist with mobility but is able to stand w/o physical assist.    Balance Overall balance assessment: Needs assistance Sitting-balance support: Feet supported Sitting balance-Leahy Scale: Good     Standing balance support: Bilateral upper extremity supported;During functional activity Standing balance-Leahy Scale: Fair Standing balance comment: Pt very unsteady on her feet.  Can stand w/o external support but was not steady when challenges were presented.                            ADL Overall ADL's : Needs  assistance/impaired Eating/Feeding: Set up;Sitting Eating/Feeding Details (indicate cue type and reason): some assist with set up.  Difficult to cut with L arm due to decreased fine motor skills. Grooming: Oral care;Wash/dry face;Wash/dry hands;Supervision/safety;Standing Grooming Details (indicate cue type and reason): Pt could complete all tasks but had decreased coordination doing so. Upper Body Bathing: Set up;Sitting   Lower Body Bathing: Min guard;Sit to/from stand Lower Body Bathing Details (indicate cue type and reason): decreased standing balance. Upper Body Dressing : Set up;Standing Upper Body Dressing Details (indicate cue type and reason): difficulty with buttons/fasteners Lower Body Dressing: Min guard;Sit to/from stand Lower Body Dressing Details (indicate cue type and reason): extra time for fasteners.  Min guard when standing due to decreased balance. Toilet Transfer: Min guard;Ambulation   Toileting- Clothing Manipulation and Hygiene: Sit to/from stand;Min guard Toileting - Clothing Manipulation Details (indicate cue type and reason): min guard for safety reasons in standing.     Functional mobility during ADLs: Minimal assistance General ADL Comments: Pt is able to complete most adls with very little assist.  Assist is needed b/c pt is unsteady on her feet and at times sturggles when using the LUE during functional tasks.     Vision Vision Assessment?: No apparent visual deficits   Perception Perception Perception Tested?: No   Praxis Praxis Praxis tested?: Within functional limits    Pertinent Vitals/Pain Pain Assessment: 0-10 Pain Score: 8  Pain Location: "  everywhere"  neck/head Pain Descriptors / Indicators: Aching;Burning;Throbbing;Tightness Pain Intervention(s): Monitored during session;Limited activity within patient's tolerance;Repositioned     Hand Dominance Right   Extremity/Trunk Assessment Upper Extremity Assessment Upper Extremity Assessment:  LUE deficits/detail LUE Deficits / Details: Pt with significant edema in LUE.  ROM WFL.   Strength WFL (5-/5).  Pt with decreased coordination. LUE Coordination: decreased fine motor;decreased gross motor   Lower Extremity Assessment Lower Extremity Assessment: Defer to PT evaluation   Cervical / Trunk Assessment Cervical / Trunk Assessment: Other exceptions (pt w difficulty rotating head to R on command.) Cervical / Trunk Exceptions: Pt could rotate head to R when in her own environment and not being asked to do so.   Communication Communication Communication: No difficulties   Cognition Arousal/Alertness: Awake/alert Behavior During Therapy: WFL for tasks assessed/performed Overall Cognitive Status: Within Functional Limits for tasks assessed                     General Comments       Exercises Exercises: Hand exercises     Shoulder Instructions      Home Living Family/patient expects to be discharged to:: Private residence Living Arrangements: Children;Parent Available Help at Discharge: Family;Available 24 hours/day;Other (Comment) (per pt mother is available.) Type of Home: House Home Access: Stairs to enter [employer] of Steps: 5 Entrance Stairs-Rails: Right Home Layout: One level     Bathroom Shower/Tub: Tub/shower unit;Curtain Shower/tub characteristics: Architectural technologist: Standard     Home Equipment: None          Prior Functioning/Environment Level of Independence: Independent        Comments: pt not working    OT Diagnosis: Generalized weakness;Hemiplegia non-dominant side   OT Problem List: Decreased strength;Impaired balance (sitting and/or standing);Decreased coordination;Decreased knowledge of use of DME or AE;Impaired UE functional use;Pain;Obesity;Increased edema   OT Treatment/Interventions: Self-care/ADL training;Therapeutic exercise;DME and/or AE instruction;Therapeutic activities    OT Goals(Current goals can  be found in the care plan section) Acute Rehab OT Goals Patient Stated Goal: to be well again. OT Goal Formulation: With patient Time For Goal Achievement: 07/15/14 Potential to Achieve Goals: Good ADL Goals Pt/caregiver will Perform Home Exercise Program: Increased strength;Left upper extremity;With Supervision Additional ADL Goal #1: Pt will bathe in shower with S. Additional ADL Goal #2: Pt will ambulate to bathroom and toilet with S. Additional ADL Goal #3: Pt will use LUE for ADLS w/o cues.  OT Frequency: Min 3X/week   Barriers to D/C:    Pt states she has 24/7 S?       Co-evaluation              End of Session Nurse Communication: Mobility status;Other (comment) (pt unsteady to walk to bathroom alone)  Activity Tolerance: Patient tolerated treatment well Patient left: in bed;with call bell/phone within reach;with family/visitor present   Time: 9021-1155 OT Time Calculation (min): 29 min Charges:  OT General Charges $OT Visit: 1 Procedure OT Evaluation $Initial OT Evaluation Tier I: 1 Procedure OT Treatments $Self Care/Home Management : 8-22 mins G-Codes:    Glenford Peers 2014/07/23, 10:04 AM  [phone]

## 2014-07-08 NOTE — Evaluation (Signed)
Physical Therapy Evaluation Patient Details Name: Jill Hopkins MRN: [id number] DOB: 23-Oct-1970 Today's Date: 07/08/2014   History of Present Illness  Pt is a 44 yo female admitted with CRI, possible rhabdomyelosis from edema in erector spinae ms from recent fall.  Pt fell at night and came to next morning on bathroom floor.  Did  not come to hospital until 4 days later.  Pt has weakness in L UE and significant swelling in LUE as well as neck pain.    Clinical Impression  Pt presenting with impaired balance requiring physical assist to prevent falling. Pt unsafe to amb indep. Even with RW. Pt with swelling in L UE and cervical. Pt responded well to cervical therapeutic massage and reported less stiffness. Pt only safe to d/c home if 24/7 assist is provided. HHPT recommended to address balance deficits.    Follow Up Recommendations Home health PT;Supervision/Assistance - 24 hour    Equipment Recommendations  Rolling walker with 5" wheels    Recommendations for Other Services       Precautions / Restrictions Precautions Precautions: Fall Restrictions Weight Bearing Restrictions: No      Mobility  Bed Mobility Overal bed mobility: Modified Independent                Transfers Overall transfer level: Needs assistance Equipment used: 1 person hand held assist Transfers: Sit to/from Stand Sit to Stand: Min guard         General transfer comment: pt discoordinated technique and definite use of hands  Ambulation/Gait Ambulation/Gait assistance: Min assist Ambulation Distance (Feet): 150 Feet Assistive device: None;Rolling walker (2 wheeled) Gait Pattern/deviations: Step-through pattern;Antalgic;Staggering left;Staggering right;Scissoring Gait velocity: decreased   General Gait Details: pt with staggering cross over gait requiring minA to maintain balance. introduced walker however pt remains unsteady as she staggers L/R. pt also reports of double vision but improves  with covering of unilateral eye  Stairs            Wheelchair Mobility    Modified Rankin (Stroke Patients Only)       Balance Overall balance assessment: Needs assistance         Standing balance support: Bilateral upper extremity supported Standing balance-Leahy Scale: Poor Standing balance comment: pt very unsteady requiring minimally unilateral UE support                             Pertinent Vitals/Pain Pain Assessment: 0-10 Pain Score: 8  Pain Location: neck and L UE Pain Descriptors / Indicators:  (tight) Pain Intervention(s): Monitored during session    Home Living Family/patient expects to be discharged to:: Private residence Living Arrangements: Children;Parent Available Help at Discharge: Family;Available 24 hours/day;Other (Comment) Type of Home: House Home Access: Stairs to enter Entrance Stairs-Rails: Right Entrance Stairs-Number of Steps: 5 Home Layout: One level Home Equipment: None      Prior Function Level of Independence: Independent         Comments: pt not working     Hand Dominance   Dominant Hand: Right    Extremity/Trunk Assessment   Upper Extremity Assessment: LUE deficits/detail       LUE Deficits / Details: Pt with significant edema in LUE.  ROM WFL.   Strength WFL (5-/5).  Pt with decreased coordination.   Lower Extremity Assessment: Generalized weakness      Cervical / Trunk Assessment:  (swollen, limited ROM, improved after therapeutic massage)  Communication   Communication: No difficulties  Cognition Arousal/Alertness: Awake/alert Behavior During Therapy: WFL for tasks assessed/performed Overall Cognitive Status: Within Functional Limits for tasks assessed                      General Comments General comments (skin integrity, edema, etc.): provided therapeutic massage to neck to address swelling and improve ROM    Exercises        Assessment/Plan    PT Assessment Patient  needs continued PT services  PT Diagnosis Difficulty walking;Acute pain   PT Problem List Decreased strength;Decreased range of motion;Decreased activity tolerance;Decreased balance;Decreased mobility;Decreased coordination  PT Treatment Interventions DME instruction;Gait training;Stair training;Functional mobility training;Therapeutic activities;Therapeutic exercise;Balance training   PT Goals (Current goals can be found in the Care Plan section) Acute Rehab PT Goals Patient Stated Goal: stop the pain PT Goal Formulation: With patient Time For Goal Achievement: 07/15/14 Potential to Achieve Goals: Good    Frequency Min 3X/week   Barriers to discharge        Co-evaluation               End of Session Equipment Utilized During Treatment: Gait belt Activity Tolerance: Patient tolerated treatment well Patient left: in bed;with bed alarm set;with call bell/phone within reach Nurse Communication: Mobility status         Time: 1347-1411 PT Time Calculation (min) (ACUTE ONLY): 24 min   Charges:   PT Evaluation $Initial PT Evaluation Tier I: 1 Procedure PT Treatments $Gait Training: 8-22 mins   PT G CodesKingsley Callander 07/08/2014, 3:11 PM   Kittie Plater, PT, DPT Pager #: [phone] Office #: [phone]

## 2014-07-09 ENCOUNTER — Inpatient Hospital Stay (HOSPITAL_COMMUNITY): Payer: Medicaid Other

## 2014-07-09 LAB — CBC
HCT: 26.1 % — ABNORMAL LOW (ref 36.0–46.0)
Hemoglobin: 8.6 g/dL — ABNORMAL LOW (ref 12.0–15.0)
MCH: 30.7 pg (ref 26.0–34.0)
MCHC: 33 g/dL (ref 30.0–36.0)
MCV: 93.2 fL (ref 78.0–100.0)
PLATELETS: 251 10*3/uL (ref 150–400)
RBC: 2.8 MIL/uL — AB (ref 3.87–5.11)
RDW: 13.6 % (ref 11.5–15.5)
WBC: 4.6 10*3/uL (ref 4.0–10.5)

## 2014-07-09 LAB — BASIC METABOLIC PANEL
Anion gap: 11 (ref 5–15)
BUN: 25 mg/dL — ABNORMAL HIGH (ref 6–23)
CALCIUM: 8 mg/dL — AB (ref 8.4–10.5)
CO2: 28 mmol/L (ref 19–32)
CREATININE: 4.3 mg/dL — AB (ref 0.50–1.10)
Chloride: 96 mmol/L (ref 96–112)
GFR calc Af Amer: 14 mL/min — ABNORMAL LOW (ref >90)
GFR calc non Af Amer: 12 mL/min — ABNORMAL LOW (ref >90)
Glucose, Bld: 111 mg/dL — ABNORMAL HIGH (ref 70–99)
Potassium: 3.1 mmol/L — ABNORMAL LOW (ref 3.5–5.1)
Sodium: 135 mmol/L (ref 135–145)

## 2014-07-09 LAB — SEDIMENTATION RATE: Sed Rate: 30 mm/hr — ABNORMAL HIGH (ref 0–22)

## 2014-07-09 MED ORDER — POTASSIUM CHLORIDE CRYS ER 20 MEQ PO TBCR
40.0000 meq | EXTENDED_RELEASE_TABLET | Freq: Once | ORAL | Status: AC
  Administered 2014-07-09: 40 meq via ORAL
  Filled 2014-07-09: qty 2

## 2014-07-09 MED ORDER — LEVOTHYROXINE SODIUM 150 MCG PO TABS
150.0000 ug | ORAL_TABLET | Freq: Every day | ORAL | Status: DC
  Administered 2014-07-10 – 2014-07-13 (×4): 150 ug via ORAL
  Filled 2014-07-09 (×5): qty 1

## 2014-07-09 MED ORDER — HEPARIN SODIUM (PORCINE) 5000 UNIT/ML IJ SOLN
5000.0000 [IU] | Freq: Three times a day (TID) | INTRAMUSCULAR | Status: DC
  Administered 2014-07-10 – 2014-07-13 (×10): 5000 [IU] via SUBCUTANEOUS
  Filled 2014-07-09 (×9): qty 1

## 2014-07-09 NOTE — Progress Notes (Signed)
Occupational Therapy Treatment Patient Details Name: Jill Hopkins MRN: [id number] DOB: [birth year] Today's Date: 07/09/2014    History of present illness Pt is a 44 yo female admitted with CRI, possible rhabdomyelosis from edema in erector spinae ms from recent fall.  Pt fell at night and came to next morning on bathroom floor.  Did  not come to hospital until 4 days later.  Pt has weakness in L UE and significant swelling in LUE as well as neck pain.     OT comments  Pt still with increased swelling and pain her neck, left forearm, and hand.  Decreased digit extension at the MPs of the left hand with approximately 75 % of full AROM noted.  Grip strength 4/5, wrist flexion and extension 4/5, as well as elbow flexion/extension 4/5.  Pt with greater AROM noted with cervical rotation to the left and moderate limitations with cervical extension.  Will continue to monitor for AROM and strengthening.  Pt may need hand splint if digit extension worsens.    Follow Up Recommendations  Outpatient OT    Equipment Recommendations  None recommended by OT       Precautions / Restrictions Precautions Precautions: Fall Restrictions Weight Bearing Restrictions: No                              Cognition   Behavior During Therapy: WFL for tasks assessed/performed Overall Cognitive Status: Within Functional Limits for tasks assessed                                    Pertinent Vitals/ Pain       Pain Assessment: Faces Faces Pain Scale: Hurts little more Pain Location: neck and left forearm Pain Intervention(s): Monitored during session;Repositioned         Frequency Min 3X/week        Plan Discharge plan remains appropriate          Activity Tolerance Patient tolerated treatment well   Patient Left in bed   Nurse Communication Mobility status        Time: 1005-1035 OT Time Calculation (min): 30 min  Charges: OT General Charges $OT Visit: 1 Procedure OT  Treatments $Therapeutic Exercise: 23-37 mins  Keymora Grillot OTR/L 07/09/2014, 11:08 AM

## 2014-07-09 NOTE — Progress Notes (Signed)
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  Pt fell at night and came to next morning on bathroom floor.  Did  not come to hospital until 4 days later.  Pt has weakness in L UE and significant swelling in LUE as well as neck pain.      PT Comments    Pt staggering L and R during ambulation and pt reports she is unaware of her impaired coordination and balance.  Pt will need supervision 24/7 upon returning home to ensure safety w/ ambulation.  Pt reports she has been getting up and using the restroom w/o RN assistance, PT emphasized importance for pt to notify RN when she would like to get OOB, pt verbalized understanding.   Follow Up Recommendations  Home health PT;Supervision/Assistance - 24 hour     Equipment Recommendations  Rolling walker with 5" wheels    Recommendations for Other Services       Precautions / Restrictions Precautions Precautions: Fall Precaution Comments: Pt is unaware of her imbalance while ambulating Restrictions Weight Bearing Restrictions: No    Mobility  Bed Mobility Overal bed mobility: Modified Independent             General bed mobility comments: increased time w/ slight use of bed rails  Transfers Overall transfer level: Needs assistance Equipment used: Rolling walker (2 wheeled) Transfers: Sit to/from Stand Sit to Stand: Min guard         General transfer comment: pt reports slight dizziness w/ standing (says is her baseline) which went away after 10 seconds.  Ambulation/Gait Ambulation/Gait assistance: Min guard Ambulation Distance (Feet): 250 Feet Assistive device: Rolling walker (2 wheeled) Gait Pattern/deviations: Step-through pattern;Scissoring;Staggering left;Staggering right     General Gait Details:  Pt reports double vision which she thinks is 2/2 to wearing her glasses.  Staggering to L and R but able to maintain balance by using the RW.   Stairs            Wheelchair Mobility    Modified Rankin (Stroke Patients Only)       Balance Overall balance assessment: Needs assistance Sitting-balance support: No upper extremity supported;Feet supported Sitting balance-Leahy Scale: Good     Standing balance support: Bilateral upper extremity supported Standing balance-Leahy Scale: Poor Standing balance comment: Pt unsteady specifically when taking her turns during ambulation and pt did not recognize she was off balance.                    Cognition Arousal/Alertness: Awake/alert Behavior During Therapy: WFL for tasks assessed/performed Overall Cognitive Status: Within Functional Limits for tasks assessed                      Exercises      General Comments        Pertinent Vitals/Pain Pain Assessment: Faces Pain Score: 3  Faces Pain Scale: Hurts a little bit Pain Location: neck and L forearm Pain Descriptors / Indicators: Aching Pain Intervention(s): Limited activity within patient's tolerance;Monitored during session;Repositioned    Home Living                      Prior Function            PT Goals (current goals can now be  found in the care plan section) Acute Rehab PT Goals Patient Stated Goal: to go home Progress towards PT goals: Progressing toward goals    Frequency  Min 3X/week    PT Plan Current plan remains appropriate    Co-evaluation             End of Session Equipment Utilized During Treatment: Gait belt Activity Tolerance: Patient tolerated treatment well Patient left: in bed;with call bell/phone within reach     Time: 1351-1408 PT Time Calculation (min) (ACUTE ONLY): 17 min  Charges:  $Gait Training: 8-22 mins                    G CodesJoslyn Hy PT, [place] [phone] 07/09/2014, 2:17  PM

## 2014-07-09 NOTE — Progress Notes (Signed)
PATIENT DETAILS Name: Jill Hopkins Age: 44 y.o. Sex: female Date of Birth: 1970-06-08 Admit Date: 07/02/2014 Admitting Physician Theressa Millard, MD OZH:[id number], MD  Subjective: Complains of significant right neck pain. Claims that she has significant swelling in her neck area still.  Assessment/Plan: Principal Problem:   Acute renal failure: Secondary to rhabdomyolysis. Admitted and started on IV fluids and nephrology consulted. Creatinine now slowly down trending. Follow electrolytes, low creatinine to come down slowly. IV fluids have been discontinued on 3/21 because of generalized swelling.  Active Problems:   Rhabdomyolysis:Likely secondary to muscle injury from a fall/being down in the bathroom for at least 12 hours a few days prior to this admission. She was hydrated, CK has now slowly started to trend down. Will repeat labs in a.m. including CK    Swelling of neck muscles:? Secondary to compression/injury when she was on the floor a few days prior to admission. Repeat MRI on 3/23 essentially unchanged and continues swelling of her muscles without any evidence of abscess or hematoma. Have spoken with ENT-Dr. Wilburn Cornelia, he did not have any further recommendations apart from talking with the neurosurgeon. Have placed call to neurosurgery. Her vascular structures in the MRI appeared normal, recent bilateral upper extremity Doppler was negative for DVT. Check ESR/CRP, ? perhaps start a trial of steroids received this improves.    Syncope: This occurred a few days prior to admission. MRI of the brain was negative. 2-D echocardiogram negative as well. Telemetry was negative. A urine drug screen was positive for cocaine, a serum drug screen panel positive for multiple agents-please see report. Following closely monitor for now.    Hypertension: Controlled. Continue with metoprolol.    Hypothyroidism: Continue with levothyroxine-but since TSH elevated-increase to 150 g.  Repeat TSH in 3 months    Bacterial vaginosis: Continue Flagyl currently on day 4 of 7 treatment    Left forearm/arm swelling: Doppler negative. Likely secondary to edema/compression from fall. Following monitor closely.    Elevated liver enzymes: Suspect secondary to rhabdomyolysis. Follow LFTs periodically.    Hyponatremia: Resolved    Hypokalemia: Recheck and replete    Suspected drug abuse: Urine drug screen positive for cocaine and opiates, serum drug screen also positive. Patient vehemently denies. However polysubstance use-could explain the syncopal episode causing her to pass out for almost 12 hours.      History of depression: Continue with nortriptyline, Abilify.  Disposition: Remain inpatient  Antibiotics:  See below   Anti-infectives    Start     Dose/Rate Route Frequency Ordered Stop   07/04/14 2200  metroNIDAZOLE (FLAGYL) tablet 500 mg     500 mg Oral Every 12 hours 07/04/14 1714        DVT Prophylaxis: Start Prophylactic Heparin   Code Status: Full code   Family Communication Boyfriend at bedside-this M.D. asked boyfriend to leave the room at patient's request.  Procedures:  None  CONSULTS:  nephrology   UXL:[id number] over the phone  Time spent 40 minutes-which includes 50% of the time with face-to-face with patient/ family and coordinating care related to the above assessment and plan.  MEDICATIONS: Scheduled Meds: . ARIPiprazole  2 mg Oral Daily  . buPROPion  150 mg Oral BID  . ferrous sulfate  325 mg Oral Daily  . gabapentin  300 mg Oral BID  . levothyroxine  137 mcg Oral QAC breakfast  . metoprolol succinate  50 mg Oral Daily  . metroNIDAZOLE  500 mg  Oral Q12H  . nortriptyline  25 mg Oral QHS  . pantoprazole  40 mg Oral QHS  . potassium chloride  40 mEq Oral Once   Continuous Infusions:  PRN Meds:.acetaminophen **OR** acetaminophen, cyclobenzaprine, HYDROmorphone (DILAUDID) injection, ondansetron **OR** ondansetron (ZOFRAN) IV,  zolpidem    PHYSICAL EXAM: Vital signs in last 24 hours: Filed Vitals:   07/08/14 1244 07/08/14 2300 07/09/14 0521 07/09/14 1251  BP: 121/72 118/77 115/68 139/92  Pulse: 76 70 64 71  Temp: 99.5 F (37.5 C) 98.3 F (36.8 C) 98.7 F (37.1 C) 98.4 F (36.9 C)  TempSrc:  Oral Oral   Resp: 18 17 18 20   Height:      Weight:      SpO2:  100% 99%     Weight change:  Filed Weights   07/03/14 0121  Weight: 83.008 kg (183 lb)   Body mass index is 31.4 kg/(m^2).   Gen Exam: Awake and alert with clear speech. Lower right facial swelling in the right mandibular area. Neck: Mostly right posterior neck swelling evident. Neck is supple. Patient able to move left more than right laterally.  Chest: B/L Clear.   CVS: S1 S2 Regular, no murmurs.  Abdomen: soft, BS +, non tender, non distended.  Lower Extremities: no edema, lower extremities warm to touch. Neurologic: Non Focal.   Skin: No Rash.   Wounds: N/A.  Intake/Output from previous day:  Intake/Output Summary (Last 24 hours) at 07/09/14 1710 Last data filed at 07/09/14 0900  Gross per 24 hour  Intake    360 ml  Output      0 ml  Net    360 ml     LAB RESULTS: CBC  Recent Labs Lab 07/02/14 1758  07/04/14 0530 07/05/14 0552 07/06/14 0652 07/07/14 0730 07/09/14 0739  WBC 5.8  < > 4.9 6.1 6.3 5.4 4.6  HGB 11.4*  < > 10.3* 11.1* 10.8* 9.9* 8.6*  HCT 34.0*  < > 30.2*  31.8* 33.3* 32.5* 29.8* 26.1*  PLT 367  < > 273 318 286 259 251  MCV 92.1  < > 89.9 92.0 93.4 94.0 93.2  MCH 30.9  < > 30.7 30.7 31.0 31.2 30.7  MCHC 33.5  < > 34.1 33.3 33.2 33.2 33.0  RDW 13.9  < > 13.4 13.7 13.6 13.7 13.6  LYMPHSABS 1.0  --   --   --  1.1  --   --   MONOABS 0.3  --   --   --  0.5  --   --   EOSABS 0.1  --   --   --  0.4  --   --   BASOSABS 0.0  --   --   --  0.0  --   --   < > = values in this interval not displayed.  Chemistries   Recent Labs Lab 07/03/14 0752  07/05/14 0552 07/06/14 0652 07/07/14 0730 07/08/14 0536  07/09/14 0739  NA 134*  < > 132* 134* 135 134* 135  K 2.6*  < > 3.3* 3.3* 3.4* 3.4* 3.1*  CL 96  < > 84* 81* 90* 89* 96  CO2 28  < > 36* 44* 37* 36* 28  GLUCOSE 133*  < > 111* 90 89 138* 111*  BUN 38*  < > 33* 32* 30* 28* 25*  CREATININE 8.87*  < > 7.43* 6.54* 5.62* 5.18* 4.30*  CALCIUM 7.9*  < > 8.2* 8.0* 7.7* 7.6* 8.0*  MG 2.1  --   --   --   --   --   --   < > =  values in this interval not displayed.  CBG: No results for input(s): GLUCAP in the last 168 hours.  GFR Estimated Creatinine Clearance: 17.9 mL/min (by C-G formula based on Cr of 4.3).  Coagulation profile No results for input(s): INR, PROTIME in the last 168 hours.  Cardiac Enzymes No results for input(s): CKMB, TROPONINI, MYOGLOBIN in the last 168 hours.  Invalid input(s): CK  Invalid input(s): POCBNP No results for input(s): DDIMER in the last 72 hours. No results for input(s): HGBA1C in the last 72 hours. No results for input(s): CHOL, HDL, LDLCALC, TRIG, CHOLHDL, LDLDIRECT in the last 72 hours. No results for input(s): TSH, T4TOTAL, T3FREE, THYROIDAB in the last 72 hours.  Invalid input(s): FREET3 No results for input(s): VITAMINB12, FOLATE, FERRITIN, TIBC, IRON, RETICCTPCT in the last 72 hours. No results for input(s): LIPASE, AMYLASE in the last 72 hours.  Urine Studies No results for input(s): UHGB, CRYS in the last 72 hours.  Invalid input(s): UACOL, UAPR, USPG, UPH, UTP, UGL, UKET, UBIL, UNIT, UROB, ULEU, UEPI, UWBC, URBC, UBAC, CAST, UCOM, BILUA  MICROBIOLOGY: Recent Results (from the past 240 hour(s))  Wet prep, genital     Status: Abnormal   Collection Time: 07/02/14  7:00 PM  Result Value Ref Range Status   Yeast Wet Prep HPF POC NONE SEEN NONE SEEN Final   Trich, Wet Prep NONE SEEN NONE SEEN Final   Clue Cells Wet Prep HPF POC FEW (A) NONE SEEN Final   WBC, Wet Prep HPF POC FEW (A) NONE SEEN Final    RADIOLOGY STUDIES/RESULTS: Ct Head Wo Contrast  07/06/2014   CLINICAL DATA:  H/o  depression, renal disorder, hypertension, hypothyroid, 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 She reports that she fell in the bathroom and woke up on the floor the next morning. She does not remember why she fell, she denies drinking alcohol or using drugs. She reports having lesions to the side of her neck and face after the fall. She complains of pain over her right neck. She reports feeling like her hand cannot straighten out. She denies recent illness. She does not take any medications currently for herpes. She does report some vaginal discharge for 3 weeks. The patient denies fevers, cough, wheezing, shortness of breath, vaginal lesions, vaginal leading, dysuria, hematuria, urinary frequency, urinary urgency, or difficulty walking  EXAM: CT HEAD WITHOUT CONTRAST  CT CERVICAL SPINE WITHOUT CONTRAST  TECHNIQUE: Multidetector CT imaging of the head and cervical spine was performed following the standard protocol without intravenous contrast. Multiplanar CT image reconstructions of the cervical spine were also generated.  COMPARISON:  06/03/2011  FINDINGS: CT HEAD FINDINGS  Ventricles are normal in size and configuration. No parenchymal masses or mass effect. No evidence of an infarct. There are no extra-axial masses or abnormal fluid collections.  No intracranial hemorrhage.  No skull fracture. Visualized sinuses and mastoid air cells are clear.  There is low attenuation within the right superior posterior paraspinal musculature with some overlying subcutaneous edema.  CT CERVICAL SPINE FINDINGS  There is low-attenuation mild thickening of the right posterior paraspinal muscles as well as a right medial trapezius. This may be due to muscle strain. Subcutaneous edema is noted along the sub posterior neck soft tissues, greater on the right as well as the  right side of the face and  neck extending to the upper chest, again greater on the right.  No soft tissue mass, hematoma or adenopathy.  There is no evidence of fracture. No spondylolisthesis. There is moderate loss disc height at C5-C6. Endplate spurring is noted at this level. No evidence of a disc herniation. Neural foramina are well preserved.  IMPRESSION: 1. No intracranial abnormality.  No skull fracture. 2. Abnormal low attenuation throughout the right posterior paraspinal musculature. There is also subcutaneous edema which is most evident along the right posterior lateral neck right face extending to the right upper chest. The low-attenuation in the right posterior paraspinal musculature is likely muscle edema from a strain, but is nonspecific. 3. No cervical fracture or spondylolisthesis.   Electronically Signed   By: Lajean Manes M.D.   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 She reports that she fell in the bathroom and woke up on the floor the next morning. She does not remember why she fell, she denies drinking alcohol or using drugs. She reports having lesions to the side of her neck and face after the fall. She complains of pain over her right neck. She reports feeling like her hand cannot straighten out. She denies recent illness. She does not take any medications currently for herpes. She does report some vaginal discharge for 3 weeks. The patient denies fevers, cough, wheezing, shortness of breath, vaginal lesions, vaginal leading, dysuria, hematuria, urinary frequency, urinary urgency, or difficulty walking  EXAM: CT HEAD WITHOUT CONTRAST  CT CERVICAL SPINE WITHOUT CONTRAST  TECHNIQUE:  Multidetector CT imaging of the head and cervical spine was performed following the standard protocol without intravenous contrast. Multiplanar CT image reconstructions of the cervical spine were also generated.  COMPARISON:  06/03/2011  FINDINGS: CT HEAD FINDINGS  Ventricles are normal in size and configuration. No parenchymal masses or mass effect. No evidence of an infarct. There are no extra-axial masses or abnormal fluid collections.  No intracranial hemorrhage.  No skull fracture. Visualized sinuses and mastoid air cells are clear.  There is low attenuation within the right superior posterior paraspinal musculature with some overlying subcutaneous edema.  CT CERVICAL SPINE FINDINGS  There is low-attenuation mild thickening of the right posterior paraspinal muscles as well as a right medial trapezius. This may be due to muscle strain. Subcutaneous edema is noted along the sub posterior neck soft tissues, greater on the right as well as the right side of the face and neck extending to the upper chest, again greater on the right.  No soft tissue mass, hematoma or adenopathy.  There is no evidence of fracture. No spondylolisthesis. There is moderate loss disc height at C5-C6. Endplate spurring is noted at this level. No evidence of a disc herniation. Neural foramina are well preserved.  IMPRESSION: 1. No intracranial abnormality.  No skull fracture. 2. Abnormal low attenuation throughout the right posterior paraspinal musculature. There is also subcutaneous edema which is most evident along the right posterior lateral neck right face extending to the right upper chest. The low-attenuation in the right posterior paraspinal musculature is likely muscle edema from a strain, but is nonspecific. 3. No cervical fracture or spondylolisthesis.   Electronically Signed   By: Shanon Brow  Ormond M.D.   On: 07/06/2014 15:14   Mr Brain Wo Contrast  07/02/2014   CLINICAL DATA:  [place] 4 days ago and unconscious for 12 hours. Now  with neck pain. Left radial nerve palsy  EXAM: MRI HEAD WITHOUT CONTRAST  TECHNIQUE: Multiplanar, multiecho pulse sequences of the brain and surrounding structures were obtained without intravenous contrast.  COMPARISON:  CT head 06/03/2011  FINDINGS: Cerebellar tonsils extend 8 mm below the foramen magnum compatible with mild Chiari malformation. Negative for hydrocephalus. Pituitary normal in size.  Negative for acute infarct.  Negative for chronic ischemia.  Negative for demyelinating disease. Cerebral white matter normal. Brainstem and basal ganglia normal  Negative for intracranial hemorrhage or fluid collection.  Negative for mass or edema.  No shift of the midline structures.  Mild mucosal edema paranasal sinuses  There is extensive edema in the erector spinae muscles of the right posterior neck below the occiput. This is unilateral and involves all of the muscles. This may reach related to acute muscle injury from the patient's fall or subsequent positioning while unconscious. This may lead to rhabdomyolysis.  IMPRESSION: Mild Chiari malformation.  No acute intracranial abnormality.  Extensive edema in the erector spinae muscles in the right neck, worrisome for acute muscle injury. This may lead to rhabdomyolysis.   Electronically Signed   By: Franchot Gallo M.D.   On: 07/02/2014 20:58   Mr Cervical Spine Wo Contrast  07/09/2014   CLINICAL DATA:  44 year old female with cervical muscle pain. Rhabdomyolysis suspected following loss of consciousness for 12 hours. Subsequent encounter.  EXAM: MRI CERVICAL SPINE WITHOUT CONTRAST  TECHNIQUE: Multiplanar, multisequence MR imaging of the cervical spine was performed. No intravenous contrast was administered.  COMPARISON:  Cervical spine MRI 07/02/2014.  FINDINGS: Continued diffuse abnormal superficial right neck and deep paraspinal muscle signal and enlargement, best seen today on coronal STIR image 5 of series 600.  Very mild or minimal abnormal signal in the  left erector spinae muscles (image 5), but this has progressed since the prior study (series 500 image 10).  Increased prevertebral or retropharyngeal effusion extending from the C2 level to the C6 level. Also in this region, there is asymmetric signal and enlargement throughout the left longus coli muscle (series 8, image 18) although the right remains normal. This is similar to the prior study.  Cervical spine bone marrow signal remains normal aside from degenerative appearing anterior endplate edema at V2-Z3. Intervening disc space loss, and chronic circumferential disc osteophyte complex at that level. Bone marrow signal at the skullbase appears within normal limits.  Cervicomedullary junction is within normal limits. There is degenerative spinal stenosis at C5-C6, stable with mild spinal cord mass effect but no cord signal abnormality identified. Mild cervical spine degeneration elsewhere.  Major vascular flow voids in the neck are preserved there is subcutaneous edema in the region of the mandible. The visible parapharyngeal spaces appear remain normal. The sublingual space remains normal.  IMPRESSION: 1. Persistent and severe abnormal signal throughout the muscle groups of the right neck, from the trapezius to the deep erector spinae muscles. No intramuscular fluid collection or abscess identified. 2. Much more mild but increased abnormal signal in the left erector spinae muscles. 3. Increased retropharyngeal effusion. There is also abnormal signal in the left longus coli muscle similar to that seen in #1, but the right longus coli is normal. 4. Chronic disc and endplate degeneration at C5-C6 with degenerative spinal stenosis. Stable spinal cord mass effect with no cord signal  abnormality.   Electronically Signed   By: Genevie Ann M.D.   On: 07/09/2014 16:47   Mr Cervical Spine Wo Contrast  07/02/2014   CLINICAL DATA:  Fall 4 days ago. Unconscious 12 hours. Neck pain. Left radial nerve palsy.  EXAM: MRI  CERVICAL SPINE WITHOUT CONTRAST  TECHNIQUE: Multiplanar, multisequence MR imaging of the cervical spine was performed. No intravenous contrast was administered.  COMPARISON:  MRI of the brain today  FINDINGS: Image quality is diminished due to difficulty positioning the patient in the coil.  Extensive edema in the erector spinae muscles on the right below the occiput and extending into the upper thoracic spine. The lower extent of this abnormality is not imaged. There is some edema in the medial trapezius muscle on the right. No well-defined fluid collection or hematoma is identified. The muscle bundles are enlarged.  Negative for fracture. No bone marrow edema identified. No mass lesion.  Mild Chiari malformation.  Cervical spine normal alignment  C2-3:  Negative  C3-4: Mild uncinate spurring on the left and mild left facet hypertrophy causing left foraminal narrowing. No cord deformity  C4-5: Mild disc and facet degeneration and spurring. Mild left foraminal narrowing  C5-6: Moderately large central disc protrusion and associated osteophyte with a chronic appearance. There is moderate spinal stenosis with cord flattening. No cord signal abnormality.  C6-7:  Mild degenerative change  C7-T1:  Negative  IMPRESSION: Extensive edema throughout the right erector spinae muscles extending from the occiput to the upper thoracic spine and right medial trapezius muscle. No focal hematoma. Left-sided muscles are normal. No bone marrow edema. Findings are most likely due to muscle edema and possibly muscle necrosis. No evidence of hematoma or abscess.  Negative for fracture  Mild Chiari malformation  Chronic central disc protrusion and spurring at C5-6 with moderate spinal stenosis. No cord injury at this level.  These results were called by telephone at the time of interpretation on 07/02/2014 at 9:22 pm to Dr. Winfred Leeds, who verbally acknowledged these results.   Electronically Signed   By: Franchot Gallo M.D.   On:  07/02/2014 21:24   US Renal  07/03/2014   CLINICAL DATA:  Acute renal failure  EXAM: RENAL/URINARY TRACT ULTRASOUND COMPLETE  COMPARISON:  None.  FINDINGS: Right Kidney:  Length: 12.7 cm. Echogenicity within normal limits. No mass or hydronephrosis visualized.  Left Kidney:  Length: 13.5 cm. Echogenicity within normal limits. No mass or hydronephrosis visualized.  Bladder:  The bladder is decompressed around a Foley catheter and cannot be evaluated.  IMPRESSION: Negative right nephrosis.  No significant abnormality.   Electronically Signed   By: Andreas Newport M.D.   On: 07/03/2014 21:07   US Abdomen Limited Ruq  07/05/2014   CLINICAL DATA:  Initial evaluation elevated liver function enzymes  EXAM: US ABDOMEN LIMITED - RIGHT UPPER QUADRANT  COMPARISON:  None.  FINDINGS: Gallbladder:  No gallstones or wall thickening visualized. No sonographic Murphy sign noted.  Common bile duct:  Diameter: 3 mm  Liver:  No focal lesion identified. Within normal limits in parenchymal echogenicity.  IMPRESSION: Normal right upper quadrant ultrasound   Electronically Signed   By: Skipper Cliche M.D.   On: 07/05/2014 09:04    Oren Binet, MD  [facility] Pager:[phone]  If 7PM-7AM, please contact night-coverage [email or web address] Password [email or web address] 07/09/2014, 5:10 PM   LOS: 7 days

## 2014-07-10 ENCOUNTER — Inpatient Hospital Stay (HOSPITAL_COMMUNITY): Payer: Medicaid Other

## 2014-07-10 DIAGNOSIS — T796XXA Traumatic ischemia of muscle, initial encounter: Secondary | ICD-10-CM

## 2014-07-10 DIAGNOSIS — M542 Cervicalgia: Secondary | ICD-10-CM

## 2014-07-10 DIAGNOSIS — R569 Unspecified convulsions: Secondary | ICD-10-CM

## 2014-07-10 LAB — CBC
HEMATOCRIT: 27 % — AB (ref 36.0–46.0)
HEMOGLOBIN: 8.9 g/dL — AB (ref 12.0–15.0)
MCH: 31.1 pg (ref 26.0–34.0)
MCHC: 33 g/dL (ref 30.0–36.0)
MCV: 94.4 fL (ref 78.0–100.0)
Platelets: 276 10*3/uL (ref 150–400)
RBC: 2.86 MIL/uL — AB (ref 3.87–5.11)
RDW: 13.8 % (ref 11.5–15.5)
WBC: 5.6 10*3/uL (ref 4.0–10.5)

## 2014-07-10 LAB — CK: Total CK: 2283 U/L — ABNORMAL HIGH (ref 7–177)

## 2014-07-10 LAB — BASIC METABOLIC PANEL
ANION GAP: 9 (ref 5–15)
BUN: 27 mg/dL — ABNORMAL HIGH (ref 6–23)
CO2: 28 mmol/L (ref 19–32)
Calcium: 7.8 mg/dL — ABNORMAL LOW (ref 8.4–10.5)
Chloride: 98 mmol/L (ref 96–112)
Creatinine, Ser: 3.75 mg/dL — ABNORMAL HIGH (ref 0.50–1.10)
GFR calc non Af Amer: 14 mL/min — ABNORMAL LOW (ref >90)
GFR, EST AFRICAN AMERICAN: 16 mL/min — AB (ref >90)
Glucose, Bld: 100 mg/dL — ABNORMAL HIGH (ref 70–99)
POTASSIUM: 3.9 mmol/L (ref 3.5–5.1)
Sodium: 135 mmol/L (ref 135–145)

## 2014-07-10 LAB — HEPATIC FUNCTION PANEL
ALBUMIN: 2.8 g/dL — AB (ref 3.5–5.2)
ALK PHOS: 34 U/L — AB (ref 39–117)
ALT: 45 U/L — AB (ref 0–35)
AST: 59 U/L — ABNORMAL HIGH (ref 0–37)
Bilirubin, Direct: 0.1 mg/dL (ref 0.0–0.5)
Indirect Bilirubin: 0.5 mg/dL (ref 0.3–0.9)
TOTAL PROTEIN: 6.1 g/dL (ref 6.0–8.3)
Total Bilirubin: 0.6 mg/dL (ref 0.3–1.2)

## 2014-07-10 MED ORDER — OXYCODONE HCL 5 MG PO TABS
5.0000 mg | ORAL_TABLET | ORAL | Status: DC | PRN
  Administered 2014-07-10 – 2014-07-11 (×4): 5 mg via ORAL
  Filled 2014-07-10 (×4): qty 1

## 2014-07-10 MED ORDER — PREDNISONE 20 MG PO TABS
40.0000 mg | ORAL_TABLET | Freq: Every day | ORAL | Status: DC
  Administered 2014-07-10 – 2014-07-13 (×4): 40 mg via ORAL
  Filled 2014-07-10 (×4): qty 2

## 2014-07-10 NOTE — Procedures (Signed)
EEG report.  Brief clinical history:  Pt is a 44 yo female admitted with CRI, possible rhabdomyelosis from edema in erector spinae ms from recent fall. Pt fell at night and came to next morning on bathroom floor. Did not come to hospital until 4 days later. Pt has weakness in L UE and significant swelling in LUE as well as neck pain.   Technique: this is a 17 channel routine scalp EEG performed at the bedside with bipolar and monopolar montages arranged in accordance to the international 10/20 system of electrode placement. One channel was dedicated to EKG recording.  The study was performed during wakefulness, drowsiness, and stage 2 sleep. Intermittent photic stimulation was the sole activating procedure performed.  Description:In the wakeful state, the best background consisted of a medium amplitude, posterior dominant, well sustained, symmetric and reactive 10 Hz rhythm. Drowsiness demonstrated dropout of the alpha rhythm. Stage 2 sleep showed symmetric and synchronous sleep spindles without intermixed epileptiform discharges. Intermittent photic stimulation did induce a normal driving response.  No focal or generalized epileptiform discharges noted.  No pathologic areas of slowing seen.  EKG showed sinus rhythm.  Impression: this is a normal awake and asleep EEG. Please, be aware that a normal EEG does not exclude the possibility of epilepsy.  Clinical correlation is advised.   Dorian Pod, MD

## 2014-07-10 NOTE — Progress Notes (Signed)
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  Pt fell at night and came to next morning on bathroom floor.  Did  not come to hospital until 4 days later.  Pt has weakness in L UE and significant swelling in LUE as well as neck pain.     OT comments  Pt still with slight decreased digit extension in the left hand but continuing to improve and pt using more with functional tasks.  Also still with increased swelling in the left elbow as well as posterior neck, limiting cervical extension and rotation to the right side.  Overall supervision level for mobility and selfcare but quickly approaching modified independent level. Will continue to follow for acute care, OT goals updated to reflect progress.   Follow Up Recommendations  Outpatient OT    Equipment Recommendations  None recommended by OT    Recommendations for Other Services      Precautions / Restrictions Precautions Precautions: Fall Restrictions Weight Bearing Restrictions: No          Balance     Sitting balance-Leahy Scale: Good       Standing balance-Leahy Scale: Good                     ADL Overall ADL's : Needs assistance/impaired     Grooming: Wash/dry hands;Oral care;Standing               Lower Body Dressing: Supervision/safety   Toilet Transfer: Sales executive;Ambulation       Tub/ Shower Transfer: Supervision/safety;Ambulation Tub/Shower Transfer Details (indicate cue type and reason): Pt able to step over edge of the tub. Functional mobility during ADLs: Supervision/safety General ADL Comments: Pt feeling better about her left hand use today still with slight decreased in MP extension, especially in the middle digit with functional use.  She is able  to use it with increased time for manipulation of objects such as her cup when drinking or the toothpaste.  Encouraged pt to continue working on exercises for digit extension every hour.  She was also able to demonstrate slightly more cervical rotation to the right side today but still limited.Pt  able to ambulate to the bathroom and in the hallway without use of an assistive device with close supervision.  Occasional LOB with head turns while ambulating but able to self correct without assistance.  Provided heat pack for use on posterior neck at end of session.                 Cognition   Behavior During Therapy: WFL for tasks assessed/performed Overall Cognitive Status: Within Functional Limits for tasks assessed                                    Pertinent Vitals/ Pain       Pain Assessment: Faces Faces Pain Scale: Hurts a little bit Pain Location: neck Pain Descriptors / Indicators: Aching Pain Intervention(s): Limited activity within patient's tolerance;Monitored during session;Heat applied         Frequency Min 3X/week     Progress Toward Goals  OT Goals(current goals can now be found in the care plan section)  Progress towards OT goals: Goals met and updated -  see care plan  Acute Rehab OT Goals Time For Goal Achievement: 07/17/14 Potential to Achieve Goals: Good ADL Goals Pt/caregiver will Perform Home Exercise Program: Left upper extremity (Increase digit AROM extension to [facility] for greater efficiency) Additional ADL Goal #1: Pt will complete all bathing, dressing, and grooming with independent. Additional ADL Goal #2: Pt will perform all shower transfers with independence Additional ADL Goal #3: Pt will demonstrate full AROM cervical extension and rotation to the right for greater safety with mobility and selfcare tasks.   Plan Discharge plan remains appropriate       End of Session Equipment Utilized During Treatment: Gait belt   Activity Tolerance  Patient tolerated treatment well   Patient Left in bed   Nurse Communication Mobility status        Time: 9471-2527 OT Time Calculation (min): 38 min  Charges: OT General Charges $OT Visit: 1 Procedure OT Treatments $Self Care/Home Management : 23-37 mins $Therapeutic Activity: 8-22 mins  Bradon Fester OTR/L 07/10/2014, 11:45 AM

## 2014-07-10 NOTE — Progress Notes (Signed)
PATIENT DETAILS Name: Jill Hopkins Age: 44 y.o. Sex: female Date of Birth: Apr 27, 1970 Admit Date: 07/02/2014 Admitting Physician Theressa Millard, MD [id number], MD  Subjective: No major events overnight, continues to have pain in the right side of her neck  Assessment/Plan: Principal Problem:   Acute renal failure: Secondary to rhabdomyolysis. Admitted and started on IV fluids and nephrology consulted. Creatinine now slowly down trending. Follow electrolytes,creatinine slowly down trending. IV fluids have been discontinued on 3/21 because of generalized swelling.  Active Problems:   Rhabdomyolysis:Likely secondary to muscle injury from a fall/being down in the bathroom for at least 12 hours a few days prior to this admission. She was hydrated, CK has now slowly started to trend down. We'll follow CK to that normalizes    Swelling of neck muscles: Suspect this is secondary to compression/injury when she was on the floor a few days prior to admission. Repeat MRI on 3/23 essentially unchanged and continues swelling of her muscles without any evidence of abscess or hematoma. Have spoken with ENT-Dr. Wilburn Cornelia, he did not have any further recommendations apart from talking with the neurosurgeon. Subsequently spoke with Dr. Saintclair Halsted who personally reviewed the MRI C-spine, and had no further recommendations apart from continuing supportive care, checking blood cultures. I also spoke with Dr. Hall-radiologist on 3/24, most part that this was most consistent with rhabdomyolysis. Her vascular structures in the MRI appeared normal, recent bilateral upper extremity Doppler was negative for DVT. Her autoimmune panel including dsDNA was negative. ESR slightly elevated at 30. Suspect it would start a trial of anti-inflammatories-given that she has acute renal failure, cannot start NSAIDs, if blood cultures remain negative we will go head and give her trial of steroids.    Syncope: This  occurred a few days prior to admission. MRI of the brain was negative. 2-D echocardiogram negative as well. Telemetry was negative. A urine drug screen was positive for cocaine (denies use). Following closely monitor for now.    Hypertension: Controlled. Continue with metoprolol.    Hypothyroidism: Continue with levothyroxine-but since TSH elevated-increased to 150 mcg. Repeat TSH in 3 months    Bacterial vaginosis: Continue Flagyl currently on day 5 of 7 treatment    Left forearm/arm swelling: Doppler negative. Likely secondary to edema/compression from fall. Following monitor closely.    Elevated liver enzymes: Suspect secondary to rhabdomyolysis. Follow LFTs periodically.    Hyponatremia: Resolved    Hypokalemia: Resolved    Suspected drug abuse: Urine drug screen positive for cocaine and opiates, Patient vehemently denies. However polysubstance use-could explain the syncopal episode causing her to pass out for almost 12 hours.      History of depression: Continue with nortriptyline, Abilify.  Disposition: Remain inpatient-suspect requires a few more days of hospitalization prior to discharge.  Antibiotics:  See below   Anti-infectives    Start     Dose/Rate Route Frequency Ordered Stop   07/04/14 2200  metroNIDAZOLE (FLAGYL) tablet 500 mg     500 mg Oral Every 12 hours 07/04/14 1714        DVT Prophylaxis: Start Prophylactic Heparin   Code Status: Full code   Family Communication Boyfriend sleeping in the room-spoke with patient-she was okay with her boyfriend being in the room. No issues regarding polysubstance abuse was discussed.  Procedures:  None  CONSULTS:  nephrology   [id number] over the phone 3/23  Neurosurgery Dr Saintclair Halsted: over the phone 3/23  Radiology-Dr Nevada Crane over the phone 3/24  MEDICATIONS: Scheduled Meds: . ARIPiprazole  2 mg Oral Daily  . buPROPion  150 mg Oral BID  . ferrous sulfate  325 mg Oral Daily  . gabapentin  300 mg Oral BID  .  heparin subcutaneous  5,000 Units Subcutaneous 3 times per day  . levothyroxine  150 mcg Oral QAC breakfast  . metoprolol succinate  50 mg Oral Daily  . metroNIDAZOLE  500 mg Oral Q12H  . nortriptyline  25 mg Oral QHS  . pantoprazole  40 mg Oral QHS   Continuous Infusions:  PRN Meds:.acetaminophen **OR** acetaminophen, cyclobenzaprine, HYDROmorphone (DILAUDID) injection, ondansetron **OR** ondansetron (ZOFRAN) IV, oxyCODONE, zolpidem    PHYSICAL EXAM: Vital signs in last 24 hours: Filed Vitals:   07/09/14 1251 07/09/14 2112 07/10/14 0601 07/10/14 0922  BP: 139/92 121/86 133/93 122/77  Pulse: 71 69 78 71  Temp: 98.4 F (36.9 C) 98.4 F (36.9 C) 98.7 F (37.1 C) 98 F (36.7 C)  TempSrc:    Oral  Resp: _0 Height:      Weight:      SpO2:  99% 98% 100%    Weight change:  Filed Weights   07/03/14 0121  Weight: 83.008 kg (183 lb)   Body mass index is 31.4 kg/(m^2).   Gen Exam: Awake and alert with clear speech. Lower right facial swelling in the right mandibular area. Neck: Mostly right posterior neck swelling evident. Neck is supple. Patient able to move neckleft more than right laterally.  Chest: B/L Clear.  No rales CVS: S1 S2 Regular, no murmurs.  Abdomen: soft, BS +, non tender, non distended.  Lower Extremities: no edema, lower extremities warm to touch. Mild swelling of her left upper extremity (improving slowly per patient) Neurologic: Non Focal.   Skin: No Rash.   Wounds: N/A.  Intake/Output from previous day:  Intake/Output Summary (Last 24 hours) at 07/10/14 1039 Last data filed at 07/09/14 1700  Gross per 24 hour  Intake    600 ml  Output      0 ml  Net    600 ml     LAB RESULTS: CBC  Recent Labs Lab 07/05/14 0552 07/06/14 0652 07/07/14 0730 07/09/14 0739 07/10/14 0628  WBC 6.1 6.3 5.4 4.6 5.6  HGB 11.1* 10.8* 9.9* 8.6* 8.9*  HCT 33.3* 32.5* 29.8* 26.1* 27.0*  PLT 318 286 259 251 276  MCV 92.0 93.4 94.0 93.2 94.4  MCH 30.7 31.0  31.2 30.7 31.1  MCHC 33.3 33.2 33.2 33.0 33.0  RDW 13.7 13.6 13.7 13.6 13.8  LYMPHSABS  --  1.1  --   --   --   MONOABS  --  0.5  --   --   --   EOSABS  --  0.4  --   --   --   BASOSABS  --  0.0  --   --   --     Chemistries   Recent Labs Lab 07/06/14 0652 07/07/14 0730 07/08/14 0536 07/09/14 0739 07/10/14 0628  NA 134* 135 134* 135 135  K 3.3* 3.4* 3.4* 3.1* 3.9  CL 81* 90* 89* 96 98  CO2 44* 37* 36* 28 28  GLUCOSE 90 89 138* 111* 100*  BUN 32* 30* 28* 25* 27*  CREATININE 6.54* 5.62* 5.18* 4.30* 3.75*  CALCIUM 8.0* 7.7* 7.6* 8.0* 7.8*    CBG: No results for input(s): GLUCAP in the last 168 hours.  GFR Estimated Creatinine Clearance: 20.6 mL/min (by C-G formula based on Cr  of 3.75).  Coagulation profile No results for input(s): INR, PROTIME in the last 168 hours.  Cardiac Enzymes No results for input(s): CKMB, TROPONINI, MYOGLOBIN in the last 168 hours.  Invalid input(s): CK  Invalid input(s): POCBNP No results for input(s): DDIMER in the last 72 hours. No results for input(s): HGBA1C in the last 72 hours. No results for input(s): CHOL, HDL, LDLCALC, TRIG, CHOLHDL, LDLDIRECT in the last 72 hours. No results for input(s): TSH, T4TOTAL, T3FREE, THYROIDAB in the last 72 hours.  Invalid input(s): FREET3 No results for input(s): VITAMINB12, FOLATE, FERRITIN, TIBC, IRON, RETICCTPCT in the last 72 hours. No results for input(s): LIPASE, AMYLASE in the last 72 hours.  Urine Studies No results for input(s): UHGB, CRYS in the last 72 hours.  Invalid input(s): UACOL, UAPR, USPG, UPH, UTP, UGL, UKET, UBIL, UNIT, UROB, ULEU, UEPI, UWBC, URBC, UBAC, CAST, UCOM, BILUA  MICROBIOLOGY: Recent Results (from the past 240 hour(s))  Wet prep, genital     Status: Abnormal   Collection Time: 07/02/14  7:00 PM  Result Value Ref Range Status   Yeast Wet Prep HPF POC NONE SEEN NONE SEEN Final   Trich, Wet Prep NONE SEEN NONE SEEN Final   Clue Cells Wet Prep HPF POC FEW (A) NONE  SEEN Final   WBC, Wet Prep HPF POC FEW (A) NONE SEEN Final    RADIOLOGY STUDIES/RESULTS: Ct Head Wo Contrast  07/06/2014   CLINICAL DATA:  H/o depression, renal disorder, hypertension, hypothyroid, 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 She reports that she fell in the bathroom and woke up on the floor the next morning. She does not remember why she fell, she denies drinking alcohol or using drugs. She reports having lesions to the side of her neck and face after the fall. She complains of pain over her right neck. She reports feeling like her hand cannot straighten out. She denies recent illness. She does not take any medications currently for herpes. She does report some vaginal discharge for 3 weeks. The patient denies fevers, cough, wheezing, shortness of breath, vaginal lesions, vaginal leading, dysuria, hematuria, urinary frequency, urinary urgency, or difficulty walking  EXAM: CT HEAD WITHOUT CONTRAST  CT CERVICAL SPINE WITHOUT CONTRAST  TECHNIQUE: Multidetector CT imaging of the head and cervical spine was performed following the standard protocol without intravenous contrast. Multiplanar CT image reconstructions of the cervical spine were also generated.  COMPARISON:  06/03/2011  FINDINGS: CT HEAD FINDINGS  Ventricles are normal in size and configuration. No parenchymal masses or mass effect. No evidence of an infarct. There are no extra-axial masses or abnormal fluid collections.  No intracranial hemorrhage.  No skull fracture. Visualized sinuses and mastoid air cells are clear.  There is low attenuation within the right superior posterior paraspinal musculature with some overlying subcutaneous edema.  CT CERVICAL SPINE FINDINGS  There is low-attenuation mild thickening of the right posterior paraspinal muscles as well as a right medial  trapezius. This may be due to muscle strain. Subcutaneous edema is noted along the sub posterior neck soft tissues, greater on the right as well as the right side of the face and neck extending to the upper chest, again greater on the right.  No soft tissue mass, hematoma or adenopathy.  There is no evidence of fracture. No spondylolisthesis. There is moderate  loss disc height at C5-C6. Endplate spurring is noted at this level. No evidence of a disc herniation. Neural foramina are well preserved.  IMPRESSION: 1. No intracranial abnormality.  No skull fracture. 2. Abnormal low attenuation throughout the right posterior paraspinal musculature. There is also subcutaneous edema which is most evident along the right posterior lateral neck right face extending to the right upper chest. The low-attenuation in the right posterior paraspinal musculature is likely muscle edema from a strain, but is nonspecific. 3. No cervical fracture or spondylolisthesis.   Electronically Signed   By: Lajean Manes M.D.   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 She reports that she fell in the bathroom and woke up on the floor the next morning. She does not remember why she fell, she denies drinking alcohol or using drugs. She reports having lesions to the side of her neck and face after the fall. She complains of pain over her right neck. She reports feeling like her hand cannot straighten out. She denies recent illness. She does not take any medications currently for herpes. She does report some vaginal discharge for 3 weeks. The patient denies fevers, cough, wheezing, shortness of breath, vaginal lesions, vaginal leading, dysuria,  hematuria, urinary frequency, urinary urgency, or difficulty walking  EXAM: CT HEAD WITHOUT CONTRAST  CT CERVICAL SPINE WITHOUT CONTRAST  TECHNIQUE: Multidetector CT imaging of the head and cervical spine was performed following the standard protocol without intravenous contrast. Multiplanar CT image reconstructions of the cervical spine were also generated.  COMPARISON:  06/03/2011  FINDINGS: CT HEAD FINDINGS  Ventricles are normal in size and configuration. No parenchymal masses or mass effect. No evidence of an infarct. There are no extra-axial masses or abnormal fluid collections.  No intracranial hemorrhage.  No skull fracture. Visualized sinuses and mastoid air cells are clear.  There is low attenuation within the right superior posterior paraspinal musculature with some overlying subcutaneous edema.  CT CERVICAL SPINE FINDINGS  There is low-attenuation mild thickening of the right posterior paraspinal muscles as well as a right medial trapezius. This may be due to muscle strain. Subcutaneous edema is noted along the sub posterior neck soft tissues, greater on the right as well as the right side of the face and neck extending to the upper chest, again greater on the right.  No soft tissue mass, hematoma or adenopathy.  There is no evidence of fracture. No spondylolisthesis. There is moderate loss disc height at C5-C6. Endplate spurring is noted at this level. No evidence of a disc herniation. Neural foramina are well preserved.  IMPRESSION: 1. No intracranial abnormality.  No skull fracture. 2. Abnormal low attenuation throughout the right posterior paraspinal musculature. There is also subcutaneous edema which is most evident along the right posterior lateral neck right face extending to the right upper chest. The low-attenuation in the right posterior paraspinal musculature is likely muscle edema from a strain, but is nonspecific. 3. No cervical fracture or spondylolisthesis.   Electronically Signed   By:  Lajean Manes M.D.   On: 07/06/2014 15:14   Mr Brain Wo Contrast  07/02/2014   CLINICAL DATA:  [place] 4 days ago and unconscious for 12 hours.  Now with neck pain. Left radial nerve palsy  EXAM: MRI HEAD WITHOUT CONTRAST  TECHNIQUE: Multiplanar, multiecho pulse sequences of the brain and surrounding structures were obtained without intravenous contrast.  COMPARISON:  CT head 06/03/2011  FINDINGS: Cerebellar tonsils extend 8 mm below the foramen magnum compatible with mild Chiari malformation. Negative for hydrocephalus. Pituitary normal in size.  Negative for acute infarct.  Negative for chronic ischemia.  Negative for demyelinating disease. Cerebral white matter normal. Brainstem and basal ganglia normal  Negative for intracranial hemorrhage or fluid collection.  Negative for mass or edema.  No shift of the midline structures.  Mild mucosal edema paranasal sinuses  There is extensive edema in the erector spinae muscles of the right posterior neck below the occiput. This is unilateral and involves all of the muscles. This may reach related to acute muscle injury from the patient's fall or subsequent positioning while unconscious. This may lead to rhabdomyolysis.  IMPRESSION: Mild Chiari malformation.  No acute intracranial abnormality.  Extensive edema in the erector spinae muscles in the right neck, worrisome for acute muscle injury. This may lead to rhabdomyolysis.   Electronically Signed   By: Franchot Gallo M.D.   On: 07/02/2014 20:58   Mr Cervical Spine Wo Contrast  07/09/2014   CLINICAL DATA:  44 year old female with cervical muscle pain. Rhabdomyolysis suspected following loss of consciousness for 12 hours. Subsequent encounter.  EXAM: MRI CERVICAL SPINE WITHOUT CONTRAST  TECHNIQUE: Multiplanar, multisequence MR imaging of the cervical spine was performed. No intravenous contrast was administered.  COMPARISON:  Cervical spine MRI 07/02/2014.  FINDINGS: Continued diffuse abnormal superficial right neck and  deep paraspinal muscle signal and enlargement, best seen today on coronal STIR image 5 of series 600.  Very mild or minimal abnormal signal in the left erector spinae muscles (image 5), but this has progressed since the prior study (series 500 image 10).  Increased prevertebral or retropharyngeal effusion extending from the C2 level to the C6 level. Also in this region, there is asymmetric signal and enlargement throughout the left longus coli muscle (series 8, image 18) although the right remains normal. This is similar to the prior study.  Cervical spine bone marrow signal remains normal aside from degenerative appearing anterior endplate edema at H4-L9. Intervening disc space loss, and chronic circumferential disc osteophyte complex at that level. Bone marrow signal at the skullbase appears within normal limits.  Cervicomedullary junction is within normal limits. There is degenerative spinal stenosis at C5-C6, stable with mild spinal cord mass effect but no cord signal abnormality identified. Mild cervical spine degeneration elsewhere.  Major vascular flow voids in the neck are preserved there is subcutaneous edema in the region of the mandible. The visible parapharyngeal spaces appear remain normal. The sublingual space remains normal.  IMPRESSION: 1. Persistent and severe abnormal signal throughout the muscle groups of the right neck, from the trapezius to the deep erector spinae muscles. No intramuscular fluid collection or abscess identified. 2. Much more mild but increased abnormal signal in the left erector spinae muscles. 3. Increased retropharyngeal effusion. There is also abnormal signal in the left longus coli muscle similar to that seen in #1, but the right longus coli is normal. 4. Chronic disc and endplate degeneration at C5-C6 with degenerative spinal stenosis. Stable spinal cord mass effect with no cord signal abnormality.   Electronically Signed   By: Genevie Ann M.D.   On: 07/09/2014 16:47   Mr  Cervical Spine Wo Contrast  07/02/2014   CLINICAL  DATA:  Fall 4 days ago. Unconscious 12 hours. Neck pain. Left radial nerve palsy.  EXAM: MRI CERVICAL SPINE WITHOUT CONTRAST  TECHNIQUE: Multiplanar, multisequence MR imaging of the cervical spine was performed. No intravenous contrast was administered.  COMPARISON:  MRI of the brain today  FINDINGS: Image quality is diminished due to difficulty positioning the patient in the coil.  Extensive edema in the erector spinae muscles on the right below the occiput and extending into the upper thoracic spine. The lower extent of this abnormality is not imaged. There is some edema in the medial trapezius muscle on the right. No well-defined fluid collection or hematoma is identified. The muscle bundles are enlarged.  Negative for fracture. No bone marrow edema identified. No mass lesion.  Mild Chiari malformation.  Cervical spine normal alignment  C2-3:  Negative  C3-4: Mild uncinate spurring on the left and mild left facet hypertrophy causing left foraminal narrowing. No cord deformity  C4-5: Mild disc and facet degeneration and spurring. Mild left foraminal narrowing  C5-6: Moderately large central disc protrusion and associated osteophyte with a chronic appearance. There is moderate spinal stenosis with cord flattening. No cord signal abnormality.  C6-7:  Mild degenerative change  C7-T1:  Negative  IMPRESSION: Extensive edema throughout the right erector spinae muscles extending from the occiput to the upper thoracic spine and right medial trapezius muscle. No focal hematoma. Left-sided muscles are normal. No bone marrow edema. Findings are most likely due to muscle edema and possibly muscle necrosis. No evidence of hematoma or abscess.  Negative for fracture  Mild Chiari malformation  Chronic central disc protrusion and spurring at C5-6 with moderate spinal stenosis. No cord injury at this level.  These results were called by telephone at the time of interpretation on  07/02/2014 at 9:22 pm to Dr. Winfred Leeds, who verbally acknowledged these results.   Electronically Signed   By: Franchot Gallo M.D.   On: 07/02/2014 21:24   US Renal  07/03/2014   CLINICAL DATA:  Acute renal failure  EXAM: RENAL/URINARY TRACT ULTRASOUND COMPLETE  COMPARISON:  None.  FINDINGS: Right Kidney:  Length: 12.7 cm. Echogenicity within normal limits. No mass or hydronephrosis visualized.  Left Kidney:  Length: 13.5 cm. Echogenicity within normal limits. No mass or hydronephrosis visualized.  Bladder:  The bladder is decompressed around a Foley catheter and cannot be evaluated.  IMPRESSION: Negative right nephrosis.  No significant abnormality.   Electronically Signed   By: Andreas Newport M.D.   On: 07/03/2014 21:07   US Abdomen Limited Ruq  07/05/2014   CLINICAL DATA:  Initial evaluation elevated liver function enzymes  EXAM: US ABDOMEN LIMITED - RIGHT UPPER QUADRANT  COMPARISON:  None.  FINDINGS: Gallbladder:  No gallstones or wall thickening visualized. No sonographic Murphy sign noted.  Common bile duct:  Diameter: 3 mm  Liver:  No focal lesion identified. Within normal limits in parenchymal echogenicity.  IMPRESSION: Normal right upper quadrant ultrasound   Electronically Signed   By: Skipper Cliche M.D.   On: 07/05/2014 09:04    Oren Binet, MD  [facility] Pager:[phone]  If 7PM-7AM, please contact night-coverage [email or web address] Password [facility] 07/10/2014, 10:39 AM   LOS: 8 days

## 2014-07-10 NOTE — Progress Notes (Signed)
EEG Completed; Results Pending  

## 2014-07-10 NOTE — Progress Notes (Signed)
VASCULAR LAB PRELIMINARY  PRELIMINARY  PRELIMINARY  PRELIMINARY  Carotid Duplex completed.    Preliminary report:  Right ICA velocities suggest stenosis within the 1-39% range.                                 Left ICA velocities within the lower end of the 40-59% range of stenosis.                                 Patent vertebral arteries with antegrade flow bilaterally.  August Albino, RVT 07/10/2014, 4:18 PM

## 2014-07-11 LAB — CBC
HCT: 25.6 % — ABNORMAL LOW (ref 36.0–46.0)
Hemoglobin: 8.6 g/dL — ABNORMAL LOW (ref 12.0–15.0)
MCH: 31.4 pg (ref 26.0–34.0)
MCHC: 33.6 g/dL (ref 30.0–36.0)
MCV: 93.4 fL (ref 78.0–100.0)
Platelets: 274 10*3/uL (ref 150–400)
RBC: 2.74 MIL/uL — ABNORMAL LOW (ref 3.87–5.11)
RDW: 13.6 % (ref 11.5–15.5)
WBC: 7.3 10*3/uL (ref 4.0–10.5)

## 2014-07-11 LAB — COMPREHENSIVE METABOLIC PANEL
ALBUMIN: 3.2 g/dL — AB (ref 3.5–5.2)
ALK PHOS: 37 U/L — AB (ref 39–117)
ALT: 45 U/L — ABNORMAL HIGH (ref 0–35)
ANION GAP: 14 (ref 5–15)
AST: 65 U/L — ABNORMAL HIGH (ref 0–37)
BILIRUBIN TOTAL: 0.7 mg/dL (ref 0.3–1.2)
BUN: 25 mg/dL — AB (ref 6–23)
CO2: 23 mmol/L (ref 19–32)
CREATININE: 3.13 mg/dL — AB (ref 0.50–1.10)
Calcium: 8.2 mg/dL — ABNORMAL LOW (ref 8.4–10.5)
Chloride: 97 mmol/L (ref 96–112)
GFR calc non Af Amer: 17 mL/min — ABNORMAL LOW (ref >90)
GFR, EST AFRICAN AMERICAN: 20 mL/min — AB (ref >90)
GLUCOSE: 173 mg/dL — AB (ref 70–99)
POTASSIUM: 4.5 mmol/L (ref 3.5–5.1)
Sodium: 134 mmol/L — ABNORMAL LOW (ref 135–145)
Total Protein: 6.7 g/dL (ref 6.0–8.3)

## 2014-07-11 LAB — CK: CK TOTAL: 2138 U/L — AB (ref 7–177)

## 2014-07-11 MED ORDER — HYDROMORPHONE HCL 1 MG/ML IJ SOLN
0.5000 mg | Freq: Three times a day (TID) | INTRAMUSCULAR | Status: DC | PRN
  Administered 2014-07-11 – 2014-07-12 (×3): 0.5 mg via INTRAVENOUS
  Filled 2014-07-11 (×3): qty 1

## 2014-07-11 MED ORDER — OXYCODONE HCL 5 MG PO TABS
10.0000 mg | ORAL_TABLET | ORAL | Status: DC | PRN
  Administered 2014-07-11 – 2014-07-12 (×4): 10 mg via ORAL
  Filled 2014-07-11 (×4): qty 2

## 2014-07-11 NOTE — Progress Notes (Signed)
PATIENT DETAILS Name: Jill Hopkins Age: 44 y.o. Sex: female Date of Birth: [birth year] Admit Date: 07/02/2014 Admitting Physician Theressa Millard, MD [id number]  Subjective: No major events overnight, continues to have right neck pain/swelling-essentially unchanged  Assessment/Plan: Principal Problem:   Acute renal failure: Secondary to rhabdomyolysis. Admitted and started on IV fluids and nephrology consulted. Creatinine now slowly down trending. Follow electrolytes,creatinine slowly down trending. IV fluids have been discontinued on 3/21 because of generalized swelling.Continue to follow lytes.   Active Problems:   Rhabdomyolysis:Likely secondary to muscle injury from a fall/being down in the bathroom for at least 12 hours a few days prior to this admission. She was hydrated, CK has now slowly started to trend down. Continue to follow CK    Swelling of neck muscles: Suspect this is secondary to compression/injury when she was on the floor a few days prior to admission. Repeat MRI on 3/23 essentially unchanged and continues swelling of her muscles without any evidence of abscess or hematoma. Spoke with ENT-Dr. Wilburn Cornelia on 12/23, he did not have any further recommendations apart from talking with the neurosurgeon. Subsequently spoke with Dr. Saintclair Halsted on 12/23 who personally reviewed the MRI C-spine, and had no further recommendations apart from continuing supportive care, checking blood cultures. I also spoke with Dr. Hall-radiologist on 3/24, most part that this was most consistent with rhabdomyolysis. Her vascular structures in the MRI appeared normal, recent bilateral upper extremity Doppler was negative for DVT. Her autoimmune panel including dsDNA was negative. ESR slightly elevated at 30. Have started trial of steroids on 3/24, no significant change yet, will follow.     Syncope: This occurred a few days prior to admission. MRI of the brain was negative.  2-D echocardiogram negative as well. Telemetry was negative.EEG was negative. MRI brain neg for CVA. A urine drug screen was positive for cocaine (denies use). Following closely monitor for now.    Hypertension: Controlled. Continue with metoprolol.    Hypothyroidism: Continue with levothyroxine-but since TSH elevated-increased to 150 mcg. Repeat TSH in 3 months    Bacterial vaginosis: Continue Flagyl currently on day 6 of 7 treatment    Left forearm/arm swelling: Doppler negative. Likely secondary to edema/compression from fall. Follow and monitor closely-remains essentially the same.     Elevated liver enzymes: Suspect secondary to rhabdomyolysis. LFT's have normalized.    Hyponatremia: Resolved    Hypokalemia: Resolved    Suspected drug abuse: Urine drug screen positive for cocaine and opiates, Patient vehemently denies. However polysubstance use-could explain the syncopal episode causing her to pass out for almost 12 hours.      History of depression: Continue with nortriptyline, Abilify.  Disposition: Remain inpatient-home over the weekend.  Antibiotics:  See below   Anti-infectives    Start     Dose/Rate Route Frequency Ordered Stop   07/04/14 2200  metroNIDAZOLE (FLAGYL) tablet 500 mg     500 mg Oral Every 12 hours 07/04/14 1714        DVT Prophylaxis:  Prophylactic Heparin   Code Status: Full code   Family Communication Boyfriend sleeping in the room-spoke with patient-after she gave persmission  Procedures:  None  CONSULTS:  nephrology   LPN:[id number] over the phone 3/23  Neurosurgery Dr Saintclair Halsted: over the phone 3/23  Radiology-Dr Nevada Crane over the phone 3/24   MEDICATIONS: Scheduled Meds: . ARIPiprazole  2 mg Oral Daily  . buPROPion  150 mg Oral BID  .  ferrous sulfate  325 mg Oral Daily  . gabapentin  300 mg Oral BID  . heparin subcutaneous  5,000 Units Subcutaneous 3 times per day  . levothyroxine  150 mcg Oral QAC breakfast  . metoprolol succinate   50 mg Oral Daily  . metroNIDAZOLE  500 mg Oral Q12H  . nortriptyline  25 mg Oral QHS  . pantoprazole  40 mg Oral QHS  . predniSONE  40 mg Oral Q breakfast   Continuous Infusions:  PRN Meds:.acetaminophen **OR** acetaminophen, cyclobenzaprine, HYDROmorphone (DILAUDID) injection, ondansetron **OR** ondansetron (ZOFRAN) IV, oxyCODONE, zolpidem    PHYSICAL EXAM: Vital signs in last 24 hours: Filed Vitals:   07/10/14 1700 07/11/14 0302 07/11/14 0601 07/11/14 0852  BP: 134/79 121/85 126/82 126/82  Pulse: 78 80 78 78  Temp: 98.3 F (36.8 C) 98.2 F (36.8 C) 98.3 F (36.8 C)   TempSrc: Oral     Resp: 16 16 16    Height:      Weight:      SpO2: 100% 98% 98%     Weight change:  Filed Weights   07/03/14 0121  Weight: 83.008 kg (183 lb)   Body mass index is 31.4 kg/(m^2).   Gen Exam: Awake and alert with clear speech. Lower right facial swelling in the right mandibular area. Neck: Mostly right posterior neck swelling evident. Neck is supple. Patient able to move neck left more than right laterally.  Chest: B/L Clear.  No rales CVS: S1 S2 Regular, no murmurs.  Abdomen: soft, BS +, non tender, non distended.  Lower Extremities: no edema, lower extremities warm to touch. Mild swelling of her left upper extremity (improving slowly per patient) Neurologic: Non Focal.   Skin: No Rash.   Wounds: N/A.  Intake/Output from previous day:  Intake/Output Summary (Last 24 hours) at 07/11/14 1218 Last data filed at 07/11/14 0830  Gross per 24 hour  Intake    730 ml  Output      0 ml  Net    730 ml     LAB RESULTS: CBC  Recent Labs Lab 07/06/14 0652 07/07/14 0730 07/09/14 0739 07/10/14 0628 07/11/14 0523  WBC 6.3 5.4 4.6 5.6 7.3  HGB 10.8* 9.9* 8.6* 8.9* 8.6*  HCT 32.5* 29.8* 26.1* 27.0* 25.6*  PLT 286 259 251 276 274  MCV 93.4 94.0 93.2 94.4 93.4  MCH 31.0 31.2 30.7 31.1 31.4  MCHC 33.2 33.2 33.0 33.0 33.6  RDW 13.6 13.7 13.6 13.8 13.6  LYMPHSABS 1.1  --   --   --   --     MONOABS 0.5  --   --   --   --   EOSABS 0.4  --   --   --   --   BASOSABS 0.0  --   --   --   --     Chemistries   Recent Labs Lab 07/07/14 0730 07/08/14 0536 07/09/14 0739 07/10/14 0628 07/11/14 0523  NA 135 134* 135 135 134*  K 3.4* 3.4* 3.1* 3.9 4.5  CL 90* 89* 96 98 97  CO2 37* 36* 28 28 23   GLUCOSE 89 138* 111* 100* 173*  BUN 30* 28* 25* 27* 25*  CREATININE 5.62* 5.18* 4.30* 3.75* 3.13*  CALCIUM 7.7* 7.6* 8.0* 7.8* 8.2*    CBG: No results for input(s): GLUCAP in the last 168 hours.  GFR Estimated Creatinine Clearance: 24.6 mL/min (by C-G formula based on Cr of 3.13).  Coagulation profile No results for input(s): INR, PROTIME in  the last 168 hours.  Cardiac Enzymes No results for input(s): CKMB, TROPONINI, MYOGLOBIN in the last 168 hours.  Invalid input(s): CK  Invalid input(s): POCBNP No results for input(s): DDIMER in the last 72 hours. No results for input(s): HGBA1C in the last 72 hours. No results for input(s): CHOL, HDL, LDLCALC, TRIG, CHOLHDL, LDLDIRECT in the last 72 hours. No results for input(s): TSH, T4TOTAL, T3FREE, THYROIDAB in the last 72 hours.  Invalid input(s): FREET3 No results for input(s): VITAMINB12, FOLATE, FERRITIN, TIBC, IRON, RETICCTPCT in the last 72 hours. No results for input(s): LIPASE, AMYLASE in the last 72 hours.  Urine Studies No results for input(s): UHGB, CRYS in the last 72 hours.  Invalid input(s): UACOL, UAPR, USPG, UPH, UTP, UGL, UKET, UBIL, UNIT, UROB, ULEU, UEPI, UWBC, URBC, UBAC, CAST, UCOM, BILUA  MICROBIOLOGY: Recent Results (from the past 240 hour(s))  Wet prep, genital     Status: Abnormal   Collection Time: 07/02/14  7:00 PM  Result Value Ref Range Status   Yeast Wet Prep HPF POC NONE SEEN NONE SEEN Final   Trich, Wet Prep NONE SEEN NONE SEEN Final   Clue Cells Wet Prep HPF POC FEW (A) NONE SEEN Final   WBC, Wet Prep HPF POC FEW (A) NONE SEEN Final  Culture, blood (routine x 2)     Status: None  (Preliminary result)   Collection Time: 07/09/14  6:30 PM  Result Value Ref Range Status   Specimen Description BLOOD LEFT ANTECUBITAL  Final   Special Requests   Final    BOTTLES DRAWN AEROBIC AND ANAEROBIC BLUE 10CC RED 5CC   Culture   Final           BLOOD CULTURE RECEIVED NO GROWTH TO DATE CULTURE WILL BE HELD FOR 5 DAYS BEFORE ISSUING A FINAL NEGATIVE REPORT Performed at [facility]    Report Status PENDING  Incomplete  Culture, blood (routine x 2)     Status: None (Preliminary result)   Collection Time: 07/09/14  6:45 PM  Result Value Ref Range Status   Specimen Description BLOOD RIGHT HAND  Final   Special Requests BOTTLES DRAWN AEROBIC AND ANAEROBIC 5ML  Final   Culture   Final           BLOOD CULTURE RECEIVED NO GROWTH TO DATE CULTURE WILL BE HELD FOR 5 DAYS BEFORE ISSUING A FINAL NEGATIVE REPORT Performed at [facility]    Report Status PENDING  Incomplete    RADIOLOGY STUDIES/RESULTS: Ct Head Wo Contrast  07/06/2014   CLINICAL DATA:  H/o depression, renal disorder, hypertension, hypothyroid, 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 She reports that she fell in the bathroom and woke up on the floor the next morning. She does not remember why she fell, she denies drinking alcohol or using drugs. She reports having lesions to the side of her neck and face after the fall. She complains of pain over her right neck. She reports feeling like her hand cannot straighten out. She denies recent illness. She does not take any medications currently for herpes. She does report some vaginal discharge for 3 weeks. The patient denies fevers, cough, wheezing, shortness of breath, vaginal lesions, vaginal leading, dysuria, hematuria, urinary frequency, urinary urgency, or difficulty walking  EXAM: CT HEAD WITHOUT CONTRAST  CT  CERVICAL  SPINE WITHOUT CONTRAST  TECHNIQUE: Multidetector CT imaging of the head and cervical spine was performed following the standard protocol without intravenous contrast. Multiplanar CT image reconstructions of the cervical spine were also generated.  COMPARISON:  06/03/2011  FINDINGS: CT HEAD FINDINGS  Ventricles are normal in size and configuration. No parenchymal masses or mass effect. No evidence of an infarct. There are no extra-axial masses or abnormal fluid collections.  No intracranial hemorrhage.  No skull fracture. Visualized sinuses and mastoid air cells are clear.  There is low attenuation within the right superior posterior paraspinal musculature with some overlying subcutaneous edema.  CT CERVICAL SPINE FINDINGS  There is low-attenuation mild thickening of the right posterior paraspinal muscles as well as a right medial trapezius. This may be due to muscle strain. Subcutaneous edema is noted along the sub posterior neck soft tissues, greater on the right as well as the right side of the face and neck extending to the upper chest, again greater on the right.  No soft tissue mass, hematoma or adenopathy.  There is no evidence of fracture. No spondylolisthesis. There is moderate loss disc height at C5-C6. Endplate spurring is noted at this level. No evidence of a disc herniation. Neural foramina are well preserved.  IMPRESSION: 1. No intracranial abnormality.  No skull fracture. 2. Abnormal low attenuation throughout the right posterior paraspinal musculature. There is also subcutaneous edema which is most evident along the right posterior lateral neck right face extending to the right upper chest. The low-attenuation in the right posterior paraspinal musculature is likely muscle edema from a strain, but is nonspecific. 3. No cervical fracture or spondylolisthesis.   Electronically Signed   By: Lajean Manes M.D.   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 She reports that she fell in the bathroom and woke up on the floor the next morning. She does not remember why she fell, she denies drinking alcohol or using drugs. She reports having lesions to the side of her neck and face after the fall. She complains of pain over her right neck. She reports feeling like her hand cannot straighten out. She denies recent illness. She does not take any medications currently for herpes. She does report some vaginal discharge for 3 weeks. The patient denies fevers, cough, wheezing, shortness of breath, vaginal lesions, vaginal leading, dysuria, hematuria, urinary frequency, urinary urgency, or difficulty walking  EXAM: CT HEAD WITHOUT CONTRAST  CT CERVICAL SPINE WITHOUT CONTRAST  TECHNIQUE: Multidetector CT imaging of the head and cervical spine was performed following the standard protocol without intravenous contrast. Multiplanar CT image reconstructions of the cervical spine were also generated.  COMPARISON:  06/03/2011  FINDINGS: CT HEAD FINDINGS  Ventricles are normal in size and configuration. No parenchymal masses or mass effect. No evidence of an infarct. There are no extra-axial masses or abnormal fluid collections.  No intracranial hemorrhage.  No skull fracture. Visualized sinuses and mastoid air cells are clear.  There is low attenuation within the right superior posterior paraspinal musculature with some overlying subcutaneous edema.  CT CERVICAL SPINE FINDINGS  There is low-attenuation mild thickening of the right posterior paraspinal muscles as well as a right medial trapezius. This  may be due to muscle strain. Subcutaneous edema is noted along the sub posterior neck soft tissues, greater on the right as well as  the right side of the face and neck extending to the upper chest, again greater on the right.  No soft tissue mass, hematoma or adenopathy.  There is no evidence of fracture. No spondylolisthesis. There is moderate loss disc height at C5-C6. Endplate spurring is noted at this level. No evidence of a disc herniation. Neural foramina are well preserved.  IMPRESSION: 1. No intracranial abnormality.  No skull fracture. 2. Abnormal low attenuation throughout the right posterior paraspinal musculature. There is also subcutaneous edema which is most evident along the right posterior lateral neck right face extending to the right upper chest. The low-attenuation in the right posterior paraspinal musculature is likely muscle edema from a strain, but is nonspecific. 3. No cervical fracture or spondylolisthesis.   Electronically Signed   By: Lajean Manes M.D.   On: 07/06/2014 15:14   Mr Brain Wo Contrast  07/02/2014   CLINICAL DATA:  [place] 4 days ago and unconscious for 12 hours. Now with neck pain. Left radial nerve palsy  EXAM: MRI HEAD WITHOUT CONTRAST  TECHNIQUE: Multiplanar, multiecho pulse sequences of the brain and surrounding structures were obtained without intravenous contrast.  COMPARISON:  CT head 06/03/2011  FINDINGS: Cerebellar tonsils extend 8 mm below the foramen magnum compatible with mild Chiari malformation. Negative for hydrocephalus. Pituitary normal in size.  Negative for acute infarct.  Negative for chronic ischemia.  Negative for demyelinating disease. Cerebral white matter normal. Brainstem and basal ganglia normal  Negative for intracranial hemorrhage or fluid collection.  Negative for mass or edema.  No shift of the midline structures.  Mild mucosal edema paranasal sinuses  There is extensive edema in the erector spinae muscles of the right posterior neck below the occiput. This is unilateral and involves all of the muscles. This may reach related to acute muscle injury from the patient's fall or  subsequent positioning while unconscious. This may lead to rhabdomyolysis.  IMPRESSION: Mild Chiari malformation.  No acute intracranial abnormality.  Extensive edema in the erector spinae muscles in the right neck, worrisome for acute muscle injury. This may lead to rhabdomyolysis.   Electronically Signed   By: Franchot Gallo M.D.   On: 07/02/2014 20:58   Mr Cervical Spine Wo Contrast  07/09/2014   CLINICAL DATA:  44 year old female with cervical muscle pain. Rhabdomyolysis suspected following loss of consciousness for 12 hours. Subsequent encounter.  EXAM: MRI CERVICAL SPINE WITHOUT CONTRAST  TECHNIQUE: Multiplanar, multisequence MR imaging of the cervical spine was performed. No intravenous contrast was administered.  COMPARISON:  Cervical spine MRI 07/02/2014.  FINDINGS: Continued diffuse abnormal superficial right neck and deep paraspinal muscle signal and enlargement, best seen today on coronal STIR image 5 of series 600.  Very mild or minimal abnormal signal in the left erector spinae muscles (image 5), but this has progressed since the prior study (series 500 image 10).  Increased prevertebral or retropharyngeal effusion extending from the C2 level to the C6 level. Also in this region, there is asymmetric signal and enlargement throughout the left longus coli muscle (series 8, image 18) although the right remains normal. This is similar to the prior study.  Cervical spine bone marrow signal remains normal aside from degenerative appearing anterior endplate edema at C3-J6. Intervening disc space loss, and chronic circumferential disc osteophyte complex at that level. Bone marrow signal at the skullbase appears within  normal limits.  Cervicomedullary junction is within normal limits. There is degenerative spinal stenosis at C5-C6, stable with mild spinal cord mass effect but no cord signal abnormality identified. Mild cervical spine degeneration elsewhere.  Major vascular flow voids in the neck are  preserved there is subcutaneous edema in the region of the mandible. The visible parapharyngeal spaces appear remain normal. The sublingual space remains normal.  IMPRESSION: 1. Persistent and severe abnormal signal throughout the muscle groups of the right neck, from the trapezius to the deep erector spinae muscles. No intramuscular fluid collection or abscess identified. 2. Much more mild but increased abnormal signal in the left erector spinae muscles. 3. Increased retropharyngeal effusion. There is also abnormal signal in the left longus coli muscle similar to that seen in #1, but the right longus coli is normal. 4. Chronic disc and endplate degeneration at C5-C6 with degenerative spinal stenosis. Stable spinal cord mass effect with no cord signal abnormality.   Electronically Signed   By: Genevie Ann M.D.   On: 07/09/2014 16:47   Mr Cervical Spine Wo Contrast  07/02/2014   CLINICAL DATA:  Fall 4 days ago. Unconscious 12 hours. Neck pain. Left radial nerve palsy.  EXAM: MRI CERVICAL SPINE WITHOUT CONTRAST  TECHNIQUE: Multiplanar, multisequence MR imaging of the cervical spine was performed. No intravenous contrast was administered.  COMPARISON:  MRI of the brain today  FINDINGS: Image quality is diminished due to difficulty positioning the patient in the coil.  Extensive edema in the erector spinae muscles on the right below the occiput and extending into the upper thoracic spine. The lower extent of this abnormality is not imaged. There is some edema in the medial trapezius muscle on the right. No well-defined fluid collection or hematoma is identified. The muscle bundles are enlarged.  Negative for fracture. No bone marrow edema identified. No mass lesion.  Mild Chiari malformation.  Cervical spine normal alignment  C2-3:  Negative  C3-4: Mild uncinate spurring on the left and mild left facet hypertrophy causing left foraminal narrowing. No cord deformity  C4-5: Mild disc and facet degeneration and spurring.  Mild left foraminal narrowing  C5-6: Moderately large central disc protrusion and associated osteophyte with a chronic appearance. There is moderate spinal stenosis with cord flattening. No cord signal abnormality.  C6-7:  Mild degenerative change  C7-T1:  Negative  IMPRESSION: Extensive edema throughout the right erector spinae muscles extending from the occiput to the upper thoracic spine and right medial trapezius muscle. No focal hematoma. Left-sided muscles are normal. No bone marrow edema. Findings are most likely due to muscle edema and possibly muscle necrosis. No evidence of hematoma or abscess.  Negative for fracture  Mild Chiari malformation  Chronic central disc protrusion and spurring at C5-6 with moderate spinal stenosis. No cord injury at this level.  These results were called by telephone at the time of interpretation on 07/02/2014 at 9:22 pm to Dr. Winfred Leeds, who verbally acknowledged these results.   Electronically Signed   By: Franchot Gallo M.D.   On: 07/02/2014 21:24   US Renal  07/03/2014   CLINICAL DATA:  Acute renal failure  EXAM: RENAL/URINARY TRACT ULTRASOUND COMPLETE  COMPARISON:  None.  FINDINGS: Right Kidney:  Length: 12.7 cm. Echogenicity within normal limits. No mass or hydronephrosis visualized.  Left Kidney:  Length: 13.5 cm. Echogenicity within normal limits. No mass or hydronephrosis visualized.  Bladder:  The bladder is decompressed around a Foley catheter and cannot be evaluated.  IMPRESSION: Negative right  nephrosis.  No significant abnormality.   Electronically Signed   By: Andreas Newport M.D.   On: 07/03/2014 21:07   US Abdomen Limited Ruq  07/05/2014   CLINICAL DATA:  Initial evaluation elevated liver function enzymes  EXAM: US ABDOMEN LIMITED - RIGHT UPPER QUADRANT  COMPARISON:  None.  FINDINGS: Gallbladder:  No gallstones or wall thickening visualized. No sonographic Murphy sign noted.  Common bile duct:  Diameter: 3 mm  Liver:  No focal lesion identified. Within  normal limits in parenchymal echogenicity.  IMPRESSION: Normal right upper quadrant ultrasound   Electronically Signed   By: Skipper Cliche M.D.   On: 07/05/2014 09:04    Oren Binet, MD  [facility] Pager:[phone]  If 7PM-7AM, please contact night-coverage [email or web address] Password [email or web address] 07/11/2014, 12:18 PM   LOS: 9 days

## 2014-07-11 NOTE — Progress Notes (Signed)
Chaplain initiated visit with pt. Pt reports desire to go home, but also desire to recover fully before doing so. Chaplain and pt explored feelings and began to speak about coping strategies. Pt needed to work with physical therapy and the time so chaplain excused herself. Page chaplain as needed.    07/11/14 1200  Clinical Encounter Type  Visited With Patient  Visit Type Initial;Spiritual support  Spiritual Encounters  Spiritual Needs Emotional;Prayer  Stress Factors  Patient Stress Factors Major life changes  Leib Elahi, Barbette Hair, Chaplain 07/11/2014 12:16 PM

## 2014-07-11 NOTE — Progress Notes (Signed)
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  Pt fell at night and came to next morning on bathroom floor.  Did  not come to hospital until 4 days later.  Pt has weakness in L UE and significant swelling in LUE as well as neck pain.      PT Comments    Pt continues to demonstrate scissoring and staggering L and R during ambulation.  Pt reports stiffness when performing L elbow flexion AROM w/ decreased stiffness after 10 reps.    Follow Up Recommendations  Home health PT;Supervision/Assistance - 24 hour     Equipment Recommendations  Rolling walker with 5" wheels    Recommendations for Other Services       Precautions / Restrictions Precautions Precautions: Fall Precaution Comments: Pt is unaware of her imbalance while ambulating Restrictions Weight Bearing Restrictions: No    Mobility  Bed Mobility Overal bed mobility: Modified Independent             General bed mobility comments: increased time  Transfers Overall transfer level: Needs assistance Equipment used: Rolling walker (2 wheeled) Transfers: Sit to/from Stand Sit to Stand: Supervision         General transfer comment: supervision for safety  Ambulation/Gait Ambulation/Gait assistance: Min guard Ambulation Distance (Feet): 300 Feet Assistive device: Rolling walker (2 wheeled) Gait Pattern/deviations: Step-through pattern;Staggering left;Staggering right;Scissoring     General Gait Details: v/c's to take turns slowly as pt has slight LOB when turning quickly, pt demonstrated understanding w/o LOB. Pt staggering to R and L but able to maintain balance using RW.    Stairs            Wheelchair Mobility    Modified Rankin (Stroke Patients Only)       Balance Overall  balance assessment: Needs assistance Sitting-balance support: No upper extremity supported;Feet supported Sitting balance-Leahy Scale: Good     Standing balance support: Bilateral upper extremity supported Standing balance-Leahy Scale: Poor Standing balance comment: Pt w/ LOB while ambulating during turns.  Pt would not be safe to walk w/o RW.                    Cognition Arousal/Alertness: Awake/alert Behavior During Therapy: WFL for tasks assessed/performed Overall Cognitive Status: Within Functional Limits for tasks assessed                      Exercises General Exercises - Upper Extremity Shoulder Flexion: AROM;Both;10 reps;Seated Elbow Flexion: Both;AROM;10 reps;Seated General Exercises - Lower Extremity Long Arc Quad: AROM;Both;10 reps;Seated    General Comments        Pertinent Vitals/Pain Pain Assessment: 0-10 Pain Score: 9  Pain Location: neck and L arm Pain Descriptors / Indicators: Aching;Tightness Pain Intervention(s): Limited activity within patient's tolerance;Monitored during session;Repositioned    Home Living                      Prior Function            PT Goals (current goals can now be found in the care plan section) Acute Rehab PT Goals Patient Stated Goal: to go home Progress towards PT goals: Progressing toward goals    Frequency  Min 3X/week    PT Plan Current plan remains appropriate  Co-evaluation             End of Session Equipment Utilized During Treatment: Gait belt Activity Tolerance: Patient tolerated treatment well Patient left: in bed;with call bell/phone within reach;with nursing/sitter in room     Time: 1210-1236 PT Time Calculation (min) (ACUTE ONLY): 26 min  Charges:  $Gait Training: 8-22 mins $Therapeutic Exercise: 8-22 mins                    G CodesJoslyn Hy PT, [place] [phone] 07/11/2014, 2:01 PM

## 2014-07-12 DIAGNOSIS — F329 Major depressive disorder, single episode, unspecified: Secondary | ICD-10-CM

## 2014-07-12 LAB — BASIC METABOLIC PANEL
ANION GAP: 8 (ref 5–15)
BUN: 27 mg/dL — ABNORMAL HIGH (ref 6–23)
CO2: 30 mmol/L (ref 19–32)
Calcium: 8.4 mg/dL (ref 8.4–10.5)
Chloride: 101 mmol/L (ref 96–112)
Creatinine, Ser: 2.8 mg/dL — ABNORMAL HIGH (ref 0.50–1.10)
GFR calc non Af Amer: 20 mL/min — ABNORMAL LOW (ref >90)
GFR, EST AFRICAN AMERICAN: 23 mL/min — AB (ref >90)
Glucose, Bld: 113 mg/dL — ABNORMAL HIGH (ref 70–99)
POTASSIUM: 4 mmol/L (ref 3.5–5.1)
Sodium: 139 mmol/L (ref 135–145)

## 2014-07-12 LAB — CK: CK TOTAL: 1414 U/L — AB (ref 7–177)

## 2014-07-12 MED ORDER — POLYETHYLENE GLYCOL 3350 17 G PO PACK
17.0000 g | PACK | Freq: Two times a day (BID) | ORAL | Status: DC
  Administered 2014-07-12: 17 g via ORAL
  Filled 2014-07-12: qty 1

## 2014-07-12 MED ORDER — BISACODYL 10 MG RE SUPP: 10.0000 mg | Freq: Every day | RECTAL | Status: DC | PRN

## 2014-07-12 MED ORDER — OXYCODONE HCL 5 MG PO TABS
10.0000 mg | ORAL_TABLET | ORAL | Status: DC | PRN
  Administered 2014-07-12 – 2014-07-13 (×7): 15 mg via ORAL
  Filled 2014-07-12 (×7): qty 3

## 2014-07-12 MED ORDER — LACTULOSE 10 GM/15ML PO SOLN
30.0000 g | Freq: Once | ORAL | Status: AC
  Administered 2014-07-12: 30 g via ORAL
  Filled 2014-07-12: qty 45

## 2014-07-12 NOTE — Progress Notes (Signed)
PATIENT DETAILS Name: Jill Hopkins Age: 44 y.o. Sex: female Date of Birth: 1971/01/07 Admit Date: 07/02/2014 Admitting Physician Theressa Millard, MD [facility]  Subjective: No major events overnight-neck pain continues.   Assessment/Plan: Principal Problem:   Acute renal failure: Secondary to rhabdomyolysis. Admitted and started on IV fluids and nephrology consulted. Creatinine now slowly down trending to 2.80 with just supportive measures.Follow electrolytes,  Active Problems:   Rhabdomyolysis:Likely secondary to muscle injury from a fall/being down in the bathroom for at least 12 hours a few days prior to this admission. She was hydrated, CK has now slowly started to trend down. Continue to follow CK    Swelling of neck muscles: Suspect this is secondary to compression/injury when she was on the floor a few days prior to admission. Repeat MRI on 3/23 essentially unchanged and continues swelling of her muscles without any evidence of abscess or hematoma. Spoke with ENT-Dr. Wilburn Cornelia on 12/23, he did not have any further recommendations apart from talking with the neurosurgeon. Subsequently spoke with Dr. Saintclair Halsted on 12/23 who personally reviewed the MRI C-spine, and had no further recommendations apart from continuing supportive care, checking blood cultures. I also spoke with Dr. Hall-radiologist on 3/24, most part that this was most consistent with rhabdomyolysis. Her vascular structures in the MRI appeared normal, recent bilateral upper extremity Doppler was negative for DVT. Her autoimmune panel including dsDNA was negative. ESR slightly elevated at 30. Have started trial of steroids on 3/24, no significant change yet-if not much better over the next few days-then will stop    Syncope: This occurred a few days prior to admission. MRI of the brain was negative. 2-D echocardiogram negative as well. Telemetry was negative.EEG was negative. MRI brain neg for  CVA. A urine drug screen was positive for cocaine (denies use). Following closely monitor for now.    Hypertension: Controlled. Continue with metoprolol.    Hypothyroidism: Continue with levothyroxine-but since TSH elevated-increased to 150 mcg. Repeat TSH in 3 months    Bacterial vaginosis: Continue Flagyl currently on day 7 of 7 treatment. Stop after last dose today    Left forearm/arm swelling: Doppler negative. Likely secondary to edema/compression from fall. Follow and monitor closely-slowly decreasing in size.    Elevated liver enzymes: Suspect secondary to rhabdomyolysis. LFT's have normalized.    Constipation:secondary to narcotics.One dose of lactulose today, place on Miralax    Hyponatremia: Resolved    Hypokalemia: Resolved    Suspected drug abuse: Urine drug screen positive for cocaine and opiates, Patient vehemently denies. However polysubstance use-could explain the syncopal episode causing her to pass out for almost 12 hours.      History of depression: Continue with nortriptyline, Abilify.  Disposition: Remain inpatient-home ?tomorrow  Antibiotics:  See below   Anti-infectives    Start     Dose/Rate Route Frequency Ordered Stop   07/04/14 2200  metroNIDAZOLE (FLAGYL) tablet 500 mg     500 mg Oral Every 12 hours 07/04/14 1714        DVT Prophylaxis:  Prophylactic Heparin   Code Status: Full code   Family Communication Boyfriend sleeping in the room-spoke with patient-after she gave persmission  Procedures:  None  CONSULTS:  nephrology   QRF:[id number] over the phone 3/23  Neurosurgery Dr Saintclair Halsted: over the phone 3/23  Radiology-Dr Nevada Crane over the phone 3/24   MEDICATIONS: Scheduled Meds: . ARIPiprazole  2 mg Oral Daily  . buPROPion  150  mg Oral BID  . ferrous sulfate  325 mg Oral Daily  . gabapentin  300 mg Oral BID  . heparin subcutaneous  5,000 Units Subcutaneous 3 times per day  . lactulose  30 g Oral Once  . levothyroxine  150 mcg Oral QAC  breakfast  . metoprolol succinate  50 mg Oral Daily  . metroNIDAZOLE  500 mg Oral Q12H  . nortriptyline  25 mg Oral QHS  . pantoprazole  40 mg Oral QHS  . polyethylene glycol  17 g Oral BID  . predniSONE  40 mg Oral Q breakfast   Continuous Infusions:  PRN Meds:.acetaminophen **OR** acetaminophen, cyclobenzaprine, HYDROmorphone (DILAUDID) injection, ondansetron **OR** ondansetron (ZOFRAN) IV, oxyCODONE, zolpidem    PHYSICAL EXAM: Vital signs in last 24 hours: Filed Vitals:   07/11/14 0601 07/11/14 0852 07/11/14 1619 07/11/14 2036  BP: 126/82 126/82 122/66 158/95  Pulse: 78 78 98 100  Temp: 98.3 F (36.8 C)  98.6 F (37 C) 98.5 F (36.9 C)  TempSrc:   Oral Axillary  Resp: 16  18   Height:      Weight:      SpO2: 98%  100% 98%    Weight change:  Filed Weights   07/03/14 0121  Weight: 83.008 kg (183 lb)   Body mass index is 31.4 kg/(m^2).   Gen Exam: Awake and alert with clear speech. Lower right facial swelling in the right mandibular area. Neck: Mostly right posterior neck swelling evident. Neck is supple. Patient able to move neck left more than right laterally.  Chest: B/L Clear.  No rales CVS: S1 S2 Regular, no murmurs.  Abdomen: soft, BS +, non tender, non distended.  Lower Extremities: no edema, lower extremities warm to touch. Mild swelling of her left upper extremity (improving slowly per patient) Neurologic: Non Focal.   Skin: No Rash.   Wounds: N/A.  Intake/Output from previous day:  Intake/Output Summary (Last 24 hours) at 07/12/14 0919 Last data filed at 07/11/14 1500  Gross per 24 hour  Intake    260 ml  Output      0 ml  Net    260 ml     LAB RESULTS: CBC  Recent Labs Lab 07/06/14 0652 07/07/14 0730 07/09/14 0739 07/10/14 0628 07/11/14 0523  WBC 6.3 5.4 4.6 5.6 7.3  HGB 10.8* 9.9* 8.6* 8.9* 8.6*  HCT 32.5* 29.8* 26.1* 27.0* 25.6*  PLT 286 259 251 276 274  MCV 93.4 94.0 93.2 94.4 93.4  MCH 31.0 31.2 30.7 31.1 31.4  MCHC 33.2 33.2  33.0 33.0 33.6  RDW 13.6 13.7 13.6 13.8 13.6  LYMPHSABS 1.1  --   --   --   --   MONOABS 0.5  --   --   --   --   EOSABS 0.4  --   --   --   --   BASOSABS 0.0  --   --   --   --     Chemistries   Recent Labs Lab 07/08/14 0536 07/09/14 0739 07/10/14 0628 07/11/14 0523 07/12/14 0640  NA 134* 135 135 134* 139  K 3.4* 3.1* 3.9 4.5 4.0  CL 89* 96 98 97 101  CO2 36* 28 28 23 30   GLUCOSE 138* 111* 100* 173* 113*  BUN 28* 25* 27* 25* 27*  CREATININE 5.18* 4.30* 3.75* 3.13* 2.80*  CALCIUM 7.6* 8.0* 7.8* 8.2* 8.4    CBG: No results for input(s): GLUCAP in the last 168 hours.  GFR Estimated Creatinine  Clearance: 27.5 mL/min (by C-G formula based on Cr of 2.8).  Coagulation profile No results for input(s): INR, PROTIME in the last 168 hours.  Cardiac Enzymes No results for input(s): CKMB, TROPONINI, MYOGLOBIN in the last 168 hours.  Invalid input(s): CK  Invalid input(s): POCBNP No results for input(s): DDIMER in the last 72 hours. No results for input(s): HGBA1C in the last 72 hours. No results for input(s): CHOL, HDL, LDLCALC, TRIG, CHOLHDL, LDLDIRECT in the last 72 hours. No results for input(s): TSH, T4TOTAL, T3FREE, THYROIDAB in the last 72 hours.  Invalid input(s): FREET3 No results for input(s): VITAMINB12, FOLATE, FERRITIN, TIBC, IRON, RETICCTPCT in the last 72 hours. No results for input(s): LIPASE, AMYLASE in the last 72 hours.  Urine Studies No results for input(s): UHGB, CRYS in the last 72 hours.  Invalid input(s): UACOL, UAPR, USPG, UPH, UTP, UGL, UKET, UBIL, UNIT, UROB, ULEU, UEPI, UWBC, URBC, UBAC, CAST, UCOM, BILUA  MICROBIOLOGY: Recent Results (from the past 240 hour(s))  Wet prep, genital     Status: Abnormal   Collection Time: 07/02/14  7:00 PM  Result Value Ref Range Status   Yeast Wet Prep HPF POC NONE SEEN NONE SEEN Final   Trich, Wet Prep NONE SEEN NONE SEEN Final   Clue Cells Wet Prep HPF POC FEW (A) NONE SEEN Final   WBC, Wet Prep HPF POC  FEW (A) NONE SEEN Final  Culture, blood (routine x 2)     Status: None (Preliminary result)   Collection Time: 07/09/14  6:30 PM  Result Value Ref Range Status   Specimen Description BLOOD LEFT ANTECUBITAL  Final   Special Requests   Final    BOTTLES DRAWN AEROBIC AND ANAEROBIC BLUE 10CC RED 5CC   Culture   Final           BLOOD CULTURE RECEIVED NO GROWTH TO DATE CULTURE WILL BE HELD FOR 5 DAYS BEFORE ISSUING A FINAL NEGATIVE REPORT Performed at [facility]    Report Status PENDING  Incomplete  Culture, blood (routine x 2)     Status: None (Preliminary result)   Collection Time: 07/09/14  6:45 PM  Result Value Ref Range Status   Specimen Description BLOOD RIGHT HAND  Final   Special Requests BOTTLES DRAWN AEROBIC AND ANAEROBIC 5ML  Final   Culture   Final           BLOOD CULTURE RECEIVED NO GROWTH TO DATE CULTURE WILL BE HELD FOR 5 DAYS BEFORE ISSUING A FINAL NEGATIVE REPORT Performed at [facility]    Report Status PENDING  Incomplete    RADIOLOGY STUDIES/RESULTS: Ct Head Wo Contrast  07/06/2014   CLINICAL DATA:  H/o depression, renal disorder, hypertension, hypothyroid, 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 She reports that she fell in the bathroom and woke up on the floor the next morning. She does not remember why she fell, she denies drinking alcohol or using drugs. She reports having lesions to the side of her neck and face after the fall. She complains of pain over her right neck. She reports feeling like her hand cannot straighten out. She denies recent illness. She does not take any medications currently for herpes. She does report some vaginal discharge for 3 weeks. The patient denies fevers, cough, wheezing, shortness of breath, vaginal lesions,  vaginal leading, dysuria, hematuria, urinary frequency,  urinary urgency, or difficulty walking  EXAM: CT HEAD WITHOUT CONTRAST  CT CERVICAL SPINE WITHOUT CONTRAST  TECHNIQUE: Multidetector CT imaging of the head and cervical spine was performed following the standard protocol without intravenous contrast. Multiplanar CT image reconstructions of the cervical spine were also generated.  COMPARISON:  06/03/2011  FINDINGS: CT HEAD FINDINGS  Ventricles are normal in size and configuration. No parenchymal masses or mass effect. No evidence of an infarct. There are no extra-axial masses or abnormal fluid collections.  No intracranial hemorrhage.  No skull fracture. Visualized sinuses and mastoid air cells are clear.  There is low attenuation within the right superior posterior paraspinal musculature with some overlying subcutaneous edema.  CT CERVICAL SPINE FINDINGS  There is low-attenuation mild thickening of the right posterior paraspinal muscles as well as a right medial trapezius. This may be due to muscle strain. Subcutaneous edema is noted along the sub posterior neck soft tissues, greater on the right as well as the right side of the face and neck extending to the upper chest, again greater on the right.  No soft tissue mass, hematoma or adenopathy.  There is no evidence of fracture. No spondylolisthesis. There is moderate loss disc height at C5-C6. Endplate spurring is noted at this level. No evidence of a disc herniation. Neural foramina are well preserved.  IMPRESSION: 1. No intracranial abnormality.  No skull fracture. 2. Abnormal low attenuation throughout the right posterior paraspinal musculature. There is also subcutaneous edema which is most evident along the right posterior lateral neck right face extending to the right upper chest. The low-attenuation in the right posterior paraspinal musculature is likely muscle edema from a strain, but is nonspecific. 3. No cervical fracture or spondylolisthesis.   Electronically Signed   By: Lajean Manes M.D.   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 She reports that she fell in the bathroom and woke up on the floor the next morning. She does not remember why she fell, she denies drinking alcohol or using drugs. She reports having lesions to the side of her neck and face after the fall. She complains of pain over her right neck. She reports feeling like her hand cannot straighten out. She denies recent illness. She does not take any medications currently for herpes. She does report some vaginal discharge for 3 weeks. The patient denies fevers, cough, wheezing, shortness of breath, vaginal lesions, vaginal leading, dysuria, hematuria, urinary frequency, urinary urgency, or difficulty walking  EXAM: CT HEAD WITHOUT CONTRAST  CT CERVICAL SPINE WITHOUT CONTRAST  TECHNIQUE: Multidetector CT imaging of the head and cervical spine was performed following the standard protocol without intravenous contrast. Multiplanar CT image reconstructions of the cervical spine were also generated.  COMPARISON:  06/03/2011  FINDINGS: CT HEAD FINDINGS  Ventricles are normal in size and configuration. No parenchymal masses or mass effect. No evidence of an infarct. There are no extra-axial masses or abnormal fluid collections.  No intracranial hemorrhage.  No skull fracture. Visualized sinuses and mastoid air cells are clear.  There is low attenuation within the right superior posterior paraspinal musculature with some overlying subcutaneous edema.  CT CERVICAL SPINE  FINDINGS  There is low-attenuation mild thickening of the right posterior paraspinal muscles as well as a right medial trapezius. This may be due to muscle strain. Subcutaneous edema is  noted along the sub posterior neck soft tissues, greater on the right as well as the right side of the face and neck extending to the upper chest, again greater on the right.  No soft tissue mass, hematoma or adenopathy.  There is no evidence of fracture. No spondylolisthesis. There is moderate loss disc height at C5-C6. Endplate spurring is noted at this level. No evidence of a disc herniation. Neural foramina are well preserved.  IMPRESSION: 1. No intracranial abnormality.  No skull fracture. 2. Abnormal low attenuation throughout the right posterior paraspinal musculature. There is also subcutaneous edema which is most evident along the right posterior lateral neck right face extending to the right upper chest. The low-attenuation in the right posterior paraspinal musculature is likely muscle edema from a strain, but is nonspecific. 3. No cervical fracture or spondylolisthesis.   Electronically Signed   By: Lajean Manes M.D.   On: 07/06/2014 15:14   Mr Brain Wo Contrast  07/02/2014   CLINICAL DATA:  [place] 4 days ago and unconscious for 12 hours. Now with neck pain. Left radial nerve palsy  EXAM: MRI HEAD WITHOUT CONTRAST  TECHNIQUE: Multiplanar, multiecho pulse sequences of the brain and surrounding structures were obtained without intravenous contrast.  COMPARISON:  CT head 06/03/2011  FINDINGS: Cerebellar tonsils extend 8 mm below the foramen magnum compatible with mild Chiari malformation. Negative for hydrocephalus. Pituitary normal in size.  Negative for acute infarct.  Negative for chronic ischemia.  Negative for demyelinating disease. Cerebral white matter normal. Brainstem and basal ganglia normal  Negative for intracranial hemorrhage or fluid collection.  Negative for mass or edema.  No shift of the midline structures.  Mild mucosal edema paranasal sinuses  There is extensive edema in the erector spinae muscles of the right posterior neck below the occiput. This is unilateral and involves all of the  muscles. This may reach related to acute muscle injury from the patient's fall or subsequent positioning while unconscious. This may lead to rhabdomyolysis.  IMPRESSION: Mild Chiari malformation.  No acute intracranial abnormality.  Extensive edema in the erector spinae muscles in the right neck, worrisome for acute muscle injury. This may lead to rhabdomyolysis.   Electronically Signed   By: Franchot Gallo M.D.   On: 07/02/2014 20:58   Mr Cervical Spine Wo Contrast  07/09/2014   CLINICAL DATA:  44 year old female with cervical muscle pain. Rhabdomyolysis suspected following loss of consciousness for 12 hours. Subsequent encounter.  EXAM: MRI CERVICAL SPINE WITHOUT CONTRAST  TECHNIQUE: Multiplanar, multisequence MR imaging of the cervical spine was performed. No intravenous contrast was administered.  COMPARISON:  Cervical spine MRI 07/02/2014.  FINDINGS: Continued diffuse abnormal superficial right neck and deep paraspinal muscle signal and enlargement, best seen today on coronal STIR image 5 of series 600.  Very mild or minimal abnormal signal in the left erector spinae muscles (image 5), but this has progressed since the prior study (series 500 image 10).  Increased prevertebral or retropharyngeal effusion extending from the C2 level to the C6 level. Also in this region, there is asymmetric signal and enlargement throughout the left longus coli muscle (series 8, image 18) although the right remains normal. This is similar to the prior study.  Cervical spine bone marrow signal remains normal aside from degenerative appearing anterior endplate edema at R5-J8.  Intervening disc space loss, and chronic circumferential disc osteophyte complex at that level. Bone marrow signal at the skullbase appears within normal limits.  Cervicomedullary junction is within normal limits. There is degenerative spinal stenosis at C5-C6, stable with mild spinal cord mass effect but no cord signal abnormality identified. Mild cervical  spine degeneration elsewhere.  Major vascular flow voids in the neck are preserved there is subcutaneous edema in the region of the mandible. The visible parapharyngeal spaces appear remain normal. The sublingual space remains normal.  IMPRESSION: 1. Persistent and severe abnormal signal throughout the muscle groups of the right neck, from the trapezius to the deep erector spinae muscles. No intramuscular fluid collection or abscess identified. 2. Much more mild but increased abnormal signal in the left erector spinae muscles. 3. Increased retropharyngeal effusion. There is also abnormal signal in the left longus coli muscle similar to that seen in #1, but the right longus coli is normal. 4. Chronic disc and endplate degeneration at C5-C6 with degenerative spinal stenosis. Stable spinal cord mass effect with no cord signal abnormality.   Electronically Signed   By: Genevie Ann M.D.   On: 07/09/2014 16:47   Mr Cervical Spine Wo Contrast  07/02/2014   CLINICAL DATA:  Fall 4 days ago. Unconscious 12 hours. Neck pain. Left radial nerve palsy.  EXAM: MRI CERVICAL SPINE WITHOUT CONTRAST  TECHNIQUE: Multiplanar, multisequence MR imaging of the cervical spine was performed. No intravenous contrast was administered.  COMPARISON:  MRI of the brain today  FINDINGS: Image quality is diminished due to difficulty positioning the patient in the coil.  Extensive edema in the erector spinae muscles on the right below the occiput and extending into the upper thoracic spine. The lower extent of this abnormality is not imaged. There is some edema in the medial trapezius muscle on the right. No well-defined fluid collection or hematoma is identified. The muscle bundles are enlarged.  Negative for fracture. No bone marrow edema identified. No mass lesion.  Mild Chiari malformation.  Cervical spine normal alignment  C2-3:  Negative  C3-4: Mild uncinate spurring on the left and mild left facet hypertrophy causing left foraminal narrowing.  No cord deformity  C4-5: Mild disc and facet degeneration and spurring. Mild left foraminal narrowing  C5-6: Moderately large central disc protrusion and associated osteophyte with a chronic appearance. There is moderate spinal stenosis with cord flattening. No cord signal abnormality.  C6-7:  Mild degenerative change  C7-T1:  Negative  IMPRESSION: Extensive edema throughout the right erector spinae muscles extending from the occiput to the upper thoracic spine and right medial trapezius muscle. No focal hematoma. Left-sided muscles are normal. No bone marrow edema. Findings are most likely due to muscle edema and possibly muscle necrosis. No evidence of hematoma or abscess.  Negative for fracture  Mild Chiari malformation  Chronic central disc protrusion and spurring at C5-6 with moderate spinal stenosis. No cord injury at this level.  These results were called by telephone at the time of interpretation on 07/02/2014 at 9:22 pm to Dr. Winfred Leeds, who verbally acknowledged these results.   Electronically Signed   By: Franchot Gallo M.D.   On: 07/02/2014 21:24   US Renal  07/03/2014   CLINICAL DATA:  Acute renal failure  EXAM: RENAL/URINARY TRACT ULTRASOUND COMPLETE  COMPARISON:  None.  FINDINGS: Right Kidney:  Length: 12.7 cm. Echogenicity within normal limits. No mass or hydronephrosis visualized.  Left Kidney:  Length: 13.5 cm. Echogenicity within normal limits. No mass or  hydronephrosis visualized.  Bladder:  The bladder is decompressed around a Foley catheter and cannot be evaluated.  IMPRESSION: Negative right nephrosis.  No significant abnormality.   Electronically Signed   By: Andreas Newport M.D.   On: 07/03/2014 21:07   US Abdomen Limited Ruq  07/05/2014   CLINICAL DATA:  Initial evaluation elevated liver function enzymes  EXAM: US ABDOMEN LIMITED - RIGHT UPPER QUADRANT  COMPARISON:  None.  FINDINGS: Gallbladder:  No gallstones or wall thickening visualized. No sonographic Murphy sign noted.  Common  bile duct:  Diameter: 3 mm  Liver:  No focal lesion identified. Within normal limits in parenchymal echogenicity.  IMPRESSION: Normal right upper quadrant ultrasound   Electronically Signed   By: Skipper Cliche M.D.   On: 07/05/2014 09:04    Oren Binet, MD  [facility] Pager:[phone]  If 7PM-7AM, please contact night-coverage [email or web address] Password [facility] 07/12/2014, 9:19 AM   LOS: 10 days

## 2014-07-13 LAB — CK: CK TOTAL: 965 U/L — AB (ref 7–177)

## 2014-07-13 LAB — BASIC METABOLIC PANEL
ANION GAP: 10 (ref 5–15)
BUN: 24 mg/dL — AB (ref 6–23)
CO2: 28 mmol/L (ref 19–32)
Calcium: 8.5 mg/dL (ref 8.4–10.5)
Chloride: 99 mmol/L (ref 96–112)
Creatinine, Ser: 2.21 mg/dL — ABNORMAL HIGH (ref 0.50–1.10)
GFR calc Af Amer: 31 mL/min — ABNORMAL LOW (ref >90)
GFR calc non Af Amer: 26 mL/min — ABNORMAL LOW (ref >90)
GLUCOSE: 114 mg/dL — AB (ref 70–99)
POTASSIUM: 4.1 mmol/L (ref 3.5–5.1)
SODIUM: 137 mmol/L (ref 135–145)

## 2014-07-13 MED ORDER — POLYETHYLENE GLYCOL 3350 17 G PO PACK: 17.0000 g | PACK | Freq: Two times a day (BID) | ORAL | Status: DC

## 2014-07-13 MED ORDER — LEVOTHYROXINE SODIUM 150 MCG PO TABS: 150.0000 ug | ORAL_TABLET | Freq: Every day | ORAL | Status: DC

## 2014-07-13 MED ORDER — OXYCODONE HCL 10 MG PO TABS: 10.0000 mg | ORAL_TABLET | ORAL | Status: DC | PRN

## 2014-07-13 MED ORDER — CYCLOBENZAPRINE HCL 5 MG PO TABS: 5.0000 mg | ORAL_TABLET | Freq: Three times a day (TID) | ORAL | Status: DC | PRN

## 2014-07-13 MED ORDER — PREDNISONE 10 MG PO TABS: ORAL_TABLET | ORAL | Status: DC

## 2014-07-13 NOTE — Discharge Summary (Signed)
PATIENT DETAILS Name: Jill Hopkins Age: 44 y.o. Sex: female Date of Birth: 05-29-1970 MRN: [id number]. Admitting Physician: Theressa Millard, MD [id number]  Admit Date: 07/02/2014 Discharge date: 07/13/2014  Recommendations for Outpatient Follow-up:  1. Please follow renal function and creatinine kinase till levels have normalized. Rapidly down trending. 2. Please follow swelling in the right neck area and left hand-stable and actually decreasing slowly at time of discharge. 3. Have discontinued HCTZ/losartan and statin temporarily until renal function has normalized and CK levels have normalized. Please reassess and resume accordingly 4. TSH levels were elevated, have increased levothyroxine to 150 mcg, please repeat TSH in 3 months  PRIMARY DISCHARGE DIAGNOSIS:  Principal Problem:   Acute kidney failure Active Problems:   Essential hypertension, benign   Rhabdomyolysis   Syncope and collapse   Skin eruption   Thyroid disease   Depression   Neck pain      PAST MEDICAL HISTORY: Past Medical History  Diagnosis Date  . Hypertension   . Thyroid disease   . Depression   . Renal disorder     DISCHARGE MEDICATIONS: Current Discharge Medication List    START taking these medications   Details  cyclobenzaprine (FLEXERIL) 5 MG tablet Take 1 tablet (5 mg total) by mouth 3 (three) times daily as needed for muscle spasms. Qty: 30 tablet, Refills: 0    oxyCODONE 10 MG TABS Take 1-1.5 tablets (10-15 mg total) by mouth every 4 (four) hours as needed for moderate pain. Qty: 40 tablet, Refills: 0    polyethylene glycol (MIRALAX / GLYCOLAX) packet Take 17 g by mouth 2 (two) times daily. Qty: 14 each, Refills: 0    predniSONE (DELTASONE) 10 MG tablet Take 30 mg (3 tablets) for 2 days, then, Take 20 mg (2 tablets) for 2 days, then, Take 10 mg (one tablet) for one day and then discontinue Qty: 11 tablet, Refills: 0      CONTINUE these medications  which have CHANGED   Details  levothyroxine (SYNTHROID, LEVOTHROID) 150 MCG tablet Take 1 tablet (150 mcg total) by mouth daily. Qty: 30 tablet, Refills: 0      CONTINUE these medications which have NOT CHANGED   Details  ABILIFY 2 MG tablet Take 2 mg by mouth daily. Refills: 5    amLODipine (NORVASC) 10 MG tablet Take 10 mg by mouth daily. Refills: 5    buPROPion (WELLBUTRIN SR) 150 MG 12 hr tablet Take 150 mg by mouth 2 (two) times daily.    ferrous sulfate 325 (65 FE) MG EC tablet Take 325 mg by mouth daily.     gabapentin (NEURONTIN) 300 MG capsule Take 300 mg by mouth 2 (two) times daily.    metoprolol succinate (TOPROL-XL) 100 MG 24 hr tablet Take 100 mg by mouth daily. Take with or immediately following a meal.    nortriptyline (PAMELOR) 25 MG capsule Take 25 mg by mouth at bedtime.    NUVARING 0.12-0.015 MG/24HR vaginal ring USE AS DIRECTED Qty: 1 each, Refills: 2    clotrimazole (LOTRIMIN) 1 % cream Apply 1 application topically 2 (two) times daily. Qty: 60 g, Refills: 0   Associated Diagnoses: Candidal skin infection      STOP taking these medications     ketoprofen (ORUDIS) 75 MG capsule      losartan-hydrochlorothiazide (HYZAAR) 100-25 MG per tablet      rosuvastatin (CRESTOR) 20 MG tablet      zolpidem (AMBIEN CR) 12.5 MG CR tablet  ALLERGIES:   Allergies  Allergen Reactions  . Ibuprofen Nausea And Vomiting    BRIEF HPI:  See H&P, Labs, Consult and Test reports for all details in brief, patient is a 44 year old female with history of hypertension, hypothyroidism, depression who presented to the hospital with generalized neck, but mostly in the right knee and left arm area. She apparently had a? Syncopal episode 3 days prior to this admission while in her bathroom, and woke up approximately 12 hours later not knowing exactly what happened. Over the course of the next few days she started developing swelling in the right neck, generalized pain.  She subsequently presented to the emergency room, was found to have severe rhabdomyolysis and acute renal failure. She was then admitted for first evaluation and treatment  CONSULTATIONS:   nephrology  PERTINENT RADIOLOGIC STUDIES: Ct Head Wo Contrast  07/06/2014   CLINICAL DATA:  H/o depression, renal disorder, hypertension, hypothyroid, 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 She reports that she fell in the bathroom and woke up on the floor the next morning. She does not remember why she fell, she denies drinking alcohol or using drugs. She reports having lesions to the side of her neck and face after the fall. She complains of pain over her right neck. She reports feeling like her hand cannot straighten out. She denies recent illness. She does not take any medications currently for herpes. She does report some vaginal discharge for 3 weeks. The patient denies fevers, cough, wheezing, shortness of breath, vaginal lesions, vaginal leading, dysuria, hematuria, urinary frequency, urinary urgency, or difficulty walking  EXAM: CT HEAD WITHOUT CONTRAST  CT CERVICAL SPINE WITHOUT CONTRAST  TECHNIQUE: Multidetector CT imaging of the head and cervical spine was performed following the standard protocol without intravenous contrast. Multiplanar CT image reconstructions of the cervical spine were also generated.  COMPARISON:  06/03/2011  FINDINGS: CT HEAD FINDINGS  Ventricles are normal in size and configuration. No parenchymal masses or mass effect. No evidence of an infarct. There are no extra-axial masses or abnormal fluid collections.  No intracranial hemorrhage.  No skull fracture. Visualized sinuses and mastoid air cells are clear.  There is low attenuation within the right superior posterior paraspinal musculature with some overlying subcutaneous edema.   CT CERVICAL SPINE FINDINGS  There is low-attenuation mild thickening of the right posterior paraspinal muscles as well as a right medial trapezius. This may be due to muscle strain. Subcutaneous edema is noted along the sub posterior neck soft tissues, greater on the right as well as the right side of the face and neck extending to the upper chest, again greater on the right.  No soft tissue mass, hematoma or adenopathy.  There is no evidence of fracture. No spondylolisthesis. There is moderate loss disc height at C5-C6. Endplate spurring is noted at this level. No evidence of a disc herniation. Neural foramina are well preserved.  IMPRESSION: 1. No intracranial abnormality.  No skull fracture. 2. Abnormal low attenuation throughout the right posterior paraspinal musculature. There is also subcutaneous edema which is most evident along the right posterior lateral neck right face extending to the right upper chest. The low-attenuation in the right posterior paraspinal musculature is likely muscle edema from a strain, but is nonspecific. 3. No cervical fracture or spondylolisthesis.   Electronically Signed  By: Lajean Manes M.D.   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 She reports that she fell in the bathroom and woke up on the floor the next morning. She does not remember why she fell, she denies drinking alcohol or using drugs. She reports having lesions to the side of her neck and face after the fall. She complains of pain over her right neck. She reports feeling like her hand cannot straighten out. She denies recent illness. She does not take any medications currently for herpes. She does report some vaginal  discharge for 3 weeks. The patient denies fevers, cough, wheezing, shortness of breath, vaginal lesions, vaginal leading, dysuria, hematuria, urinary frequency, urinary urgency, or difficulty walking  EXAM: CT HEAD WITHOUT CONTRAST  CT CERVICAL SPINE WITHOUT CONTRAST  TECHNIQUE: Multidetector CT imaging of the head and cervical spine was performed following the standard protocol without intravenous contrast. Multiplanar CT image reconstructions of the cervical spine were also generated.  COMPARISON:  06/03/2011  FINDINGS: CT HEAD FINDINGS  Ventricles are normal in size and configuration. No parenchymal masses or mass effect. No evidence of an infarct. There are no extra-axial masses or abnormal fluid collections.  No intracranial hemorrhage.  No skull fracture. Visualized sinuses and mastoid air cells are clear.  There is low attenuation within the right superior posterior paraspinal musculature with some overlying subcutaneous edema.  CT CERVICAL SPINE FINDINGS  There is low-attenuation mild thickening of the right posterior paraspinal muscles as well as a right medial trapezius. This may be due to muscle strain. Subcutaneous edema is noted along the sub posterior neck soft tissues, greater on the right as well as the right side of the face and neck extending to the upper chest, again greater on the right.  No soft tissue mass, hematoma or adenopathy.  There is no evidence of fracture. No spondylolisthesis. There is moderate loss disc height at C5-C6. Endplate spurring is noted at this level. No evidence of a disc herniation. Neural foramina are well preserved.  IMPRESSION: 1. No intracranial abnormality.  No skull fracture. 2. Abnormal low attenuation throughout the right posterior paraspinal musculature. There is also subcutaneous edema which is most evident along the right posterior lateral neck right face extending to the right upper chest. The low-attenuation in the right posterior paraspinal musculature is  likely muscle edema from a strain, but is nonspecific. 3. No cervical fracture or spondylolisthesis.   Electronically Signed   By: Lajean Manes M.D.   On: 07/06/2014 15:14   Mr Brain Wo Contrast  07/02/2014   CLINICAL DATA:  [place] 4 days ago and unconscious for 12 hours. Now with neck pain. Left radial nerve palsy  EXAM: MRI HEAD WITHOUT CONTRAST  TECHNIQUE: Multiplanar, multiecho pulse sequences of the brain and surrounding structures were obtained without intravenous contrast.  COMPARISON:  CT head 06/03/2011  FINDINGS: Cerebellar tonsils extend 8 mm below the foramen magnum compatible with mild Chiari malformation. Negative for hydrocephalus. Pituitary normal in size.  Negative for acute infarct.  Negative for chronic ischemia.  Negative for demyelinating disease. Cerebral white matter normal. Brainstem and basal ganglia normal  Negative for intracranial hemorrhage or fluid collection.  Negative for mass  or edema.  No shift of the midline structures.  Mild mucosal edema paranasal sinuses  There is extensive edema in the erector spinae muscles of the right posterior neck below the occiput. This is unilateral and involves all of the muscles. This may reach related to acute muscle injury from the patient's fall or subsequent positioning while unconscious. This may lead to rhabdomyolysis.  IMPRESSION: Mild Chiari malformation.  No acute intracranial abnormality.  Extensive edema in the erector spinae muscles in the right neck, worrisome for acute muscle injury. This may lead to rhabdomyolysis.   Electronically Signed   By: Franchot Gallo M.D.   On: 07/02/2014 20:58   Mr Cervical Spine Wo Contrast  07/09/2014   CLINICAL DATA:  44 year old female with cervical muscle pain. Rhabdomyolysis suspected following loss of consciousness for 12 hours. Subsequent encounter.  EXAM: MRI CERVICAL SPINE WITHOUT CONTRAST  TECHNIQUE: Multiplanar, multisequence MR imaging of the cervical spine was performed. No intravenous  contrast was administered.  COMPARISON:  Cervical spine MRI 07/02/2014.  FINDINGS: Continued diffuse abnormal superficial right neck and deep paraspinal muscle signal and enlargement, best seen today on coronal STIR image 5 of series 600.  Very mild or minimal abnormal signal in the left erector spinae muscles (image 5), but this has progressed since the prior study (series 500 image 10).  Increased prevertebral or retropharyngeal effusion extending from the C2 level to the C6 level. Also in this region, there is asymmetric signal and enlargement throughout the left longus coli muscle (series 8, image 18) although the right remains normal. This is similar to the prior study.  Cervical spine bone marrow signal remains normal aside from degenerative appearing anterior endplate edema at R8-X0. Intervening disc space loss, and chronic circumferential disc osteophyte complex at that level. Bone marrow signal at the skullbase appears within normal limits.  Cervicomedullary junction is within normal limits. There is degenerative spinal stenosis at C5-C6, stable with mild spinal cord mass effect but no cord signal abnormality identified. Mild cervical spine degeneration elsewhere.  Major vascular flow voids in the neck are preserved there is subcutaneous edema in the region of the mandible. The visible parapharyngeal spaces appear remain normal. The sublingual space remains normal.  IMPRESSION: 1. Persistent and severe abnormal signal throughout the muscle groups of the right neck, from the trapezius to the deep erector spinae muscles. No intramuscular fluid collection or abscess identified. 2. Much more mild but increased abnormal signal in the left erector spinae muscles. 3. Increased retropharyngeal effusion. There is also abnormal signal in the left longus coli muscle similar to that seen in #1, but the right longus coli is normal. 4. Chronic disc and endplate degeneration at C5-C6 with degenerative spinal stenosis.  Stable spinal cord mass effect with no cord signal abnormality.   Electronically Signed   By: Genevie Ann M.D.   On: 07/09/2014 16:47   Mr Cervical Spine Wo Contrast  07/02/2014   CLINICAL DATA:  Fall 4 days ago. Unconscious 12 hours. Neck pain. Left radial nerve palsy.  EXAM: MRI CERVICAL SPINE WITHOUT CONTRAST  TECHNIQUE: Multiplanar, multisequence MR imaging of the cervical spine was performed. No intravenous contrast was administered.  COMPARISON:  MRI of the brain today  FINDINGS: Image quality is diminished due to difficulty positioning the patient in the coil.  Extensive edema in the erector spinae muscles on the right below the occiput and extending into the upper thoracic spine. The lower extent of this abnormality is not imaged. There is some edema  in the medial trapezius muscle on the right. No well-defined fluid collection or hematoma is identified. The muscle bundles are enlarged.  Negative for fracture. No bone marrow edema identified. No mass lesion.  Mild Chiari malformation.  Cervical spine normal alignment  C2-3:  Negative  C3-4: Mild uncinate spurring on the left and mild left facet hypertrophy causing left foraminal narrowing. No cord deformity  C4-5: Mild disc and facet degeneration and spurring. Mild left foraminal narrowing  C5-6: Moderately large central disc protrusion and associated osteophyte with a chronic appearance. There is moderate spinal stenosis with cord flattening. No cord signal abnormality.  C6-7:  Mild degenerative change  C7-T1:  Negative  IMPRESSION: Extensive edema throughout the right erector spinae muscles extending from the occiput to the upper thoracic spine and right medial trapezius muscle. No focal hematoma. Left-sided muscles are normal. No bone marrow edema. Findings are most likely due to muscle edema and possibly muscle necrosis. No evidence of hematoma or abscess.  Negative for fracture  Mild Chiari malformation  Chronic central disc protrusion and spurring at  C5-6 with moderate spinal stenosis. No cord injury at this level.  These results were called by telephone at the time of interpretation on 07/02/2014 at 9:22 pm to Dr. Winfred Leeds, who verbally acknowledged these results.   Electronically Signed   By: Franchot Gallo M.D.   On: 07/02/2014 21:24   US Renal  07/03/2014   CLINICAL DATA:  Acute renal failure  EXAM: RENAL/URINARY TRACT ULTRASOUND COMPLETE  COMPARISON:  None.  FINDINGS: Right Kidney:  Length: 12.7 cm. Echogenicity within normal limits. No mass or hydronephrosis visualized.  Left Kidney:  Length: 13.5 cm. Echogenicity within normal limits. No mass or hydronephrosis visualized.  Bladder:  The bladder is decompressed around a Foley catheter and cannot be evaluated.  IMPRESSION: Negative right nephrosis.  No significant abnormality.   Electronically Signed   By: Andreas Newport M.D.   On: 07/03/2014 21:07   US Abdomen Limited Ruq  07/05/2014   CLINICAL DATA:  Initial evaluation elevated liver function enzymes  EXAM: US ABDOMEN LIMITED - RIGHT UPPER QUADRANT  COMPARISON:  None.  FINDINGS: Gallbladder:  No gallstones or wall thickening visualized. No sonographic Murphy sign noted.  Common bile duct:  Diameter: 3 mm  Liver:  No focal lesion identified. Within normal limits in parenchymal echogenicity.  IMPRESSION: Normal right upper quadrant ultrasound   Electronically Signed   By: Skipper Cliche M.D.   On: 07/05/2014 09:04     PERTINENT LAB RESULTS: CBC:  Recent Labs  07/11/14 0523  WBC 7.3  HGB 8.6*  HCT 25.6*  PLT 274   CMET CMP     Component Value Date/Time   NA 137 07/13/2014 0915   K 4.1 07/13/2014 0915   CL 99 07/13/2014 0915   CO2 28 07/13/2014 0915   GLUCOSE 114* 07/13/2014 0915   BUN 24* 07/13/2014 0915   CREATININE 2.21* 07/13/2014 0915   CALCIUM 8.5 07/13/2014 0915   PROT 6.7 07/11/2014 0523   ALBUMIN 3.2* 07/11/2014 0523   AST 65* 07/11/2014 0523   ALT 45* 07/11/2014 0523   ALKPHOS 37* 07/11/2014 0523   BILITOT  0.7 07/11/2014 0523   GFRNONAA 26* 07/13/2014 0915   GFRAA 31* 07/13/2014 0915    GFR Estimated Creatinine Clearance: 34.9 mL/min (by C-G formula based on Cr of 2.21). No results for input(s): LIPASE, AMYLASE in the last 72 hours.  Recent Labs  07/11/14 0523 07/12/14 0640 07/13/14 0915  CKTOTAL 2138*  [place] input(s): POCBNP No results for input(s): DDIMER in the last 72 hours. No results for input(s): HGBA1C in the last 72 hours. No results for input(s): CHOL, HDL, LDLCALC, TRIG, CHOLHDL, LDLDIRECT in the last 72 hours. No results for input(s): TSH, T4TOTAL, T3FREE, THYROIDAB in the last 72 hours.  Invalid input(s): FREET3 No results for input(s): VITAMINB12, FOLATE, FERRITIN, TIBC, IRON, RETICCTPCT in the last 72 hours. Coags: No results for input(s): INR in the last 72 hours.  Invalid input(s): PT Microbiology: Recent Results (from the past 240 hour(s))  Culture, blood (routine x 2)     Status: None (Preliminary result)   Collection Time: 07/09/14  6:30 PM  Result Value Ref Range Status   Specimen Description BLOOD LEFT ANTECUBITAL  Final   Special Requests   Final    BOTTLES DRAWN AEROBIC AND ANAEROBIC BLUE 10CC RED 5CC   Culture   Final           BLOOD CULTURE RECEIVED NO GROWTH TO DATE CULTURE WILL BE HELD FOR 5 DAYS BEFORE ISSUING A FINAL NEGATIVE REPORT Performed at [facility]    Report Status PENDING  Incomplete  Culture, blood (routine x 2)     Status: None (Preliminary result)   Collection Time: 07/09/14  6:45 PM  Result Value Ref Range Status   Specimen Description BLOOD RIGHT HAND  Final   Special Requests BOTTLES DRAWN AEROBIC AND ANAEROBIC 5ML  Final   Culture   Final           BLOOD CULTURE RECEIVED NO GROWTH TO DATE CULTURE WILL BE HELD FOR 5 DAYS BEFORE ISSUING A FINAL NEGATIVE REPORT Performed at [facility]    Report Status PENDING  Incomplete     BRIEF HOSPITAL COURSE:  Acute renal failure: Secondary to  rhabdomyolysis. Admitted and started on IV fluids and nephrology consulted. Creatinine now slowly down trending to 2.21 with just supportive measures.Follow electrolytes periodically as an outpatient. Avoid losartan HCTZ daily ARF has completely resolved  Active Problems:  Rhabdomyolysis:Likely secondary to muscle injury from a fall/being down in the bathroom for at least 12 hours a few days prior to this admission. She was hydrated, CK has now slowly started to trend down-to 965 at time of discharge. Continue to follow CK periodically as an outpatient. Have discontinued statin till CK normalizes   Swelling of right neck muscles: Suspect this is secondary to compression/injury when she was on the floor a few days prior to admission. Initial MRI on presentation showed significant swelling of her muscles in the neck mostly on the right side. Repeat MR neck I on 3/23 essentially unchanged and continues swelling of her muscles without any evidence of abscess or hematoma. Spoke with ENT-Dr. Wilburn Cornelia on 12/23, he did not have any further recommendations apart from talking with the neurosurgeon. Subsequently spoke with Dr. Saintclair Halsted on 12/23 who personally reviewed the MRI C-spine, and had no further recommendations apart from continuing supportive care, checking blood cultures which was negative. I also spoke with Dr. Hall-radiologist on 3/24, most likely this was most consistent with rhabdomyolysis. Her vascular structures in the MRI appeared normal, recent bilateral upper extremity Doppler was negative for DVT. Her autoimmune panel including dsDNA was negative. ESR slightly elevated at 30. Have started trial of steroids on 3/24, with doubt significant change-but do feel that swelling has gotten over the past few days. We will rapidly taper prednisone discharge-see above medication list. I have asked patient to make  an appointment with her primary care practitioner within next few days for a quick hospital follow-up  and also for CK and renal function panel. She continues to have neck pain, initially she was on IV Dilaudid, she has now been slowly transitioned to oral oxycodone which is controlling her pain fairly well   Syncope: This occurred a few days prior to admission. MRI of the brain was negative. 2-D echocardiogram negative as well. Telemetry was negative.EEG was negative. MRI brain neg for CVA. A urine drug screen was positive for cocaine (denies use). Following closely monitor for now.   Hypertension: Controlled. Continue with metoprolol and amlodipine. Continue to hold losartan HCTZ until renal function completely normalizes.   Hypothyroidism: Continue with levothyroxine-but since TSH elevated-increased to 150 mcg. Repeat TSH in 3 months   Bacterial vaginosis: Completed a 7 day course of Flagyl.    Left forearm/arm swelling: Doppler negative. Likely secondary to edema/compression from fall. Follow and monitor closely as outpatient-continues to slowly decreasing in size.   Elevated liver enzymes: Suspect secondary to rhabdomyolysis. LFT's have normalized.   Constipation:secondary to narcotics.will be placed on MiraLAX on discharge   Hyponatremia: Resolved   Hypokalemia: Resolved   Suspected drug abuse: Urine drug screen positive for cocaine and opiates, Patient vehemently denies. However polysubstance use-could explain the syncopal episode causing her to pass out for almost 12 hours.    History of depression: Continue with nortriptyline, Abilify.   TODAY-DAY OF DISCHARGE:  Subjective:   Jill Hopkins today has no headache,no chest abdominal pain,no new weakness tingling or numbness, feels much better wants to go home today. She continues to have intermittent neck pain that is controlled with oral narcotics.  Objective:   Blood pressure 141/93, pulse 88, temperature 98.1 F (36.7 C), temperature source Oral, resp. rate 18, height 5' 4" (1.626 m), weight 83.008 kg (183 lb),  last menstrual period 06/03/2014, SpO2 99 %.  Intake/Output Summary (Last 24 hours) at 07/13/14 1039 Last data filed at 07/13/14 0900  Gross per 24 hour  Intake   1850 ml  Output      0 ml  Net   1850 ml   Filed Weights   07/03/14 0121  Weight: 83.008 kg (183 lb)    Exam Awake Alert, Oriented *3, No new F.N deficits, Normal affect [place].AT,PERRAL Supple Neck,No JVD, No cervical lymphadenopathy appriciated.  Symmetrical Chest wall movement, Good air movement bilaterally, CTAB RRR,No Gallops,Rubs or new Murmurs, No Parasternal Heave +ve B.Sounds, Abd Soft, Non tender, No organomegaly appriciated, No rebound -guarding or rigidity. No Cyanosis, Clubbing or edema, No new Rash or bruise  DISCHARGE CONDITION: Stable  DISPOSITION: Home with home health services  DISCHARGE INSTRUCTIONS:    Activity:  As tolerated with Full fall precautions use walker/cane & assistance as needed  Diet recommendation: Heart Healthy diet  Discharge Instructions    Call MD for:  persistant nausea and vomiting    Complete by:  As directed      Call MD for:  severe uncontrolled pain    Complete by:  As directed      Call MD for:    Complete by:  As directed   Right neck swelling worsens dramatically. If you develops fever     Diet - low sodium heart healthy    Complete by:  As directed      Increase activity slowly    Complete by:  As directed            Follow-up Information  Follow up with [facility]. Schedule an appointment as soon as possible for a visit in 2 days.   Why:  Please ask your primary care practitioner to check kidney function and CPK levels at next visit   Contact information:   [place] [address] [phone]       Total Time spent on discharge equals 45 minutes.  SignedOren Binet 07/13/2014 10:39 AM

## 2014-07-13 NOTE — Care Management Note (Signed)
    Page 1 of 1   07/13/2014     1:23:43 PM CARE MANAGEMENT NOTE 07/13/2014  Patient:  Jill Hopkins, Jill Hopkins   Account Number:  [id number]  Date Initiated:  07/13/2014  Documentation initiated by:  Mariane Masters  Subjective/Objective Assessment:   adm: [facility] in [place] and Woke up Next Day     Action/Plan:   discharg planning   Anticipated DC Date:  07/13/2014   Anticipated DC Plan:  [place]  CM consult      Choice offered to / List presented to:          [facility] arranged  HH-2 PT      Status of service:  Completed, signed off Medicare Important Message given?   (If response is "NO", the following Medicare IM given date fields will be blank) Date Medicare IM given:   Medicare IM given by:   Date Additional Medicare IM given:   Additional Medicare IM given by:    Discharge Disposition:  HOME/SELF CARE  Per UR Regulation:    If discussed at Long Length of Stay Meetings, dates discussed:    Comments:  07/13/14 13:00 Cm received call from RN to please arrange HHPT.  Cm called [facility] and pt does not meet criteria to cover HHPT with insurance.  CM made RN aware and made MD aware. Cm spoke with pt who does not want to pay out-of-pocket. No other CM needs were communicated.  Mariane Masters, BSN, CM [phone].

## 2014-07-13 NOTE — Progress Notes (Signed)
Discharge instructions and prescriptions reviewed, patient received her copy of instructions and Rx's. Saline lock removed, cannula intact. Patient transported with belongings, instructions and Rx's via w/c by nursing tech to [place] front entrance to meet her mother who will drive her home.

## 2014-07-16 LAB — CULTURE, BLOOD (ROUTINE X 2)
Culture: NO GROWTH
Culture: NO GROWTH

## 2014-07-18 ENCOUNTER — Emergency Department (HOSPITAL_COMMUNITY): Payer: Medicaid Other

## 2014-07-18 ENCOUNTER — Encounter (HOSPITAL_COMMUNITY): Payer: Self-pay | Admitting: Vascular Surgery

## 2014-07-18 ENCOUNTER — Emergency Department (HOSPITAL_COMMUNITY)
Admission: EM | Admit: 2014-07-18 | Discharge: 2014-07-19 | Disposition: A | Discharge status: Home / Self Care | Payer: Medicaid Other | Attending: Emergency Medicine | Admitting: Emergency Medicine

## 2014-07-18 DIAGNOSIS — I10 Essential (primary) hypertension: Secondary | ICD-10-CM | POA: Diagnosis not present

## 2014-07-18 DIAGNOSIS — M542 Cervicalgia: Secondary | ICD-10-CM | POA: Diagnosis present

## 2014-07-18 DIAGNOSIS — N289 Disorder of kidney and ureter, unspecified: Secondary | ICD-10-CM | POA: Insufficient documentation

## 2014-07-18 DIAGNOSIS — R51 Headache: Secondary | ICD-10-CM | POA: Insufficient documentation

## 2014-07-18 DIAGNOSIS — R519 Headache, unspecified: Secondary | ICD-10-CM

## 2014-07-18 DIAGNOSIS — F329 Major depressive disorder, single episode, unspecified: Secondary | ICD-10-CM | POA: Insufficient documentation

## 2014-07-18 DIAGNOSIS — E079 Disorder of thyroid, unspecified: Secondary | ICD-10-CM | POA: Diagnosis not present

## 2014-07-18 DIAGNOSIS — R202 Paresthesia of skin: Secondary | ICD-10-CM | POA: Diagnosis not present

## 2014-07-18 DIAGNOSIS — D649 Anemia, unspecified: Secondary | ICD-10-CM | POA: Diagnosis not present

## 2014-07-18 DIAGNOSIS — Z87891 Personal history of nicotine dependence: Secondary | ICD-10-CM | POA: Insufficient documentation

## 2014-07-18 DIAGNOSIS — Z79899 Other long term (current) drug therapy: Secondary | ICD-10-CM | POA: Diagnosis not present

## 2014-07-18 LAB — URINE MICROSCOPIC-ADD ON

## 2014-07-18 LAB — COMPREHENSIVE METABOLIC PANEL
ALT: 18 U/L (ref 0–35)
AST: 20 U/L (ref 0–37)
Albumin: 3.7 g/dL (ref 3.5–5.2)
Alkaline Phosphatase: 39 U/L (ref 39–117)
Anion gap: 6 (ref 5–15)
BILIRUBIN TOTAL: 0.1 mg/dL — AB (ref 0.3–1.2)
BUN: 23 mg/dL (ref 6–23)
CHLORIDE: 99 mmol/L (ref 96–112)
CO2: 30 mmol/L (ref 19–32)
CREATININE: 1.73 mg/dL — AB (ref 0.50–1.10)
Calcium: 8.9 mg/dL (ref 8.4–10.5)
GFR calc Af Amer: 41 mL/min — ABNORMAL LOW (ref >90)
GFR, EST NON AFRICAN AMERICAN: 36 mL/min — AB (ref >90)
Glucose, Bld: 89 mg/dL (ref 70–99)
Potassium: 3.4 mmol/L — ABNORMAL LOW (ref 3.5–5.1)
SODIUM: 135 mmol/L (ref 135–145)
Total Protein: 7.1 g/dL (ref 6.0–8.3)

## 2014-07-18 LAB — CBC WITH DIFFERENTIAL/PLATELET
Basophils Absolute: 0 10*3/uL (ref 0.0–0.1)
Basophils Relative: 0 % (ref 0–1)
EOS ABS: 0.2 10*3/uL (ref 0.0–0.7)
Eosinophils Relative: 3 % (ref 0–5)
HCT: 29.1 % — ABNORMAL LOW (ref 36.0–46.0)
Hemoglobin: 9.6 g/dL — ABNORMAL LOW (ref 12.0–15.0)
Lymphocytes Relative: 26 % (ref 12–46)
Lymphs Abs: 2 10*3/uL (ref 0.7–4.0)
MCH: 30.9 pg (ref 26.0–34.0)
MCHC: 33 g/dL (ref 30.0–36.0)
MCV: 93.6 fL (ref 78.0–100.0)
Monocytes Absolute: 0.6 10*3/uL (ref 0.1–1.0)
Monocytes Relative: 7 % (ref 3–12)
NEUTROS PCT: 64 % (ref 43–77)
Neutro Abs: 4.9 10*3/uL (ref 1.7–7.7)
PLATELETS: 452 10*3/uL — AB (ref 150–400)
RBC: 3.11 MIL/uL — ABNORMAL LOW (ref 3.87–5.11)
RDW: 14.4 % (ref 11.5–15.5)
WBC: 7.7 10*3/uL (ref 4.0–10.5)

## 2014-07-18 LAB — URINALYSIS, ROUTINE W REFLEX MICROSCOPIC
Bilirubin Urine: NEGATIVE
GLUCOSE, UA: NEGATIVE mg/dL
KETONES UR: NEGATIVE mg/dL
Leukocytes, UA: NEGATIVE
Nitrite: NEGATIVE
PROTEIN: NEGATIVE mg/dL
Specific Gravity, Urine: 1.024 (ref 1.005–1.030)
UROBILINOGEN UA: 1 mg/dL (ref 0.0–1.0)
pH: 6 (ref 5.0–8.0)

## 2014-07-18 LAB — CK: Total CK: 137 U/L (ref 7–177)

## 2014-07-18 NOTE — ED Notes (Signed)
Pt reports to the ED for eval of neck pain. Pt reports she fell and laid on the ground for a day. She was admitted for rhabdomyolysis. She was d/c 3 days ago. She reports that she continues to have neck pain and right sided head and facial numbness. Pt also reports her urine is dark. Pt reports she has been using the heat packs, flexaril, prednisone, and oxycodone but she continues to have symptoms. Pt A&Ox4, resp e/u, and skin warm and dry.

## 2014-07-18 NOTE — ED Provider Notes (Signed)
CSN: [id number]     Arrival date & time 07/18/14  1721 History   First MD Initiated Contact with Patient 07/18/14 2125     Chief Complaint  Patient presents with  . Neck Pain     (Consider location/radiation/quality/duration/timing/severity/associated sxs/prior Treatment) Patient is a 44 y.o. female presenting with neck pain. The history is provided by the patient.  Neck Pain She had recently been admitted to the hospital because of rhabdomyolysis and was discharged 5 days ago. Since going home, she feels she is not improving and is getting worse. She complains that there is swelling in her neck which seems to move from side to side. There is numbness in the right side of her scalp. She is complaining of pain in her neck and upper back as well as in her arms and legs. She rates pain at 8/10. Medication that she has been taking has not been helping. She is worried that she is going to go to sleep and not wake up. She denies fever, chills, sweats. There's been no nausea or vomiting. Her urine has been dark, but she states that she has just started her menses today.  Past Medical History  Diagnosis Date  . Hypertension   . Thyroid disease   . Depression   . Renal disorder    Past Surgical History  Procedure Laterality Date  . Cervical cerclage     No family history on file. History  Substance Use Topics  . Smoking status: Former Smoker    Types: Cigarettes    Quit date: 03/04/2013  . Smokeless tobacco: Never Used  . Alcohol Use: No   OB History    No data available     Review of Systems  Musculoskeletal: Positive for neck pain.  All other systems reviewed and are negative.     Allergies  Ibuprofen  Home Medications   Prior to Admission medications   Medication Sig Start Date End Date Taking? Authorizing Provider  ABILIFY 2 MG tablet Take 2 mg by mouth daily. 04/27/14   Historical Provider, MD  amLODipine (NORVASC) 10 MG tablet Take 10 mg by mouth daily. 04/20/14    Historical Provider, MD  buPROPion (WELLBUTRIN SR) 150 MG 12 hr tablet Take 150 mg by mouth 2 (two) times daily.    Historical Provider, MD  clotrimazole (LOTRIMIN) 1 % cream Apply 1 application topically 2 (two) times daily. 03/18/13   Shelly Bombard, MD  cyclobenzaprine (FLEXERIL) 5 MG tablet Take 1 tablet (5 mg total) by mouth 3 (three) times daily as needed for muscle spasms. 07/13/14   Shanker Kristeen Mans, MD  ferrous sulfate 325 (65 FE) MG EC tablet Take 325 mg by mouth daily.     Historical Provider, MD  gabapentin (NEURONTIN) 300 MG capsule Take 300 mg by mouth 2 (two) times daily.    Historical Provider, MD  levothyroxine (SYNTHROID, LEVOTHROID) 150 MCG tablet Take 1 tablet (150 mcg total) by mouth daily. 07/13/14   Shanker Kristeen Mans, MD  metoprolol succinate (TOPROL-XL) 100 MG 24 hr tablet Take 100 mg by mouth daily. Take with or immediately following a meal.    Historical Provider, MD  nortriptyline (PAMELOR) 25 MG capsule Take 25 mg by mouth at bedtime.    Historical Provider, MD  NUVARING 0.12-0.015 MG/24HR vaginal ring USE AS DIRECTED 05/07/13   Shelly Bombard, MD  oxyCODONE 10 MG TABS Take 1-1.5 tablets (10-15 mg total) by mouth every 4 (four) hours as needed for moderate pain. 07/13/14  Shanker Kristeen Mans, MD  polyethylene glycol (MIRALAX / GLYCOLAX) packet Take 17 g by mouth 2 (two) times daily. 07/13/14   Shanker Kristeen Mans, MD  predniSONE (DELTASONE) 10 MG tablet Take 30 mg (3 tablets) for 2 days, then, Take 20 mg (2 tablets) for 2 days, then, Take 10 mg (one tablet) for one day and then discontinue 07/13/14   Shanker Kristeen Mans, MD   BP 150/110 mmHg  Pulse 98  Temp(Src) 98.5 F (36.9 C) (Oral)  Resp 18  Ht 5\' 4"  (1.626 m)  Wt 190 lb (86.183 kg)  BMI 32.60 kg/m2  SpO2 100%  LMP 07/18/2014 Physical Exam  Nursing note and vitals reviewed.  44 year old female, resting comfortably and in no acute distress. Vital signs are significant for hypertension. Oxygen saturation is 100%,  which is normal. Head is normocephalic and atraumatic. PERRLA, EOMI. Oropharynx is clear. Neck is tender diffusely across the posterior aspect of the neck-both in the midline and along the paracervical muscles. Full passive range of motion is present without adenopathy or JVD. Back is moderately tender across the upper back with tenderness in the paraspinal muscles in this area. There is no tenderness in the midthoracic or lower thoracic spine are in the lumbar spine. There is no CVA tenderness. Lungs are clear without rales, wheezes, or rhonchi. Chest is nontender. Heart has regular rate and rhythm without murmur. Abdomen is soft, flat, nontender without masses or hepatosplenomegaly and peristalsis is normoactive. Extremities have no cyanosis or edema, full range of motion is present. Skin is warm and dry without rash. Neurologic: Mental status is normal, cranial nerves are intact, there are no motor deficits. Sensory exam of the scalp shows a tingling sensation on the right but not true paresthesia.  ED Course  Procedures (including critical care time) Labs Review Results for orders placed or performed during the hospital encounter of 07/18/14  Urinalysis, Routine w reflex microscopic  Result Value Ref Range   Color, Urine AMBER (A) YELLOW   APPearance CLOUDY (A) CLEAR   Specific Gravity, Urine 1.024 1.005 - 1.030   pH 6.0 5.0 - 8.0   Glucose, UA NEGATIVE NEGATIVE mg/dL   Hgb urine dipstick LARGE (A) NEGATIVE   Bilirubin Urine NEGATIVE NEGATIVE   Ketones, ur NEGATIVE NEGATIVE mg/dL   Protein, ur NEGATIVE NEGATIVE mg/dL   Urobilinogen, UA 1.0 0.0 - 1.0 mg/dL   Nitrite NEGATIVE NEGATIVE   Leukocytes, UA NEGATIVE NEGATIVE  Comprehensive metabolic panel  Result Value Ref Range   Sodium 135 135 - 145 mmol/L   Potassium 3.4 (L) 3.5 - 5.1 mmol/L   Chloride 99 96 - 112 mmol/L   CO2 30 19 - 32 mmol/L   Glucose, Bld 89 70 - 99 mg/dL   BUN 23 6 - 23 mg/dL   Creatinine, Ser 1.73 (H) 0.50  - 1.10 mg/dL   Calcium 8.9 8.4 - 10.5 mg/dL   Total Protein 7.1 6.0 - 8.3 g/dL   Albumin 3.7 3.5 - 5.2 g/dL   AST 20 0 - 37 U/L   ALT 18 0 - 35 U/L   Alkaline Phosphatase 39 39 - 117 U/L   Total Bilirubin 0.1 (L) 0.3 - 1.2 mg/dL   GFR calc non Af Amer 36 (L) >90 mL/min   GFR calc Af Amer 41 (L) >90 mL/min   Anion gap 6 5 - 15  CBC with Differential  Result Value Ref Range   WBC 7.7 4.0 - 10.5 K/uL   RBC 3.11 (  L) 3.87 - 5.11 MIL/uL   Hemoglobin 9.6 (L) 12.0 - 15.0 g/dL   HCT 29.1 (L) 36.0 - 46.0 %   MCV 93.6 78.0 - 100.0 fL   MCH 30.9 26.0 - 34.0 pg   MCHC 33.0 30.0 - 36.0 g/dL   RDW 14.4 11.5 - 15.5 %   Platelets 452 (H) 150 - 400 K/uL   Neutrophils Relative % 64 43 - 77 %   Neutro Abs 4.9 1.7 - 7.7 K/uL   Lymphocytes Relative 26 12 - 46 %   Lymphs Abs 2.0 0.7 - 4.0 K/uL   Monocytes Relative 7 3 - 12 %   Monocytes Absolute 0.6 0.1 - 1.0 K/uL   Eosinophils Relative 3 0 - 5 %   Eosinophils Absolute 0.2 0.0 - 0.7 K/uL   Basophils Relative 0 0 - 1 %   Basophils Absolute 0.0 0.0 - 0.1 K/uL  CK  Result Value Ref Range   Total CK 137 7 - 177 U/L  Urine microscopic-add on  Result Value Ref Range   Squamous Epithelial / LPF RARE RARE   WBC, UA 0-2 <3 WBC/hpf   RBC / HPF 7-10 <3 RBC/hpf   Bacteria, UA RARE RARE   Casts HYALINE CASTS (A) NEGATIVE   Imaging Review Ct Head Wo Contrast  07/19/2014   CLINICAL DATA:  Acute onset of neck pain. Status post fall, lying on ground for 1 day. Known rhabdomyolysis. Right-sided head and facial numbness. Initial encounter.  EXAM: CT HEAD WITHOUT CONTRAST  CT CERVICAL SPINE WITHOUT CONTRAST  TECHNIQUE: Multidetector CT imaging of the head and cervical spine was performed following the standard protocol without intravenous contrast. Multiplanar CT image reconstructions of the cervical spine were also generated.  COMPARISON:  MRI of the cervical spine performed 07/09/2014, and CT of the cervical spine performed 07/06/2014  FINDINGS: CT HEAD FINDINGS   There is no evidence of acute infarction, mass lesion, or intra- or extra-axial hemorrhage on CT.  The posterior fossa, including the cerebellum, brainstem and fourth ventricle, is within normal limits. The third and lateral ventricles, and basal ganglia are unremarkable in appearance. The cerebral hemispheres are symmetric in appearance, with normal gray-white differentiation. No mass effect or midline shift is seen.  There is no evidence of fracture; visualized osseous structures are unremarkable in appearance. The visualized portions of the orbits are within normal limits. The paranasal sinuses and mastoid air cells are well-aerated. Diffusely decreased attenuation and mild edema are noted along the musculature of the right side of the base of the skull, likely reflecting the patient's known rhabdomyolysis.  CT CERVICAL SPINE FINDINGS  There is no evidence of fracture or subluxation. Vertebral bodies demonstrate normal height and alignment. Intervertebral disc space narrowing is noted at C5-C6, with scattered anterior and posterior disc osteophyte complexes along the lower cervical spine. Prevertebral soft tissues are within normal limits.  The thyroid gland is unremarkable in appearance, though difficult to fully characterize on this study. The visualized lung apices are clear. Diffusely decreased attenuation and mild edema are seen along the musculature of the right posterior neck, along much of the cervical spine, likely reflecting the patient's known rhabdomyolysis.  IMPRESSION: 1. No evidence of traumatic intracranial injury or fracture. 2. No evidence of fracture or subluxation along the cervical spine. 3. Diffusely decreased attenuation and mild soft tissue edema along the musculature of the right side at the base of skull and right posterior neck, likely reflecting the patient's known rhabdomyolysis. 4. Minimal degenerative change  along the lower cervical spine.   Electronically Signed   By: Garald Balding M.D.   On: 07/19/2014 00:03   Ct Cervical Spine Wo Contrast  07/19/2014   CLINICAL DATA:  Acute onset of neck pain. Status post fall, lying on ground for 1 day. Known rhabdomyolysis. Right-sided head and facial numbness. Initial encounter.  EXAM: CT HEAD WITHOUT CONTRAST  CT CERVICAL SPINE WITHOUT CONTRAST  TECHNIQUE: Multidetector CT imaging of the head and cervical spine was performed following the standard protocol without intravenous contrast. Multiplanar CT image reconstructions of the cervical spine were also generated.  COMPARISON:  MRI of the cervical spine performed 07/09/2014, and CT of the cervical spine performed 07/06/2014  FINDINGS: CT HEAD FINDINGS  There is no evidence of acute infarction, mass lesion, or intra- or extra-axial hemorrhage on CT.  The posterior fossa, including the cerebellum, brainstem and fourth ventricle, is within normal limits. The third and lateral ventricles, and basal ganglia are unremarkable in appearance. The cerebral hemispheres are symmetric in appearance, with normal gray-white differentiation. No mass effect or midline shift is seen.  There is no evidence of fracture; visualized osseous structures are unremarkable in appearance. The visualized portions of the orbits are within normal limits. The paranasal sinuses and mastoid air cells are well-aerated. Diffusely decreased attenuation and mild edema are noted along the musculature of the right side of the base of the skull, likely reflecting the patient's known rhabdomyolysis.  CT CERVICAL SPINE FINDINGS  There is no evidence of fracture or subluxation. Vertebral bodies demonstrate normal height and alignment. Intervertebral disc space narrowing is noted at C5-C6, with scattered anterior and posterior disc osteophyte complexes along the lower cervical spine. Prevertebral soft tissues are within normal limits.  The thyroid gland is unremarkable in appearance, though difficult to fully characterize on this study. The  visualized lung apices are clear. Diffusely decreased attenuation and mild edema are seen along the musculature of the right posterior neck, along much of the cervical spine, likely reflecting the patient's known rhabdomyolysis.  IMPRESSION: 1. No evidence of traumatic intracranial injury or fracture. 2. No evidence of fracture or subluxation along the cervical spine. 3. Diffusely decreased attenuation and mild soft tissue edema along the musculature of the right side at the base of skull and right posterior neck, likely reflecting the patient's known rhabdomyolysis. 4. Minimal degenerative change along the lower cervical spine.   Electronically Signed   By: Garald Balding M.D.   On: 07/19/2014 00:03     MDM   Final diagnoses:  Headache  Paresthesias  Renal insufficiency  Normochromic normocytic anemia    Recent episode of rhabdomyolysis with residual pain. Old records are reviewed and she had acute renal failure with peak CK over 22,000. CK was down to under 800 when she was discharged 5 days ago. At this point, I feel she probably is having some ongoing soreness in her muscles from rhabdomyolysis but I do not see evidence of acute process. There is probably some irritation of the first and/or second cervical nerve on the right to account for her paresthesia in the scalp. When admitted, she did have MRI scans which showed some swelling of the paracervical muscles. Laboratory tests will be repeated today and CT of head and cervical spine will be repeated.  Workup shows CK is back to normal, hemoglobin has increased, creatinine continues to decrease although is still mildly elevated. CT of cervical spine shows some ongoing swelling in the right paracervical musculature. This may account  for her scalp paresthesia as this is the area that the first 2 cervical nerves would pass through. This was all explained to the patient. Advised that based on severity of her presentation, it may be weeks or even  months before she feels like she is completely back to normal. She is to follow-up with her PCP. No immediate intervention is needed tonight.  Delora Fuel, MD 16/10/96 [id number]

## 2014-07-19 NOTE — Discharge Instructions (Signed)
Continue to drink plenty of fluids, eat a balanced diet, and get enough rest. All of your tests are back to normal, or markedly improved. It will take time for everything to get back to normal.

## 2014-07-23 DIAGNOSIS — R202 Paresthesia of skin: Secondary | ICD-10-CM | POA: Insufficient documentation

## 2014-07-26 ENCOUNTER — Other Ambulatory Visit: Payer: Self-pay | Admitting: Internal Medicine

## 2014-09-29 ENCOUNTER — Encounter: Payer: Self-pay | Admitting: Neurology

## 2014-09-29 ENCOUNTER — Ambulatory Visit (INDEPENDENT_AMBULATORY_CARE_PROVIDER_SITE_OTHER): Payer: Medicaid Other | Admitting: Neurology

## 2014-09-29 VITALS — BP 137/92 | HR 60 | Ht 64.0 in | Wt 203.4 lb

## 2014-09-29 DIAGNOSIS — M542 Cervicalgia: Secondary | ICD-10-CM

## 2014-09-29 DIAGNOSIS — T148XXA Other injury of unspecified body region, initial encounter: Secondary | ICD-10-CM

## 2014-09-29 DIAGNOSIS — R569 Unspecified convulsions: Secondary | ICD-10-CM | POA: Diagnosis not present

## 2014-09-29 DIAGNOSIS — R55 Syncope and collapse: Secondary | ICD-10-CM | POA: Diagnosis not present

## 2014-09-29 MED ORDER — NORTRIPTYLINE HCL 25 MG PO CAPS: 25.0000 mg | ORAL_CAPSULE | Freq: Two times a day (BID) | ORAL | Status: DC

## 2014-09-29 MED ORDER — CYCLOBENZAPRINE HCL 5 MG PO TABS: 5.0000 mg | ORAL_TABLET | Freq: Three times a day (TID) | ORAL | Status: DC | PRN

## 2014-09-29 NOTE — Progress Notes (Signed)
[facility]    Provider:  Dr Jaynee Eagles Referring Provider: [facility], [facility] Primary Care Physician:  [facility]  CC:  Syncopal episode  HPI:  Jill Hopkins is a 44 y.o. female here as a referral for syncope. PMHx of hypothyroidism, obesity, substance abuse, HTN, depression. Syncopal episode happened in March. No episodes since then.  She went to the bathroom, thinks she passed out and as a result hit her head on the sink, she woke up in the floor. She was unconscious for 12 hours. She had a knot in her head. She went to the ED. ED notes state she had a CPK>20000 and ARF. She had cocaine and other substances in her system. She is vague on how often cocaine was used and when she used it in relationship to the syncopal episodes but states her boyfriend sold/sells drugs. Since she hit her head and neck and back,  she has generalized pain, right-sided neck pain and left forearm swelling and hand weakness. No personal history of seizures but last year she passed out twice in the heat when it was very hot and her BP was elevated. She endorses non-compliance with her medication snad her BP is still high. Since March she has not had any syncopal episodes, no seizure symptoms, abnormal movements or otherwise.No FHx of seizures. She is having numbness in the left back of the head and pain in the neck. She has throbbing on the right side of the scalp where she had trauma. No other focal neurologic deficits.   Reviewed notes, labs and imaging from outside physicians, which showed:  MRi of the brain negative (personally reviewed images) Echocardiogram normal  CT of the head 07/2014: IMPRESSION: 1. No evidence of traumatic intracranial injury or fracture. 2. No evidence of fracture or subluxation along the cervical spine. 3. Diffusely decreased attenuation and mild soft tissue edema along the musculature of the right side at the base of skull and right posterior  neck, likely reflecting the patient's known rhabdomyolysis. 4. Minimal degenerative change along the lower cervical spine.  EKG QTC 431 personally reviewed images  EEG normal  CMP with elevated creatinine CBC with anemia and elevated platelets CK 4/1 normal, CK 07/02/14 22,255   Review of Systems: Patient complains of symptoms per HPI as well as the following symptoms: Fatigue, anemia, headache, numbness, depression, anxiety. Pertinent negatives per HPI. All others negative.   History   Social History  . Marital Status: Single    Spouse Name: N/A  . Number of Children: N/A  . Years of Education: N/A   Occupational History  . Not on file.   Social History Main Topics  . Smoking status: Former Smoker    Types: Cigarettes    Quit date: 03/04/2013  . Smokeless tobacco: Never Used  . Alcohol Use: No  . Drug Use: No  . Sexual Activity:    Partners: Male    Birth Control/ Protection: None   Other Topics Concern  . Not on file   Social History Narrative    No family history on file.  Past Medical History  Diagnosis Date  . Hypertension   . Thyroid disease   . Depression   . Renal disorder     Past Surgical History  Procedure Laterality Date  . Cervical cerclage      Current Outpatient Prescriptions  Medication Sig Dispense Refill  . amLODipine (NORVASC) 10 MG tablet Take 10 mg by mouth daily.  5  .  cyclobenzaprine (FLEXERIL) 5 MG tablet Take 1 tablet (5 mg total) by mouth 3 (three) times daily as needed for muscle spasms. 30 tablet 0  . ferrous sulfate 325 (65 FE) MG EC tablet Take 325 mg by mouth daily.     Marland Kitchen ketoprofen (ORUDIS) 75 MG capsule Take 75 mg by mouth 3 (three) times daily.  2  . levothyroxine (SYNTHROID, LEVOTHROID) 150 MCG tablet Take 1 tablet (150 mcg total) by mouth daily. 30 tablet 0  . metoprolol succinate (TOPROL-XL) 100 MG 24 hr tablet Take 100 mg by mouth daily. Take with or immediately following a meal.    . nortriptyline (PAMELOR) 25  MG capsule Take 25 mg by mouth at bedtime.    Marland Kitchen NUVARING 0.12-0.015 MG/24HR vaginal ring USE AS DIRECTED 1 each 2  . ABILIFY 2 MG tablet Take 2 mg by mouth daily.  5  . buPROPion (WELLBUTRIN SR) 150 MG 12 hr tablet Take 150 mg by mouth 2 (two) times daily.    Marland Kitchen gabapentin (NEURONTIN) 300 MG capsule Take 300 mg by mouth 2 (two) times daily.    Marland Kitchen LORazepam (ATIVAN) 2 MG tablet Take 2 mg by mouth daily.  0  . oxyCODONE 10 MG TABS Take 1-1.5 tablets (10-15 mg total) by mouth every 4 (four) hours as needed for moderate pain. (Patient not taking: Reported on 09/29/2014) 40 tablet 0   No current facility-administered medications for this visit.    Allergies as of 09/29/2014 - Review Complete 09/29/2014  Allergen Reaction Noted  . Ibuprofen Nausea And Vomiting 06/03/2011    Vitals: BP 137/92 mmHg  Pulse 60  Ht 5\' 4"  (1.626 m)  Wt 203 lb 6.4 oz (92.262 kg)  BMI 34.90 kg/m2 Last Weight:  Wt Readings from Last 1 Encounters:  09/29/14 203 lb 6.4 oz (92.262 kg)   Last Height:   Ht Readings from Last 1 Encounters:  09/29/14 5\' 4"  (1.626 m)   Physical exam: Exam: Gen: NAD, conversant, well nourised, obese, well groomed                     CV: RRR, no MRG. No Carotid Bruits. No peripheral edema, warm, nontender Eyes: Conjunctivae clear without exudates or hemorrhage  Neuro: Detailed Neurologic Exam  Speech:    Speech is normal; fluent and spontaneous with normal comprehension.  Cognition:    The patient is oriented to person, place, and time;     recent and remote memory intact;     language fluent;     normal attention, concentration,     fund of knowledge Cranial Nerves:    The pupils are equal, round, and reactive to light. The fundi are normal and spontaneous venous pulsations are present. Visual fields are full to finger confrontation. Extraocular movements are intact. Trigeminal sensation is intact and the muscles of mastication are normal. The face is symmetric. The palate  elevates in the midline. Hearing intact. Voice is normal. Shoulder shrug is normal. The tongue has normal motion without fasciculations.   Coordination:    Normal finger to nose and heel to shin. Normal rapid alternating movements.   Gait:    Heel-toe and tandem gait are normal.   Motor Observation:    No asymmetry, no atrophy, and no involuntary movements noted. Tone:    Normal muscle tone.    Posture:    Posture is normal. normal erect    Strength:    Strength is V/V in the upper and lower limbs.  Sensation: intact to LT     Reflex Exam:  DTR's:    Deep tendon reflexes in the upper and lower extremities are normal bilaterally.   Toes:    The toes are downgoing bilaterally.   Clonus:    Clonus is absent.       Assessment/Plan:  44 year old female here as a referral for one episode of syncope. PMHx of hypothyroidism, obesity, substance abuse, HTN, depression, non-compliance with medications. Syncopal episode happened in March. No episodes since then.  Cocaine and other substances found in her urine. Discussed with patient that there could be multiple reasons she had a syncopal episode including vasovagal, polysubstance abuse and other reasons. Unlikely seizure activity. Recommend continuing nortriptyline and Flexeril for her continued pain. Will order an EEG. Encouraged compliance with her medication as well as cessation of any illegal substances.  Sarina Ill, MD  [facility] [address] [place],  [address]  Phone [phone] Fax [phone]

## 2014-09-29 NOTE — Patient Instructions (Signed)
Overall you are doing fairly well but I do want to suggest a few things today:   Remember to drink plenty of fluid, eat healthy meals and do not skip any meals. Try to eat protein with a every meal and eat a healthy snack such as fruit or nuts in between meals. Try to keep a regular sleep-wake schedule and try to exercise daily, particularly in the form of walking, 20-30 minutes a day, if you can.   As far as your medications are concerned, I would like to suggest: continue Nortriptyline and Flexeril  As far as diagnostic testing: EEG  I would like to see you back for EEG, sooner if we need to. Please call us with any interim questions, concerns, problems, updates or refill requests.   Please also call us for any test results so we can go over those with you on the phone.  My clinical assistant and will answer any of your questions and relay your messages to me and also relay most of my messages to you.   Our phone number is [phone]. We also have an after hours call service for urgent matters and there is a physician on-call for urgent questions. For any emergencies you know to call 911 or go to the nearest emergency room

## 2015-01-14 ENCOUNTER — Telehealth: Payer: Self-pay

## 2015-01-14 NOTE — Telephone Encounter (Signed)
9/23 left message to call & schedule PT eval, left message 9/27 to call. No return calls

## 2015-01-29 ENCOUNTER — Encounter: Payer: Self-pay | Admitting: Neurology

## 2015-01-29 ENCOUNTER — Ambulatory Visit (INDEPENDENT_AMBULATORY_CARE_PROVIDER_SITE_OTHER): Payer: Medicaid Other | Admitting: Neurology

## 2015-01-29 VITALS — BP 178/112 | HR 98 | Ht 64.0 in | Wt 193.2 lb

## 2015-01-29 DIAGNOSIS — R404 Transient alteration of awareness: Secondary | ICD-10-CM | POA: Diagnosis not present

## 2015-01-29 NOTE — Progress Notes (Signed)
CC: Syncopal episode  Interval history: Her blood pressure is high today in the office, no dizziness or chest pain or SOB or lightheadedness or confusion or other neurologic symptoms. She has not had any other episodes. No syncopal episodes, no episodes of confusion. She never scheduled eeg or PT.  HPI: Jill Hopkins is a 44 y.o. female here as a referral for syncope. PMHx of hypothyroidism, obesity, substance abuse, HTN, depression. Syncopal episode happened in March. No episodes since then. She went to the bathroom, thinks she passed out and as a result hit her head on the sink, she woke up in the floor. She was unconscious for 12 hours. She had a knot in her head. She went to the ED. ED notes state she had a CPK>20000 and ARF. She had cocaine and other substances in her system. She is vague on how often cocaine was used and when she used it in relationship to the syncopal episodes but states her boyfriend sold/sells drugs. Since she hit her head and neck and back, she has generalized pain, right-sided neck pain and left forearm swelling and hand weakness. No personal history of seizures but last year she passed out twice in the heat when it was very hot and her BP was elevated. She endorses non-compliance with her medication snad her BP is still high. Since March she has not had any syncopal episodes, no seizure symptoms, abnormal movements or otherwise.No FHx of seizures. She is having numbness in the left back of the head and pain in the neck. She has throbbing on the right side of the scalp where she had trauma. No other focal neurologic deficits.   Reviewed notes, labs and imaging from outside physicians, which showed:  MRi of the brain negative (personally reviewed images) Echocardiogram normal  CT of the head 07/2014: IMPRESSION: 1. No evidence of traumatic intracranial injury or fracture. 2. No evidence of fracture or subluxation along the cervical spine. 3. Diffusely decreased  attenuation and mild soft tissue edema along the musculature of the right side at the base of skull and right posterior neck, likely reflecting the patient's known rhabdomyolysis. 4. Minimal degenerative change along the lower cervical spine.  EKG QTC 431 personally reviewed images  EEG normal  CMP with elevated creatinine CBC with anemia and elevated platelets CK 4/1 normal, CK 07/02/14 22,255  Review of Systems: Patient complains of symptoms per HPI as well as the following symptoms no cp, no sob. Fatigue.  Pertinent negatives per HPI. All others negative.   Social History   Social History  . Marital Status: Single    Spouse Name: N/A  . Number of Children: 1  . Years of Education: 12   Occupational History  . Not on file.   Social History Main Topics  . Smoking status: Former Smoker    Types: Cigarettes    Quit date: 08/17/2014  . Smokeless tobacco: Never Used  . Alcohol Use: No  . Drug Use: No  . Sexual Activity:    Partners: Male    Birth Control/ Protection: None   Other Topics Concern  . Not on file   Social History Narrative   Lives at home with mother   Caffeine use: 1 cup tea per day    Family History  Problem Relation Age of Onset  . Hypertension Mother     Past Medical History  Diagnosis Date  . Hypertension   . Thyroid disease   . Depression   . Renal disorder  Past Surgical History  Procedure Laterality Date  . Cervical cerclage      Current Outpatient Prescriptions  Medication Sig Dispense Refill  . ABILIFY 2 MG tablet Take 2 mg by mouth daily.  5  . amLODipine (NORVASC) 10 MG tablet Take 10 mg by mouth daily.  5  . buPROPion (WELLBUTRIN SR) 150 MG 12 hr tablet Take 150 mg by mouth 2 (two) times daily.    . cyclobenzaprine (FLEXERIL) 5 MG tablet Take 1 tablet (5 mg total) by mouth 3 (three) times daily as needed for muscle spasms. 45 tablet 3  . ferrous sulfate 325 (65 FE) MG EC tablet Take 325 mg by mouth daily.     Marland Kitchen  gabapentin (NEURONTIN) 300 MG capsule Take 300 mg by mouth 2 (two) times daily.    Marland Kitchen ketoprofen (ORUDIS) 75 MG capsule Take 75 mg by mouth 3 (three) times daily.  2  . levothyroxine (SYNTHROID, LEVOTHROID) 150 MCG tablet Take 1 tablet (150 mcg total) by mouth daily. 30 tablet 0  . LORazepam (ATIVAN) 2 MG tablet Take 2 mg by mouth daily.  0  . metoprolol succinate (TOPROL-XL) 100 MG 24 hr tablet Take 100 mg by mouth daily. Take with or immediately following a meal.    . nortriptyline (PAMELOR) 25 MG capsule Take 1 capsule (25 mg total) by mouth 2 (two) times daily. 60 capsule 3  . NUVARING 0.12-0.015 MG/24HR vaginal ring USE AS DIRECTED 1 each 2  . oxyCODONE 10 MG TABS Take 1-1.5 tablets (10-15 mg total) by mouth every 4 (four) hours as needed for moderate pain. (Patient not taking: Reported on 09/29/2014) 40 tablet 0   No current facility-administered medications for this visit.    Allergies as of 01/29/2015 - Review Complete 01/29/2015  Allergen Reaction Noted  . Ibuprofen Nausea And Vomiting 06/03/2011    Vitals: BP 177/117 mmHg  Pulse 98  Ht 5\' 4"  (1.626 m)  Wt 193 lb 3.2 oz (87.635 kg)  BMI 33.15 kg/m2 Last Weight:  Wt Readings from Last 1 Encounters:  01/29/15 193 lb 3.2 oz (87.635 kg)   Last Height:   Ht Readings from Last 1 Encounters:  01/29/15 5\' 4"  (1.626 m)     Neuro: Detailed Neurologic Exam  Speech:  Speech is normal; fluent and spontaneous with normal comprehension.  Cognition:  The patient is oriented to person, place, and time;   recent and remote memory intact;   language fluent;   normal attention, concentration,   fund of knowledge Cranial Nerves:  The pupils are equal, round, and reactive to light. The fundi are normal and spontaneous venous pulsations are present. Visual fields are full to finger confrontation. Extraocular movements are intact. Trigeminal sensation is intact and the muscles of mastication are normal. The face is  symmetric. The palate elevates in the midline. Hearing intact. Voice is normal. Shoulder shrug is normal. The tongue has normal motion without fasciculations.   Coordination:  Normal finger to nose and heel to shin. Normal rapid alternating movements.   Gait:  Heel-toe and tandem gait are normal.   Motor Observation:  No asymmetry, no atrophy, and no involuntary movements noted. Tone:  Normal muscle tone.   Posture:  Posture is normal. normal erect   Strength:  Strength is V/V in the upper and lower limbs.    Sensation: intact to LT   Reflex Exam:  DTR's:  Deep tendon reflexes in the upper and lower extremities are normal bilaterally.  Toes:  The toes  are downgoing bilaterally.  Clonus:  Clonus is absent.   Assessment/Plan: 43 year old female here as a referral for one episode of syncope. PMHx of hypothyroidism, obesity, substance abuse, HTN, depression, non-compliance with medications. Syncopal episode happened in March. No episodes since then. Cocaine and other substances found in her urine. Discussed with patient that there could be multiple reasons she had a syncopal episode including vasovagal, polysubstance abuse and other reasons. Unlikely seizure activity. Recommend continuing nortriptyline and Flexeril for her continued pain. Will order an EEG. Encouraged compliance with her medication as well as cessation of any illegal substances.  She did not schedule PT or EEG. Will reorder.  Advised to take HTN medications asap and call pcp. She should report directly to the ED for any neurologic symptoms.   Sarina Ill, MD  [facility] [address] [place], [place] [address]  Phone [phone] Fax [phone]  A total of 15 minutes was spent face-to-face with this patient. Over half this time was spent on counseling patient on the loss of consciousness diagnosis and different diagnostic and therapeutic  options available.

## 2015-02-05 ENCOUNTER — Ambulatory Visit: Payer: Medicaid Other | Admitting: Physical Therapy

## 2015-02-13 ENCOUNTER — Ambulatory Visit: Payer: Managed Care, Other (non HMO) | Attending: Neurology | Admitting: Physical Therapy

## 2015-02-26 ENCOUNTER — Other Ambulatory Visit: Payer: Medicaid Other

## 2015-03-11 ENCOUNTER — Other Ambulatory Visit: Payer: Medicaid Other

## 2015-03-23 ENCOUNTER — Other Ambulatory Visit: Payer: Managed Care, Other (non HMO)

## 2015-04-10 ENCOUNTER — Other Ambulatory Visit: Payer: Self-pay | Admitting: Obstetrics

## 2015-06-19 ENCOUNTER — Other Ambulatory Visit: Payer: Self-pay | Admitting: Neurology

## 2016-01-04 ENCOUNTER — Encounter: Payer: Self-pay | Admitting: Emergency Medicine

## 2016-01-04 ENCOUNTER — Emergency Department (HOSPITAL_COMMUNITY)
Admission: EM | Admit: 2016-01-04 | Discharge: 2016-01-04 | Disposition: A | Discharge status: Home / Self Care | Payer: Medicaid Other | Attending: Emergency Medicine | Admitting: Emergency Medicine

## 2016-01-04 ENCOUNTER — Emergency Department (HOSPITAL_COMMUNITY): Payer: Medicaid Other

## 2016-01-04 DIAGNOSIS — Z79899 Other long term (current) drug therapy: Secondary | ICD-10-CM | POA: Insufficient documentation

## 2016-01-04 DIAGNOSIS — Z87891 Personal history of nicotine dependence: Secondary | ICD-10-CM | POA: Insufficient documentation

## 2016-01-04 DIAGNOSIS — E860 Dehydration: Secondary | ICD-10-CM | POA: Diagnosis not present

## 2016-01-04 DIAGNOSIS — R197 Diarrhea, unspecified: Secondary | ICD-10-CM | POA: Insufficient documentation

## 2016-01-04 DIAGNOSIS — R55 Syncope and collapse: Secondary | ICD-10-CM | POA: Diagnosis present

## 2016-01-04 DIAGNOSIS — I10 Essential (primary) hypertension: Secondary | ICD-10-CM | POA: Diagnosis not present

## 2016-01-04 LAB — BASIC METABOLIC PANEL
Anion gap: 9 (ref 5–15)
BUN: 7 mg/dL (ref 6–20)
CO2: 26 mmol/L (ref 22–32)
Calcium: 9 mg/dL (ref 8.9–10.3)
Chloride: 103 mmol/L (ref 101–111)
Creatinine, Ser: 1.25 mg/dL — ABNORMAL HIGH (ref 0.44–1.00)
GFR calc Af Amer: >60 mL/min (ref >60)
GFR calc non Af Amer: 52 mL/min — ABNORMAL LOW (ref >60)
Glucose, Bld: 100 mg/dL — ABNORMAL HIGH (ref 65–99)
POTASSIUM: 3 mmol/L — AB (ref 3.5–5.1)
Sodium: 138 mmol/L (ref 135–145)

## 2016-01-04 LAB — URINALYSIS, ROUTINE W REFLEX MICROSCOPIC
Bilirubin Urine: NEGATIVE
GLUCOSE, UA: NEGATIVE mg/dL
Hgb urine dipstick: NEGATIVE
Ketones, ur: NEGATIVE mg/dL
LEUKOCYTES UA: NEGATIVE
Nitrite: NEGATIVE
PH: 7 (ref 5.0–8.0)
Protein, ur: 30 mg/dL — AB
Specific Gravity, Urine: 1.019 (ref 1.005–1.030)

## 2016-01-04 LAB — I-STAT BETA HCG BLOOD, ED (MC, WL, AP ONLY): I-stat hCG, quantitative: <5 m[IU]/mL (ref <5)

## 2016-01-04 LAB — CBC
HEMATOCRIT: 29.5 % — AB (ref 36.0–46.0)
Hemoglobin: 10.3 g/dL — ABNORMAL LOW (ref 12.0–15.0)
MCH: 31.4 pg (ref 26.0–34.0)
MCHC: 34.9 g/dL (ref 30.0–36.0)
MCV: 89.9 fL (ref 78.0–100.0)
Platelets: 226 10*3/uL (ref 150–400)
RBC: 3.28 MIL/uL — ABNORMAL LOW (ref 3.87–5.11)
RDW: 13.2 % (ref 11.5–15.5)
WBC: 3.3 10*3/uL — AB (ref 4.0–10.5)

## 2016-01-04 LAB — RAPID URINE DRUG SCREEN, HOSP PERFORMED
Amphetamines: NOT DETECTED
BENZODIAZEPINES: NOT DETECTED
Barbiturates: NOT DETECTED
Cocaine: POSITIVE — AB
Opiates: NOT DETECTED
Tetrahydrocannabinol: POSITIVE — AB

## 2016-01-04 LAB — CBG MONITORING, ED: Glucose-Capillary: 103 mg/dL — ABNORMAL HIGH (ref 65–99)

## 2016-01-04 LAB — URINE MICROSCOPIC-ADD ON

## 2016-01-04 MED ORDER — LOPERAMIDE HCL 2 MG PO CAPS: 2.0000 mg | ORAL_CAPSULE | Freq: Four times a day (QID) | ORAL | 0 refills | Status: DC | PRN

## 2016-01-04 MED ORDER — METOPROLOL SUCCINATE ER 100 MG PO TB24: 100.0000 mg | ORAL_TABLET | Freq: Every day | ORAL | 1 refills | Status: DC

## 2016-01-04 MED ORDER — ONDANSETRON 4 MG PO TBDP
4.0000 mg | ORAL_TABLET | Freq: Once | ORAL | Status: AC
  Administered 2016-01-04: 4 mg via ORAL
  Filled 2016-01-04: qty 1

## 2016-01-04 MED ORDER — DICYCLOMINE HCL 20 MG PO TABS: 20.0000 mg | ORAL_TABLET | Freq: Two times a day (BID) | ORAL | 0 refills | Status: DC

## 2016-01-04 MED ORDER — DICYCLOMINE HCL 10 MG PO CAPS
20.0000 mg | ORAL_CAPSULE | Freq: Once | ORAL | Status: AC
  Administered 2016-01-04: 20 mg via ORAL
  Filled 2016-01-04: qty 2

## 2016-01-04 MED ORDER — POTASSIUM CHLORIDE CRYS ER 20 MEQ PO TBCR
40.0000 meq | EXTENDED_RELEASE_TABLET | Freq: Once | ORAL | Status: AC
  Administered 2016-01-04: 40 meq via ORAL
  Filled 2016-01-04: qty 2

## 2016-01-04 MED ORDER — ONDANSETRON HCL 4 MG PO TABS: 4.0000 mg | ORAL_TABLET | Freq: Three times a day (TID) | ORAL | 0 refills | Status: DC | PRN

## 2016-01-04 MED ORDER — SODIUM CHLORIDE 0.9 % IV SOLN: INTRAVENOUS | Status: DC

## 2016-01-04 MED ORDER — SODIUM CHLORIDE 0.9 % IV BOLUS (SEPSIS)
1000.0000 mL | Freq: Once | INTRAVENOUS | Status: AC
  Administered 2016-01-04: 1000 mL via INTRAVENOUS

## 2016-01-04 NOTE — Discharge Instructions (Signed)
GET HELP RIGHT AWAY IF:  You cannot drink fluids without throwing up (vomiting). You keep throwing up. You have blood in your poop, or your poop looks black and tarry. You do not pee (urinate) in 6-8 hours, or there is only a small amount of very dark pee. You have belly (abdominal) pain that gets worse or stays in the same spot (localizes). You are weak, dizzy, confused, or light-headed. You have a very bad headache. Your watery poop gets worse or does not get better. You have a fever or lasting symptoms for more than 2-3 days. You have a fever and your symptoms suddenly get worse.

## 2016-01-04 NOTE — ED Notes (Signed)
Patient was alert, oriented and stable upon discharge. RN went over AVS and patient had no further questions.  

## 2016-01-04 NOTE — ED Triage Notes (Addendum)
Pt states she got hot and nauseated today and had a near syncopal episode. Hypertensive. States she has not taken her meds in some time because she doesn't have a doctor. Neuro intact. Alert and oriented.

## 2016-01-04 NOTE — ED Provider Notes (Signed)
[facility] DEPT Provider Note   CSN: [id number] Arrival date & time: 01/04/16  1721     History   Chief Complaint Chief Complaint  Patient presents with  . Near Syncope  . Hypertension    HPI Jill Hopkins is a 45 y.o. female who  has a past medical history of Depression; Hypertension; Renal disorder; and Thyroid disease. She presents to the ED with cc of Syncope. The patient has a history of previous syncopal episodes and has been evaluated in patient, as well as with neurology. The patient states that her daughter. Her mother and herself have all had diarrhea. Over the past couple days. She's had multiple episodes of watery diarrhea. Today she vomited on herself. She got diaphoretic and weak and had difficulty walking down the stairs. She felt as if she were going to pass out but did not lose consciousness. She also has a history of hypertension, states that she has not been going to the doctor or taking any of her medications because she is to have a car right now.. She denies a history of fevers, melena, hematochezia, bilious or bloody vomitus.   HPI  Past Medical History:  Diagnosis Date  . Depression   . Hypertension   . Renal disorder   . Thyroid disease     Patient Active Problem List   Diagnosis Date Noted  . Paresthesias   . Acute kidney failure ([place]) 07/02/2014  . Rhabdomyolysis 07/02/2014  . Syncope and collapse 07/02/2014  . Skin eruption 07/02/2014  . Hypertension 07/02/2014  . Thyroid disease 07/02/2014  . Depression 07/02/2014  . Neck pain 07/02/2014  . Abnormal uterine bleeding (AUB) 04/09/2013  . Essential hypertension, benign 04/09/2013  . BV (bacterial vaginosis) 04/01/2013  . Excessive or frequent menstruation 03/18/2013  . Candidal skin infection 03/18/2013    Past Surgical History:  Procedure Laterality Date  . CERVICAL CERCLAGE      OB History    No data available       Home Medications    Prior to Admission medications     Medication Sig Start Date End Date Taking? Authorizing Provider  ABILIFY 2 MG tablet Take 2 mg by mouth daily. 04/27/14   Historical Provider, MD  amLODipine (NORVASC) 10 MG tablet Take 10 mg by mouth daily. 04/20/14   Historical Provider, MD  buPROPion (WELLBUTRIN SR) 150 MG 12 hr tablet Take 150 mg by mouth 2 (two) times daily.    Historical Provider, MD  cyclobenzaprine (FLEXERIL) 5 MG tablet TAKE 1 TABLET BY MOUTH 3 TIMES A DAY AS NEEDED FOR MUSCLE SPASM 06/22/15   Melvenia Beam, MD  ferrous sulfate 325 (65 FE) MG EC tablet Take 325 mg by mouth daily.     Historical Provider, MD  gabapentin (NEURONTIN) 300 MG capsule Take 300 mg by mouth 2 (two) times daily.    Historical Provider, MD  ketoprofen (ORUDIS) 75 MG capsule Take 75 mg by mouth 3 (three) times daily. 09/05/14   Historical Provider, MD  levothyroxine (SYNTHROID, LEVOTHROID) 150 MCG tablet Take 1 tablet (150 mcg total) by mouth daily. 07/13/14   Shanker Kristeen Mans, MD  LORazepam (ATIVAN) 2 MG tablet Take 2 mg by mouth daily. 08/04/14   Historical Provider, MD  metoprolol succinate (TOPROL-XL) 100 MG 24 hr tablet Take 100 mg by mouth daily. Take with or immediately following a meal.    Historical Provider, MD  nortriptyline (PAMELOR) 25 MG capsule Take 1 capsule (25 mg total) by mouth 2 (two)  times daily. 09/29/14   Melvenia Beam, MD  NUVARING 0.12-0.015 MG/24HR vaginal ring USE AS DIRECTED 04/10/15   Shelly Bombard, MD  oxyCODONE 10 MG TABS Take 1-1.5 tablets (10-15 mg total) by mouth every 4 (four) hours as needed for moderate pain. Patient not taking: Reported on 09/29/2014 07/13/14   Jonetta Osgood, MD    Family History Family History  Problem Relation Age of Onset  . Hypertension Mother     Social History Social History  Substance Use Topics  . Smoking status: Former Smoker    Types: Cigarettes    Quit date: 08/17/2014  . Smokeless tobacco: Never Used  . Alcohol use No     Allergies   Ibuprofen   Review of  Systems Review of Systems  Ten systems reviewed and are negative for acute change, except as noted in the HPI.   Physical Exam Updated Vital Signs BP (!) 213/115 (BP Location: Right Arm)   Pulse 69   Temp 97.6 F (36.4 C) (Oral)   Resp 18   SpO2 100%   Physical Exam  Physical Exam  Nursing note and vitals reviewed. Constitutional: She is oriented to person, place, and time. She appears well-developed and well-nourished. No distress.  HENT:  Head: Normocephalic and atraumatic.  Dry oral mucosa Eyes: Conjunctivae normal and EOM are normal. Pupils are equal, round, and reactive to light. No scleral icterus.  Neck: Normal range of motion.  Cardiovascular: Normal rate, regular rhythm and normal heart sounds.  Exam reveals no gallop and no friction rub.   No murmur heard. Pulmonary/Chest: Effort normal and breath sounds normal. No respiratory distress.  Abdominal: Soft. Bowel sounds are normal. She exhibits no distension and no mass. There is no tenderness. There is no guarding.  Neurological: She is alert and oriented to person, place, and time.  Skin: Skin is warm and dry. She is not diaphoretic.    ED Treatments / Results  Labs (all labs ordered are listed, but only abnormal results are displayed) Labs Reviewed  BASIC METABOLIC PANEL - Abnormal; Notable for the following:       Result Value   Potassium 3.0 (*)    Glucose, Bld 100 (*)    Creatinine, Ser 1.25 (*)    GFR calc non Af Amer 52 (*)    All other components within normal limits  CBC - Abnormal; Notable for the following:    WBC 3.3 (*)    RBC 3.28 (*)    Hemoglobin 10.3 (*)    HCT 29.5 (*)    All other components within normal limits  CBG MONITORING, ED - Abnormal; Notable for the following:    Glucose-Capillary 103 (*)    All other components within normal limits  URINALYSIS, ROUTINE W REFLEX MICROSCOPIC (NOT AT [facility])  URINE RAPID DRUG SCREEN, HOSP PERFORMED  I-STAT BETA HCG BLOOD, ED ([facility], [facility], [facility] ONLY)   POCT CBG (FASTING - GLUCOSE)-MANUAL ENTRY    EKG  EKG Interpretation  Date/Time:  Monday January 04 2016 17:48:38 EDT Ventricular Rate:  68 PR Interval:    QRS Duration: 118 QT Interval:  478 QTC Calculation: 513 R Axis:   -11 Text Interpretation:  Sinus rhythm Incomplete left bundle branch block Low voltage, precordial leads since last tracing no significant change Confirmed by Eulis Foster  MD, Vira Agar [id number]) on 01/04/2016 5:56:26 PM       Radiology No results found.  Procedures Procedures (including critical care time)  Medications Ordered in ED Medications  potassium chloride SA (K-DUR,KLOR-CON) CR tablet 40 mEq (not administered)  sodium chloride 0.9 % bolus 1,000 mL (not administered)    And  0.9 %  sodium chloride infusion (not administered)     Initial Impression / Assessment and Plan / ED Course  I have reviewed the triage vital signs and the nursing notes.  Pertinent labs & imaging results that were available during my care of the patient were reviewed by me and considered in my medical decision making (see chart for details).  Clinical Course   Hypertensive. Not taking meds. I suspect vasovagal event secondary to diarrhea/vomiting and dehydration. UDS again positive for cocaine. No cp/sob. Patient noted to be hypertensive in the emergency department.  No signs of hypertensive urgency.  Discussed with patient the need for close follow-up and management by their primary care physician.  Patient with symptoms consistent with viral gastroenteritis.  Vitals are stable, no fever.  No signs of dehydration, tolerating PO fluids > 6 oz.  Lungs are clear.  No focal abdominal pain, no concern for appendicitis, cholecystitis, pancreatitis, ruptured viscus, UTI, kidney stone, or any other abdominal etiology.  Supportive therapy indicated with return if symptoms worsen.  Patient counseled.   Final Clinical Impressions(s) / ED Diagnoses   Final diagnoses:  None    New  Prescriptions New Prescriptions   No medications on file     Margarita Mail, PA-C 01/06/16 [place], MD 01/09/16 [phone]

## 2016-09-21 ENCOUNTER — Ambulatory Visit: Payer: Managed Care, Other (non HMO)

## 2016-09-21 ENCOUNTER — Ambulatory Visit (INDEPENDENT_AMBULATORY_CARE_PROVIDER_SITE_OTHER): Payer: Medicaid Other | Admitting: Internal Medicine

## 2016-09-21 VITALS — BP 183/89 | HR 50 | Temp 98.2°F | Ht 64.0 in | Wt 156.3 lb

## 2016-09-21 DIAGNOSIS — R202 Paresthesia of skin: Secondary | ICD-10-CM | POA: Diagnosis not present

## 2016-09-21 DIAGNOSIS — Z87891 Personal history of nicotine dependence: Secondary | ICD-10-CM

## 2016-09-21 DIAGNOSIS — K0889 Other specified disorders of teeth and supporting structures: Secondary | ICD-10-CM

## 2016-09-21 DIAGNOSIS — I10 Essential (primary) hypertension: Secondary | ICD-10-CM

## 2016-09-21 DIAGNOSIS — Z8249 Family history of ischemic heart disease and other diseases of the circulatory system: Secondary | ICD-10-CM

## 2016-09-21 DIAGNOSIS — Z79899 Other long term (current) drug therapy: Secondary | ICD-10-CM

## 2016-09-21 DIAGNOSIS — F332 Major depressive disorder, recurrent severe without psychotic features: Secondary | ICD-10-CM | POA: Diagnosis not present

## 2016-09-21 DIAGNOSIS — E039 Hypothyroidism, unspecified: Secondary | ICD-10-CM | POA: Diagnosis not present

## 2016-09-21 DIAGNOSIS — Z7689 Persons encountering health services in other specified circumstances: Secondary | ICD-10-CM

## 2016-09-21 DIAGNOSIS — Z114 Encounter for screening for human immunodeficiency virus [HIV]: Secondary | ICD-10-CM

## 2016-09-21 DIAGNOSIS — E079 Disorder of thyroid, unspecified: Secondary | ICD-10-CM

## 2016-09-21 DIAGNOSIS — N189 Chronic kidney disease, unspecified: Secondary | ICD-10-CM | POA: Diagnosis not present

## 2016-09-21 DIAGNOSIS — N920 Excessive and frequent menstruation with regular cycle: Secondary | ICD-10-CM

## 2016-09-21 DIAGNOSIS — I129 Hypertensive chronic kidney disease with stage 1 through stage 4 chronic kidney disease, or unspecified chronic kidney disease: Secondary | ICD-10-CM

## 2016-09-21 DIAGNOSIS — A6000 Herpesviral infection of urogenital system, unspecified: Secondary | ICD-10-CM | POA: Insufficient documentation

## 2016-09-21 MED ORDER — LEVOTHYROXINE SODIUM 150 MCG PO TABS: 150.0000 ug | ORAL_TABLET | Freq: Every day | ORAL | 0 refills | Status: DC

## 2016-09-21 MED ORDER — AMLODIPINE BESYLATE 10 MG PO TABS: 10.0000 mg | ORAL_TABLET | Freq: Every day | ORAL | 0 refills | Status: DC

## 2016-09-21 MED ORDER — BUPROPION HCL ER (SR) 150 MG PO TB12: 150.0000 mg | ORAL_TABLET | Freq: Two times a day (BID) | ORAL | 0 refills | Status: DC

## 2016-09-21 NOTE — Patient Instructions (Signed)
It was a pleasure to meet you today Jill Hopkins.  I am sorry to hear you are feeling so poorly without access to your medications today. You are doing the right thing for yourself and your health by trying to get these problems under control by coming to see [place].  I recommend restarting your amlodipine, levothyroxine, and wellbutrin immediately and have sent these prescriptions to your pharmacy.  We will check several blood tests today and see how your kidney and thyroid function are currently. I want to see these plus hopefully get your previous clinic records before starting additional medicines.  We will see you within 2 weeks for blood pressure recheck and how you are feeling.

## 2016-09-21 NOTE — Progress Notes (Signed)
   CC: Need to re-establish medical care for hypertension, hypothyroidism, chronic kidney disease, and depression  HPI:  Jill Hopkins is a 46 y.o. woman with a medical history significant for hypertension, hypothyroidism, CKD, depression, blood clots, substance use disorder, vasovagal syncope who is here today to re-establish medical care and resume some of her medications.   See problem based assessment and plan below for additional details  Past Medical History:  Diagnosis Date  . Depression   . Hypertension   . Renal disorder   . Thyroid disease    Family History  Problem Relation Age of Onset  . Hypertension Mother    Past Surgical History:  Procedure Laterality Date  . CERVICAL CERCLAGE     Social History   Social History  . Marital status: Single    Spouse name: N/A  . Number of children: 1  . Years of education: 61   Social History Main Topics  . Smoking status: Former Smoker    Types: Cigarettes    Quit date: 08/17/2014  . Smokeless tobacco: Never Used  . Alcohol use No  . Drug use: No  . Sexual activity: Yes    Partners: Male    Birth control/ protection: None            Reports former substance use history of cocaine, THC  Other Topics Concern  . None   Social History Narrative   Lives at home with mother   Caffeine use: 1 cup tea per day    Review of Systems:  Review of Systems  Constitutional: Positive for malaise/fatigue. Negative for chills and fever.  HENT: Negative for hearing loss.   Eyes: Positive for blurred vision. Negative for double vision.  Respiratory: Negative for cough.   Cardiovascular: Negative for chest pain and leg swelling.  Gastrointestinal: Negative for blood in stool.  Genitourinary: Positive for frequency and urgency.  Musculoskeletal: Positive for joint pain and myalgias. Negative for falls.  Skin: Negative for rash.  Neurological: Positive for tingling and sensory change.  Psychiatric/Behavioral: Positive for  depression. Negative for suicidal ideas. The patient does not have insomnia.     Physical Exam: Physical Exam  Constitutional: She is well-developed, well-nourished, and in no distress.  HENT:  Mouth/Throat: Oropharynx is clear and moist. No oropharyngeal exudate.  Poor dentition, missing left molars  Eyes: Conjunctivae are normal.  Cardiovascular: Normal rate and regular rhythm.   Pulmonary/Chest: Effort normal and breath sounds normal.  Abdominal: Soft. There is no tenderness.  Musculoskeletal: Normal range of motion. She exhibits no edema.  Tinel test painful  Lymphadenopathy:    She has no cervical adenopathy.  Neurological: She is alert. She has normal reflexes.  Skin: No rash noted.  Psychiatric:  Flat affect    Vitals:   09/21/16 1508  BP: (!) 183/89  Pulse: (!) 50  Temp: 98.2 F (36.8 C)  TempSrc: Oral  SpO2: 100%  Weight: 156 lb 4.8 oz (70.9 kg)  Height: 5\' 4"  (1.626 m)    Assessment & Plan:   See Encounters Tab for problem based charting.  Patient discussed with Dr. Lynnae January

## 2016-09-22 LAB — CBC
Hematocrit: 31.5 % — ABNORMAL LOW (ref 34.0–46.6)
Hemoglobin: 10.6 g/dL — ABNORMAL LOW (ref 11.1–15.9)
MCH: 33 pg (ref 26.6–33.0)
MCHC: 33.7 g/dL (ref 31.5–35.7)
MCV: 98 fL — AB (ref 79–97)
Platelets: 234 10*3/uL (ref 150–379)
RBC: 3.21 x10E6/uL — ABNORMAL LOW (ref 3.77–5.28)
RDW: 13 % (ref 12.3–15.4)
WBC: 3.4 10*3/uL (ref 3.4–10.8)

## 2016-09-22 LAB — BMP8+ANION GAP
ANION GAP: 15 mmol/L (ref 10.0–18.0)
BUN/Creatinine Ratio: 7 — ABNORMAL LOW (ref 9–23)
BUN: 11 mg/dL (ref 6–24)
CHLORIDE: 99 mmol/L (ref 96–106)
CO2: 26 mmol/L (ref 18–29)
Calcium: 9.6 mg/dL (ref 8.7–10.2)
Creatinine, Ser: 1.53 mg/dL — ABNORMAL HIGH (ref 0.57–1.00)
GFR calc Af Amer: 48 mL/min/{1.73_m2} — ABNORMAL LOW (ref >59)
GFR, EST NON AFRICAN AMERICAN: 41 mL/min/{1.73_m2} — AB (ref >59)
GLUCOSE: 83 mg/dL (ref 65–99)
POTASSIUM: 3.3 mmol/L — AB (ref 3.5–5.2)
Sodium: 140 mmol/L (ref 134–144)

## 2016-09-22 LAB — TSH: TSH: 82.12 u[IU]/mL — ABNORMAL HIGH (ref 0.450–4.500)

## 2016-09-22 LAB — HIV ANTIBODY (ROUTINE TESTING W REFLEX): HIV SCREEN 4TH GENERATION: NONREACTIVE

## 2016-09-23 ENCOUNTER — Encounter: Payer: Self-pay | Admitting: Internal Medicine

## 2016-09-23 NOTE — Assessment & Plan Note (Signed)
HPI: Her mood is extremely poor with lack of appetite, lack of energy, excessive sleeping, and difficulty concentrating on tasks. She was previously on Wellbutrin and Abilify that she thinks helped a lot but has not taken these in over a year. PHQ-9 score today is 20. She denies suicidal ideation or any history of suicide attempts.  A: Severe depression without psychotic features or suicidal ideation. Her untreated hypothyroidism is probably contributing to some of these somatic manifestations as well.  P: Restart bupropion 150mg  BID today

## 2016-09-23 NOTE — Assessment & Plan Note (Signed)
HPI: She has intermittent bialteral hand numbness and tingling. This is worst at night and does not radiate to other locations. She has no symptoms in her legs or feet. Grip strength is fine and symptoms are typically less severe during the day.  A: History and physical exam are suggestive of carpal tunnel syndrome today.  P: Re-evaluate symptoms at follow up, if still suggestive consider starting with night time wrist splinting

## 2016-09-23 NOTE — Assessment & Plan Note (Signed)
HPI: Iatrogenic hypothyroidism s/p treatment for Graves disease in 2010. She is off her levothyroxine over one year. She reports excessive fatigue sleeping 10+ hours every day, depressed mood, and cold intolerance. Her appetite is also poor with weight loss recently.  A: Probably symptomatic hypothyroidism off medication  P: Restart levothyroxine 112mcg daily Check TSH today - 82.120

## 2016-09-23 NOTE — Assessment & Plan Note (Signed)
She reports heavy menstruation that has been a longterm problem for her improved by her use of the nuvaring for the past approximately ten years. She states this was discontinued earlier this year with return of heavy menses. Periods are regular in frequency with variable duration and volume of bleeding but no longer than 1 week.  Complaint of heavy menstrual bleeding probably without actual menorrhagia from reported bleeding volumes.  We will check CBC today for anemia - Hgb 10.6 with MCV 98 this is equal or better than past recordings and iron deficiency or symptomatic anemia are very unlikely

## 2016-09-23 NOTE — Assessment & Plan Note (Signed)
HPI: Self reported home blood pressures are 832P systolic. She feels miserable all over including headaches. She was previously on metoprolol succinate, losartan, and amlodipine but has been off all medications over one year.  A: Severely uncontrolled hypertension. It is impossible to say if this contributes to her diffuse and varied symptoms today. This has been uncontrolled for a year so we will restart one medication today with close follow up.  P: Start amlodipine 10mg  daily RTC in 2 weeks

## 2016-09-23 NOTE — Assessment & Plan Note (Addendum)
Ms. Spackman specifically requests an HIV test at the visit today. She has been monogamous with her current boyfriend for at least the past 2 years but is unsure about his sexual activity/history. She uses the NuvaRing and does not use barrier contraception. She has a history of HSV-2 and past STIs but offers no specific complaints at this time. She is not sure if or when she was last checked for HIV and does not have her outside medical records on hand.   Check HIV test today in a normal risk individual- Negative Will need to clarify again if she does have any GU complaints. I would have a low threshold for STI testing given her expressed concern

## 2016-09-23 NOTE — Assessment & Plan Note (Addendum)
HPI: She has a chronic kidney disease with SCr 1.25 at previous ED visit in 12/2015.  A: Probable CKD stage 3. Her severely uncontrolled hypertension is the most likely explanation for chronic medical renal disease. She does also have a history of past acute renal failure from rhabdomyolysis associated with a syncopal episode and cocaine use in 2016.  P: Bmet today - SCr 1.5 She would be a candidate for restarting an ACE-I/ARB for suspected hypertensive nephropathy. UA at follow up visit also recommended.

## 2016-09-26 NOTE — Progress Notes (Signed)
Internal Medicine Clinic Attending  Case discussed with Dr. Rice at the time of the visit.  We reviewed the resident's history and exam and pertinent patient test results.  I agree with the assessment, diagnosis, and plan of care documented in the resident's note.  

## 2016-09-28 ENCOUNTER — Telehealth: Payer: Self-pay

## 2016-09-28 ENCOUNTER — Other Ambulatory Visit: Payer: Self-pay | Admitting: Internal Medicine

## 2016-09-28 MED ORDER — GABAPENTIN 300 MG PO CAPS: 300.0000 mg | ORAL_CAPSULE | Freq: Two times a day (BID) | ORAL | 2 refills | Status: DC

## 2016-09-28 NOTE — Telephone Encounter (Signed)
Requesting lab results. Please call pt back.  

## 2016-09-28 NOTE — Telephone Encounter (Signed)
Called her back with results and discussed plan of care until F/U appointment later this month

## 2016-10-12 ENCOUNTER — Other Ambulatory Visit (HOSPITAL_COMMUNITY)
Admission: RE | Admit: 2016-10-12 | Discharge: 2016-10-12 | Disposition: A | Discharge status: Home / Self Care | Payer: Medicaid Other | Source: Ambulatory Visit | Attending: Obstetrics | Admitting: Obstetrics

## 2016-10-12 ENCOUNTER — Ambulatory Visit (INDEPENDENT_AMBULATORY_CARE_PROVIDER_SITE_OTHER): Payer: Medicaid Other | Admitting: Obstetrics

## 2016-10-12 ENCOUNTER — Encounter: Payer: Self-pay | Admitting: Obstetrics

## 2016-10-12 VITALS — BP 139/98 | HR 77 | Ht 64.0 in | Wt 145.0 lb

## 2016-10-12 DIAGNOSIS — Z01419 Encounter for gynecological examination (general) (routine) without abnormal findings: Secondary | ICD-10-CM | POA: Insufficient documentation

## 2016-10-12 DIAGNOSIS — Z113 Encounter for screening for infections with a predominantly sexual mode of transmission: Secondary | ICD-10-CM

## 2016-10-12 DIAGNOSIS — N898 Other specified noninflammatory disorders of vagina: Secondary | ICD-10-CM

## 2016-10-12 DIAGNOSIS — Z0000 Encounter for general adult medical examination without abnormal findings: Secondary | ICD-10-CM | POA: Diagnosis not present

## 2016-10-12 DIAGNOSIS — R87810 Cervical high risk human papillomavirus (HPV) DNA test positive: Secondary | ICD-10-CM | POA: Insufficient documentation

## 2016-10-12 DIAGNOSIS — Z1239 Encounter for other screening for malignant neoplasm of breast: Secondary | ICD-10-CM

## 2016-10-12 DIAGNOSIS — N939 Abnormal uterine and vaginal bleeding, unspecified: Secondary | ICD-10-CM

## 2016-10-12 DIAGNOSIS — D500 Iron deficiency anemia secondary to blood loss (chronic): Secondary | ICD-10-CM

## 2016-10-12 DIAGNOSIS — Z30015 Encounter for initial prescription of vaginal ring hormonal contraceptive: Secondary | ICD-10-CM

## 2016-10-12 MED ORDER — ETONOGESTREL-ETHINYL ESTRADIOL 0.12-0.015 MG/24HR VA RING: VAGINAL_RING | VAGINAL | 12 refills | Status: DC

## 2016-10-12 MED ORDER — FERROUS SULFATE 325 (65 FE) MG PO TABS: 325.0000 mg | ORAL_TABLET | Freq: Every day | ORAL | 11 refills | Status: DC

## 2016-10-12 MED ORDER — PRENATE MINI 29-0.6-0.4-350 MG PO CAPS: 1.0000 | ORAL_CAPSULE | Freq: Every day | ORAL | 11 refills | Status: DC

## 2016-10-12 MED ORDER — OXYCODONE-ACETAMINOPHEN 10-325 MG PO TABS: 1.0000 | ORAL_TABLET | ORAL | 0 refills | Status: DC | PRN

## 2016-10-12 NOTE — Progress Notes (Signed)
Subjective:        Jill Hopkins is a 46 y.o. female here for a routine exam.  Current complaints: Heavy and painful periods.  Periods last for 2 weeks with clotting.  Has lost a lot of weight.  PMH significant for HTN and Hypothyroidism, on amLODipine and levothyroxine.  Also has history of substance abuse.  Personal health questionnaire:  Is patient Ashkenazi Jewish, have a family history of breast and/or ovarian cancer: no Is there a family history of uterine cancer diagnosed at age < 45, gastrointestinal cancer, urinary tract cancer, family member who is a Field seismologist syndrome-associated carrier: no Is the patient overweight and hypertensive, family history of diabetes, personal history of gestational diabetes, preeclampsia or PCOS: no Is patient over 59, have PCOS,  family history of premature CHD under age 30, diabetes, smoke, have hypertension or peripheral artery disease:  no At any time, has a partner hit, kicked or otherwise hurt or frightened you?: no Over the past 2 weeks, have you felt down, depressed or hopeless?: no Over the past 2 weeks, have you felt little interest or pleasure in doing things?:no   Gynecologic History No LMP recorded. Contraception: NuvaRing vaginal inserts Last Pap: 2014. Results were: normal Last mammogram: 2013. Results were: normal  Obstetric History OB History  No data available    Past Medical History:  Diagnosis Date  . Depression   . Hypertension   . Renal disorder   . Thyroid disease     Past Surgical History:  Procedure Laterality Date  . CERVICAL CERCLAGE       Current Outpatient Prescriptions:  .  amLODipine (NORVASC) 10 MG tablet, Take 1 tablet (10 mg total) by mouth daily., Disp: 30 tablet, Rfl: 0 .  buPROPion (WELLBUTRIN SR) 150 MG 12 hr tablet, Take 1 tablet (150 mg total) by mouth 2 (two) times daily., Disp: 60 tablet, Rfl: 0 .  gabapentin (NEURONTIN) 300 MG capsule, Take 1 capsule (300 mg total) by mouth 2 (two) times  daily., Disp: 60 capsule, Rfl: 2 .  levothyroxine (SYNTHROID, LEVOTHROID) 150 MCG tablet, Take 1 tablet (150 mcg total) by mouth daily., Disp: 30 tablet, Rfl: 0 .  NUVARING 0.12-0.015 MG/24HR vaginal ring, USE AS DIRECTED, Disp: 1 each, Rfl: 1 .  etonogestrel-ethinyl estradiol (NUVARING) 0.12-0.015 MG/24HR vaginal ring, Insert vaginally and leave in place for 3 consecutive weeks, then remove for 1 week., Disp: 1 each, Rfl: 12 .  ferrous sulfate 325 (65 FE) MG tablet, Take 1 tablet (325 mg total) by mouth daily with breakfast., Disp: 60 tablet, Rfl: 11 .  Prenat w/o A-FeCbn-Meth-FA-DHA (PRENATE MINI) 29-0.6-0.4-350 MG CAPS, Take 1 capsule by mouth daily before breakfast., Disp: 30 capsule, Rfl: 11 Allergies  Allergen Reactions  . Ibuprofen Nausea And Vomiting    Social History  Substance Use Topics  . Smoking status: Current Some Day Smoker    Types: Cigarettes    Last attempt to quit: 08/17/2014  . Smokeless tobacco: Never Used  . Alcohol use No    Family History  Problem Relation Age of Onset  . Hypertension Mother       Review of Systems  Constitutional: negative for fatigue and weight loss Respiratory: negative for cough and wheezing Cardiovascular: negative for chest pain, fatigue and palpitations Gastrointestinal: negative for abdominal pain and change in bowel habits Musculoskeletal:negative for myalgias Neurological: negative for gait problems and tremors Behavioral/Psych: negative for abusive relationship, depression Endocrine: negative for temperature intolerance    Genitourinary:negative for abnormal menstrual periods,  genital lesions, hot flashes, sexual problems and vaginal discharge Integument/breast: negative for breast lump, breast tenderness, nipple discharge and skin lesion(s)    Objective:       BP (!) 139/98   Pulse 77   Ht 5\' 4"  (1.626 m)   Wt 145 lb (65.8 kg)   BMI 24.89 kg/m  General:   alert  Skin:   no rash or abnormalities  Lungs:   clear to  auscultation bilaterally  Heart:   regular rate and rhythm, S1, S2 normal, no murmur, click, rub or gallop  Breasts:   normal without suspicious masses, skin or nipple changes or axillary nodes  Abdomen:  normal findings: no organomegaly, soft, non-tender and no hernia  Pelvis:  External genitalia: normal general appearance Urinary system: urethral meatus normal and bladder without fullness, nontender Vaginal: normal without tenderness, induration or masses Cervix: normal appearance Adnexa: normal bimanual exam Uterus: anteverted and non-tender, normal size   Lab Review Urine pregnancy test Labs reviewed yes Radiologic studies reviewed yes  50% of 20 min visit spent on counseling and coordination of care.    Assessment:     1. Encounter for routine gynecological examination with Papanicolaou smear of cervix Rx: - Hemoglobin A1c - Urine Culture - Cytology - PAP  2. Encounter for initial prescription of vaginal ring hormonal contraceptive Rx. - etonogestrel-ethinyl estradiol (NUVARING) 0.12-0.015 MG/24HR vaginal ring; Insert vaginally and leave in place for 3 consecutive weeks, then remove for 1 week.  Dispense: 1 each; Refill: 12  3. Screen for STD (sexually transmitted disease) Rx: - HIV antibody - Hepatitis B surface antigen - RPR - Hepatitis C antibody  4. Abnormal uterine bleeding (AUB) Rx: - US Pelvis Complete; Future - US Transvaginal Non-OB; Future  5. Iron deficiency anemia due to chronic blood loss Rx: - ferrous sulfate 325 (65 FE) MG tablet; Take 1 tablet (325 mg total) by mouth daily with breakfast.  Dispense: 60 tablet; Refill: 11 - Prenat w/o A-FeCbn-Meth-FA-DHA (PRENATE MINI) 29-0.6-0.4-350 MG CAPS; Take 1 capsule by mouth daily before breakfast.  Dispense: 30 capsule; Refill: 11  6. Screening breast examination Rx: - MM Digital Screening; Future  7. Vaginal discharge Rx: - Cervicovaginal ancillary only  8. Hypothyroidism - not well controlled -  Levothyroxine ( noncompliant )   Plan:    Education reviewed: calcium supplements, depression evaluation, low fat, low cholesterol diet, safe sex/STD prevention, self breast exams, smoking cessation and weight bearing exercise. Contraception: IUD. Mammogram ordered. Follow up in: 2 weeks.   Meds ordered this encounter  Medications  . etonogestrel-ethinyl estradiol (NUVARING) 0.12-0.015 MG/24HR vaginal ring    Sig: Insert vaginally and leave in place for 3 consecutive weeks, then remove for 1 week.    Dispense:  1 each    Refill:  12  . DISCONTD: oxyCODONE-acetaminophen (PERCOCET) 10-325 MG tablet    Sig: Take 1 tablet by mouth every 4 (four) hours as needed for pain.    Dispense:  30 tablet    Refill:  0  . ferrous sulfate 325 (65 FE) MG tablet    Sig: Take 1 tablet (325 mg total) by mouth daily with breakfast.    Dispense:  60 tablet    Refill:  11  . Prenat w/o A-FeCbn-Meth-FA-DHA (PRENATE MINI) 29-0.6-0.4-350 MG CAPS    Sig: Take 1 capsule by mouth daily before breakfast.    Dispense:  30 capsule    Refill:  11   Orders Placed This Encounter  Procedures  . Urine Culture  .  US Pelvis Complete    Standing Status:   Future    Standing Expiration Date:   12/12/2017    Order Specific Question:   Reason for Exam (SYMPTOM  OR DIAGNOSIS REQUIRED)    Answer:   AUB    Order Specific Question:   Preferred imaging location?    Answer:   [facility]  . US Transvaginal Non-OB    Standing Status:   Future    Standing Expiration Date:   12/12/2017    Order Specific Question:   Reason for Exam (SYMPTOM  OR DIAGNOSIS REQUIRED)    Answer:   AUB    Order Specific Question:   Preferred imaging location?    Answer:   [facility]  . MM Digital Screening    Standing Status:   Future    Standing Expiration Date:   12/12/2017    Order Specific Question:   Reason for Exam (SYMPTOM  OR DIAGNOSIS REQUIRED)    Answer:   Screening.    Order Specific Question:   Is the patient pregnant?     Answer:   No    Order Specific Question:   Preferred imaging location?    Answer:   [facility]  . HIV antibody  . Hepatitis B surface antigen  . RPR  . Hepatitis C antibody  . Hemoglobin A1c     Patient ID: Jill Hopkins, female   DOB: 11/11/1970, 46 y.o.   MRN: [id number]

## 2016-10-13 LAB — CERVICOVAGINAL ANCILLARY ONLY
Bacterial vaginitis: POSITIVE — AB
Candida vaginitis: NEGATIVE
Chlamydia: NEGATIVE
NEISSERIA GONORRHEA: NEGATIVE
TRICH (WINDOWPATH): NEGATIVE

## 2016-10-13 LAB — HEMOGLOBIN A1C
Est. average glucose Bld gHb Est-mCnc: 91 mg/dL
Hgb A1c MFr Bld: 4.8 % (ref 4.8–5.6)

## 2016-10-13 LAB — RPR: RPR: NONREACTIVE

## 2016-10-13 LAB — HEPATITIS C ANTIBODY

## 2016-10-13 LAB — HEPATITIS B SURFACE ANTIGEN: HEP B S AG: NEGATIVE

## 2016-10-13 LAB — HIV ANTIBODY (ROUTINE TESTING W REFLEX): HIV Screen 4th Generation wRfx: NONREACTIVE

## 2016-10-14 ENCOUNTER — Encounter: Payer: Self-pay | Admitting: Internal Medicine

## 2016-10-14 ENCOUNTER — Other Ambulatory Visit: Payer: Self-pay | Admitting: Obstetrics

## 2016-10-14 ENCOUNTER — Ambulatory Visit (INDEPENDENT_AMBULATORY_CARE_PROVIDER_SITE_OTHER): Payer: Medicaid Other | Admitting: Internal Medicine

## 2016-10-14 VITALS — BP 139/90 | HR 91 | Temp 98.2°F | Wt 143.9 lb

## 2016-10-14 DIAGNOSIS — Z9181 History of falling: Secondary | ICD-10-CM

## 2016-10-14 DIAGNOSIS — I10 Essential (primary) hypertension: Secondary | ICD-10-CM | POA: Diagnosis present

## 2016-10-14 DIAGNOSIS — N760 Acute vaginitis: Principal | ICD-10-CM

## 2016-10-14 DIAGNOSIS — I639 Cerebral infarction, unspecified: Secondary | ICD-10-CM | POA: Diagnosis not present

## 2016-10-14 DIAGNOSIS — F17210 Nicotine dependence, cigarettes, uncomplicated: Secondary | ICD-10-CM | POA: Diagnosis not present

## 2016-10-14 DIAGNOSIS — W19XXXA Unspecified fall, initial encounter: Secondary | ICD-10-CM | POA: Insufficient documentation

## 2016-10-14 DIAGNOSIS — R634 Abnormal weight loss: Secondary | ICD-10-CM | POA: Diagnosis not present

## 2016-10-14 DIAGNOSIS — R2231 Localized swelling, mass and lump, right upper limb: Secondary | ICD-10-CM

## 2016-10-14 DIAGNOSIS — Z79899 Other long term (current) drug therapy: Secondary | ICD-10-CM | POA: Diagnosis not present

## 2016-10-14 DIAGNOSIS — E079 Disorder of thyroid, unspecified: Secondary | ICD-10-CM

## 2016-10-14 DIAGNOSIS — B9689 Other specified bacterial agents as the cause of diseases classified elsewhere: Secondary | ICD-10-CM

## 2016-10-14 DIAGNOSIS — R296 Repeated falls: Secondary | ICD-10-CM | POA: Diagnosis not present

## 2016-10-14 LAB — PROTIME-INR
INR: 1.04
Prothrombin Time: 13.6 seconds (ref 11.4–15.2)

## 2016-10-14 LAB — CYTOLOGY - PAP
DIAGNOSIS: NEGATIVE
HPV: DETECTED — AB

## 2016-10-14 LAB — URINE CULTURE: Organism ID, Bacteria: NO GROWTH

## 2016-10-14 MED ORDER — METRONIDAZOLE 500 MG PO TABS: 500.0000 mg | ORAL_TABLET | Freq: Two times a day (BID) | ORAL | 2 refills | Status: DC

## 2016-10-14 MED ORDER — LEVOTHYROXINE SODIUM 150 MCG PO TABS: 150.0000 ug | ORAL_TABLET | Freq: Every day | ORAL | 0 refills | Status: DC

## 2016-10-14 MED ORDER — AMLODIPINE BESYLATE 10 MG PO TABS: 10.0000 mg | ORAL_TABLET | Freq: Every day | ORAL | 0 refills | Status: DC

## 2016-10-14 NOTE — Progress Notes (Addendum)
   CC: Patient is here for follow-up of hypertension. She had multiple complaints including recent falls, pain all over, a knot on her arm, and unintentional weight loss. She is also requesting a urine drug screen.  HPI:  [name] is a 46 y.o. female with a past medical history of conditions listed below presenting to the clinic for a follow-up of hypertension. She has multiple complaints including recent falls, pain all over, a knot on her arm, and unintentional weight loss. She is also requesting a urine drug screen Please see problem based charting for the status of the patient's current and chronic medical conditions.   Past Medical History:  Diagnosis Date  . Depression   . Hypertension   . Renal disorder   . Thyroid disease    Review of Systems: Pertinent positives mentioned in HPI. Remainder of all ROS negative.   Physical Exam:  Vitals:   10/14/16 1541 10/14/16 1607  BP: (!) 146/99 139/90  Pulse: (!) 105 91  Temp: 98.2 F (36.8 C)   TempSrc: Oral   SpO2: 100%   Weight: 143 lb 14.4 oz (65.3 kg)    Physical Exam  Constitutional: She is oriented to person, place, and time. She appears well-developed and well-nourished. No distress.  HENT:  Head: Normocephalic and atraumatic.  Eyes: Right eye exhibits no discharge. Left eye exhibits no discharge.  Cardiovascular: Normal rate, regular rhythm and intact distal pulses.   Pulmonary/Chest: Effort normal and breath sounds normal. No respiratory distress. She has no wheezes. She has no rales.  Abdominal: Soft. Bowel sounds are normal. She exhibits no distension. There is no tenderness.  Musculoskeletal: She exhibits no edema.  A hard immobile 1.5 cm mass palpated on the ventral aspect of the right arm. A bruise noted on the overlying skin.  Neurological: She is alert and oriented to person, place, and time.  Skin: Skin is warm and dry.  Psychiatric:  Appears angry and upset   Addendum 10/18/2016: Neuro exam done during  the visit was non-focal. CN II-XII grossly intact. Strength 5/5 and sensation to light touch grossly intact in bilateral upper and lower extremities. No gait abnormality noted.    Assessment & Plan:   See Encounters Tab for problem based charting.  Patient discussed with Dr. Daryll Drown

## 2016-10-14 NOTE — Assessment & Plan Note (Addendum)
History of present illness Patient is complaining of 4 falls in the past 3 weeks since she started taking amlodipine. States a 1 week ago she saw "stars," started sweating, felt dizzy, and fell. Then 4 days ago, she fell again because her legs felt weak. Patient denies having any heart palpitations or chest pain prior to these episodes of falls. Denies having any loss of consciousness at any time. Reports having chronic "migraines." States her gait has been "staggering" for the past few weeks and she does not drink alcohol. Also reports having tingling in her hands and feet. She has a history of cocaine use but denies using any drugs for the past 1 month. Patient kept on requesting a urine drug screen during this visit stating she would like to know if her system is clear before she applies for a job. When I explained to her there was no need to do a repeat UDS, she became angry and upset.  Assessment Orthostatics checked during this visit normal. 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 This could possibly explain her staggering gait and multiple falls. Patient also noted to have chronic anemia on labs. Recent labs done on 09/21/2016 showing hemoglobin 10.6 and MCV borderline high at 98. Patient does have a history of cocaine use but denies using any drugs in the past 1 month.  Plan -MRI brain without contrast -Check B12 level -Return to the clinic in 2 weeks  Addendum 10/18/2016: Neuro exam was non-focal and no gait abnormality noted on exam on the day of visit. B12 normal. MRI brain still pending. Tried calling the patient but could not reach her over the phone.   Addendum 11/25/2016: Brain MRI done 11/24/2016 showing a new non-acute right cerebellar infarct which could explain her symptoms. Per MRI read, her mild Chiari I malformation is stable and patient has chronic small vessel ischemic disease. Echo done in March 2016 did not report a PFO. Carotid Dopplers done in March  2016 showing 1-39% stenosis on the right and 40-59% stenosis on the left. Recently checked hemoglobin A1c in June 2018 was 4.8. No prior lipid panel in the chart. Prior EKGs done in September 2017 and March 2016 showing sinus rhythm. Her blood pressure has been slightly above goal of <130/80 per new ACC guidelines. Patient is current smoker - 1 cigarette per week.  -Order echo -Order carotid Dopplers -Check lipid panel -Start aspirin -Advised patient to stop smoking. Since she is smoking 1 cigarette on occasion, patient believes she will be able to quit on her own.  -Referral to neurology -She has an appointment with her PCP on 12/08/2016. We need to work toward optimizing her BP control. Continue to counsel on smoking cessation.

## 2016-10-14 NOTE — Patient Instructions (Signed)
Jill Hopkins it was nice meeting you today.  Please return for a follow-up in 2 weeks.

## 2016-10-14 NOTE — Assessment & Plan Note (Addendum)
History of present illness Patient reports having unintentional weight. States she has been eating well and not exercising. Denies having any nausea, vomiting, or diarrhea. She denies any family history of cancer in her first-degree relatives. States her maternal aunt was recently diagnosed with "56 polyps" which might be cancer. States she recently started taking Synthroid but was not taking any medications for the past 8 months. States she has a mammogram scheduled in 2 weeks. Also reports having "knots" and "blue patches" all over her body that come and go. States at present she only has one "knot" with an overlying "blue patch" on her right arm which she first noticed 3 weeks ago. Denies having any trauma to her arm. States the "knot" has been getting smaller in size. Reports having pain all over her body and is requesting a pain medication.   Assessment Unclear etiology of her unintentional weight loss. Patient does have a history of hypothyroidism. TSH checked on 09/21/2016 was 82, likely in the setting of patient not taking her medications for 8 months. This would not explain weight loss. Pap smear done 2 days ago positive for HPV but negative for intraepithelial lesions or malignancy. Mammogram done in 2013 was normal. She reports smoking 2 cigarettes per day for 6-10 years; quit 1 month ago. Chest x-ray done in September 2017 was negative. Patient noted to have a 1.5 cm hard immobile mass on the ventral aspect of her right arm with a bruise on the overlying skin. Labs showing leukopenia since September 2017. She reports having pain all over her body but appears very comfortable on exam and in no distress.  Plan -CBC with differential -CMP -PT/ INR -Ultrasound of soft tissue of the right arm -Advised patient to avoid NSAIDs due to history of CKD. When I suggested Tylenol, patient became angry and stated this visit was worthless. -Return to the clinic in 2 weeks  Addendum 10/18/2016: Continues to  be leukopenic (WBC 2.8). Continues to be anemic (Hgb 9.9 with MCV 98). Cr 1.0 (at baseline). PT/ INR normal. Ultrasound of soft tissue of the right arm pending. Tried calling the patient but could not reach her over the phone.

## 2016-10-14 NOTE — Assessment & Plan Note (Signed)
Assessment Initial blood pressure 146/99 and repeat 139/90 at this visit. Blood pressure on 10/12/2016 was 139/98. Patient is currently taking amlodipine 10 mg daily.  Plan -Continue current management

## 2016-10-14 NOTE — Assessment & Plan Note (Signed)
Synthroid refilled per patient request.

## 2016-10-15 LAB — CMP14 + ANION GAP
A/G RATIO: 1.5 (ref 1.2–2.2)
ALBUMIN: 5.1 g/dL (ref 3.5–5.5)
ALT: 6 IU/L (ref 0–32)
ANION GAP: 15 mmol/L (ref 10.0–18.0)
AST: 17 IU/L (ref 0–40)
Alkaline Phosphatase: 38 IU/L — ABNORMAL LOW (ref 39–117)
BUN / CREAT RATIO: 10 (ref 9–23)
BUN: 11 mg/dL (ref 6–24)
Bilirubin Total: 0.5 mg/dL (ref 0.0–1.2)
CALCIUM: 9.9 mg/dL (ref 8.7–10.2)
CO2: 22 mmol/L (ref 20–29)
CREATININE: 1.07 mg/dL — AB (ref 0.57–1.00)
Chloride: 101 mmol/L (ref 96–106)
GFR calc non Af Amer: 63 mL/min/{1.73_m2} (ref >59)
GFR, EST AFRICAN AMERICAN: 73 mL/min/{1.73_m2} (ref >59)
GLOBULIN, TOTAL: 3.3 g/dL (ref 1.5–4.5)
Glucose: 79 mg/dL (ref 65–99)
POTASSIUM: 4.1 mmol/L (ref 3.5–5.2)
SODIUM: 138 mmol/L (ref 134–144)
Total Protein: 8.4 g/dL (ref 6.0–8.5)

## 2016-10-15 LAB — CBC WITH DIFFERENTIAL/PLATELET
Basophils Absolute: 0 10*3/uL (ref 0.0–0.2)
Basos: 1 %
EOS (ABSOLUTE): 0.1 10*3/uL (ref 0.0–0.4)
Eos: 5 %
HEMATOCRIT: 30.5 % — AB (ref 34.0–46.6)
HEMOGLOBIN: 9.9 g/dL — AB (ref 11.1–15.9)
Immature Grans (Abs): 0 10*3/uL (ref 0.0–0.1)
Immature Granulocytes: 0 %
LYMPHS ABS: 1 10*3/uL (ref 0.7–3.1)
Lymphs: 34 %
MCH: 31.8 pg (ref 26.6–33.0)
MCHC: 32.5 g/dL (ref 31.5–35.7)
MCV: 98 fL — ABNORMAL HIGH (ref 79–97)
MONOCYTES: 8 %
Monocytes Absolute: 0.2 10*3/uL (ref 0.1–0.9)
Neutrophils Absolute: 1.5 10*3/uL (ref 1.4–7.0)
Neutrophils: 52 %
Platelets: 255 10*3/uL (ref 150–379)
RBC: 3.11 x10E6/uL — AB (ref 3.77–5.28)
RDW: 13.2 % (ref 12.3–15.4)
WBC: 2.8 10*3/uL — AB (ref 3.4–10.8)

## 2016-10-15 LAB — VITAMIN B12: Vitamin B-12: 362 pg/mL (ref 232–1245)

## 2016-10-17 ENCOUNTER — Other Ambulatory Visit: Payer: Self-pay | Admitting: Internal Medicine

## 2016-10-17 DIAGNOSIS — I10 Essential (primary) hypertension: Secondary | ICD-10-CM

## 2016-10-17 NOTE — Progress Notes (Signed)
Internal Medicine Clinic Attending  Case discussed with Dr. Rathoreat the time of the visit. We reviewed the resident's history and exam and pertinent patient test results. I agree with the assessment, diagnosis, and plan of care documented in the resident's note.  

## 2016-10-18 ENCOUNTER — Other Ambulatory Visit: Payer: Self-pay | Admitting: *Deleted

## 2016-10-18 ENCOUNTER — Other Ambulatory Visit: Payer: Self-pay | Admitting: Internal Medicine

## 2016-10-18 ENCOUNTER — Telehealth: Payer: Self-pay | Admitting: Internal Medicine

## 2016-10-18 DIAGNOSIS — F332 Major depressive disorder, recurrent severe without psychotic features: Secondary | ICD-10-CM

## 2016-10-18 MED ORDER — GABAPENTIN 300 MG PO CAPS: 300.0000 mg | ORAL_CAPSULE | Freq: Two times a day (BID) | ORAL | 0 refills | Status: DC

## 2016-10-18 MED ORDER — BUPROPION HCL ER (SR) 150 MG PO TB12: 150.0000 mg | ORAL_TABLET | Freq: Two times a day (BID) | ORAL | 0 refills | Status: DC

## 2016-10-18 NOTE — Telephone Encounter (Signed)
   Reason for call:   I received a call from [name] at 5:33 PM indicating she has received a call from clinic earlier today and was trying to return the phone call. She is requesting her lab results from her 6/29 visit. It appears that Dr. Marlowe Sax was calling her with the results but was unable to reach her. Discussed her lab results with her. She was very Patent attorney. She voiced concern over unintentional weight loss and her depression. She is very anxious and repeatedly asked if she was dying. Reassured her that her lab work so far has been fairly benign. Renal function has improved but she remains mildly anemic. She has an appointment scheduled in clinic on 10/27/16. Discussed that we would be happy to see here in clinic earlier if she was continuing to have problems.   Also requested refills on her Wellbutrin and Gabapentin. Will refill today.    Pertinent Data:   Requesting lab results and medication refill  Very anxious. Concerned about unintentional weight loss.   Clinic follow up scheduled on 10/27/16   Assessment / Plan / Recommendations:   Discussed results with patient. She was very Patent attorney. Refilled her Wellbutrin and gabapentin.   As always, pt is advised that if symptoms worsen or new symptoms arise, they should go to an urgent care facility or to to ER for further evaluation.   Maryellen Pile, MD   10/18/2016, 7:06 PM

## 2016-10-20 ENCOUNTER — Ambulatory Visit (HOSPITAL_COMMUNITY)
Admission: RE | Admit: 2016-10-20 | Discharge: 2016-10-20 | Disposition: A | Discharge status: Home / Self Care | Payer: Medicaid Other | Source: Ambulatory Visit | Attending: Obstetrics | Admitting: Obstetrics

## 2016-10-20 DIAGNOSIS — N939 Abnormal uterine and vaginal bleeding, unspecified: Secondary | ICD-10-CM

## 2016-10-20 DIAGNOSIS — D259 Leiomyoma of uterus, unspecified: Secondary | ICD-10-CM | POA: Insufficient documentation

## 2016-10-21 ENCOUNTER — Ambulatory Visit
Admission: RE | Admit: 2016-10-21 | Discharge: 2016-10-21 | Disposition: A | Discharge status: Home / Self Care | Payer: Medicaid Other | Source: Ambulatory Visit | Attending: Obstetrics | Admitting: Obstetrics

## 2016-10-21 ENCOUNTER — Other Ambulatory Visit: Payer: Self-pay | Admitting: Internal Medicine

## 2016-10-21 DIAGNOSIS — F332 Major depressive disorder, recurrent severe without psychotic features: Secondary | ICD-10-CM

## 2016-10-21 DIAGNOSIS — Z1231 Encounter for screening mammogram for malignant neoplasm of breast: Secondary | ICD-10-CM | POA: Diagnosis not present

## 2016-10-21 DIAGNOSIS — Z1239 Encounter for other screening for malignant neoplasm of breast: Secondary | ICD-10-CM

## 2016-10-27 ENCOUNTER — Ambulatory Visit (INDEPENDENT_AMBULATORY_CARE_PROVIDER_SITE_OTHER): Payer: Medicaid Other | Admitting: Internal Medicine

## 2016-10-27 ENCOUNTER — Ambulatory Visit: Payer: Medicaid Other | Admitting: Obstetrics

## 2016-10-27 VITALS — BP 134/86 | HR 85 | Temp 98.0°F | Ht 64.0 in | Wt 148.1 lb

## 2016-10-27 DIAGNOSIS — F332 Major depressive disorder, recurrent severe without psychotic features: Secondary | ICD-10-CM

## 2016-10-27 DIAGNOSIS — E079 Disorder of thyroid, unspecified: Secondary | ICD-10-CM

## 2016-10-27 DIAGNOSIS — E039 Hypothyroidism, unspecified: Secondary | ICD-10-CM | POA: Diagnosis not present

## 2016-10-27 DIAGNOSIS — Z79899 Other long term (current) drug therapy: Secondary | ICD-10-CM | POA: Diagnosis not present

## 2016-10-27 DIAGNOSIS — F329 Major depressive disorder, single episode, unspecified: Secondary | ICD-10-CM | POA: Diagnosis not present

## 2016-10-27 DIAGNOSIS — I10 Essential (primary) hypertension: Secondary | ICD-10-CM | POA: Diagnosis present

## 2016-10-27 DIAGNOSIS — Z87891 Personal history of nicotine dependence: Secondary | ICD-10-CM

## 2016-10-27 DIAGNOSIS — Z8249 Family history of ischemic heart disease and other diseases of the circulatory system: Secondary | ICD-10-CM | POA: Diagnosis not present

## 2016-10-27 MED ORDER — ARIPIPRAZOLE 2 MG PO TABS: 2.0000 mg | ORAL_TABLET | Freq: Every day | ORAL | 0 refills | Status: DC

## 2016-10-27 NOTE — Patient Instructions (Addendum)
Thank you for coming to see [place] today.   Please continue taking all medications as previously prescribed.  Please START taking aripiprazole, 2 mg tablets. You will take this medication once a day.  Please return to clinic in 4 weeks with your new PCP for follow up of your thyroid condition and depression.

## 2016-10-27 NOTE — Assessment & Plan Note (Signed)
BP at goal of <140/90 today. The patient will continue taking amlodipine 10 mg, daily since blood pressure is well controlled. Will continue to monitor at 1 month follow up with PCP.

## 2016-10-27 NOTE — Assessment & Plan Note (Addendum)
This problem is ongoing and not well controlled. The patient reports no improvement in her mood symptoms with bupropion, 150 mg BID. She states that she still has symptoms of depression and high levels of anxiety. Most of her anxiety is centered around her many symptoms and the possible medical conditions that may be causing all of her symptoms. The patient stated that aripiprazole, 2 mg daily had helped her mood symptoms in the past. She was initiated on this therapy today and instructed to follow up with her PCP in 4 weeks for monitoring of her symptoms.

## 2016-10-27 NOTE — Assessment & Plan Note (Addendum)
This problem is ongoing. Patient currently taking 150 mcg of levothyroxine daily. Her BP and HR in clinic were within normal limits, but symptoms of unintentional weight loss, dizziness, hot flashes, and fainting are concerning for a hyperthryoid state. Plan to obtain TSH today and will call patient with results. Will consider readjusting medication dose based of TSH levels. The patient will follow up in 1 month with PCP for monitoring of her symptoms and further management of this chronic condition.

## 2016-10-27 NOTE — Progress Notes (Signed)
   CC: HTN follow up  HPI:  [name] is a 46 y.o. with a PMH of HTN, hyperthyroidism s/p radioactive iodine therapy in 2010, and major depressive disorder who presents today for follow up of her HTN. The patient has a variety of chief complaints today and appears very anxious. She describes that she had an episode where she fainted and hit her head on the floor yesterday. Per chart review, she has been seen in the clinic for this problem recently. She did not go to the ED after this most recent fall. She states that she is concerned that she keeps having these episodes and feels that her doctor has not addressed these issues. She endorses headache, dizziness, SOB, diaphoresis, and headaches. She denies palpitations.   She reports taking all previously prescribed medications. She denies difficulty with these medications. She states that she would like more medication for her anxiety and for her chronic pain. She notes that lorezepam and Abilify have helped her anxiety in the past. Patient endorses being diagnosed with depression in the past but denies being diagnosed with bipolar disorder. She states that gabapentin has helped her pain, which she states occurs all the time and is unrelenting.   Past Medical History:  Diagnosis Date  . Depression   . Hypertension   . Renal disorder   . Thyroid disease    Review of Systems:   Patient endorses diaphoresis and unintentional weight loss in the previous few months.  Patient denies fevers, chills, cough, abdominal pain, melena, hematochezia, hematemesis, and lower extremity swelling.  Physical Exam:  Vitals:   10/27/16 1123  BP: 134/86  Pulse: 85  Temp: 98 F (36.7 C)  TempSrc: Oral  SpO2: 100%  Weight: 148 lb 1.6 oz (67.2 kg)  Height: 5\' 4"  (1.626 m)   Physical Exam  Constitutional:  Patient is comfortable in no acute distress. She is cooperative but asks many questions throughout the exam about her many medical conditions.    Neck: No thyromegaly present.  Cardiovascular: Normal rate, regular rhythm, normal heart sounds and intact distal pulses.  Exam reveals no friction rub.   No murmur heard. Pulmonary/Chest: Effort normal. No respiratory distress. She has no wheezes. She has no rales.  Abdominal: Soft. Bowel sounds are normal. She exhibits no distension. There is no tenderness.  Musculoskeletal: She exhibits no edema.  Skin: Skin is warm and dry. Capillary refill takes less than 2 seconds. No rash noted. No erythema.  Psychiatric:  The patient jumps from subject to subject throughout interview. Her thoughts are linear but she often interrupts the interviewer to ask probing questions about her medical history and recent lab results. She is redirectable and cooperative but sometimes angry with interviewer, stating that I am not listening to her complaints.   Assessment & Plan:   See Encounters Tab for problem based charting.  Patient seen with Dr. Evette Doffing.

## 2016-10-28 ENCOUNTER — Ambulatory Visit: Payer: Medicaid Other

## 2016-10-28 LAB — TSH: TSH: 0.973 u[IU]/mL (ref 0.450–4.500)

## 2016-10-28 NOTE — Progress Notes (Signed)
Internal Medicine Clinic Attending  I saw and evaluated the patient.  I personally confirmed the key portions of the history and exam documented by Dr. Nedrud and I reviewed pertinent patient test results.  The assessment, diagnosis, and plan were formulated together and I agree with the documentation in the resident's note.  

## 2016-11-01 ENCOUNTER — Telehealth: Payer: Self-pay

## 2016-11-01 NOTE — Telephone Encounter (Signed)
Requesting lab results. Please call pt back.  

## 2016-11-03 ENCOUNTER — Encounter: Payer: Self-pay | Admitting: Internal Medicine

## 2016-11-03 ENCOUNTER — Telehealth: Payer: Self-pay | Admitting: *Deleted
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
## 2016-11-03 NOTE — Telephone Encounter (Signed)
Thank you, routing this to Dr. Berneice Gandy

## 2016-11-03 NOTE — Telephone Encounter (Signed)
Tried calling patient over past week since last lab results were available, however the phone goes straight to voicemail. I wasn't sure if the patient was OK with me leaving a message on voicemail, so didn't do so with previous calls. I called again at 9 am on 11/03/2016 and left a voicemail for her to call clinic for lab results.   Her TSH was within normal limits and no change in therapy is needed at this time. Overall, lab results were within normal limits. I wanted to speak with her to make sure she was able to obtain new medication that was prescribed at last visit and see if she had any problems since she started taking it.  Will try to call back later.

## 2016-11-04 ENCOUNTER — Other Ambulatory Visit: Payer: Self-pay | Admitting: Obstetrics

## 2016-11-04 NOTE — Telephone Encounter (Signed)
Patient notified per Dr Jodi Mourning

## 2016-11-10 NOTE — Progress Notes (Deleted)
Jill Hopkins, thank you for seeing [name] for me and addressing her concerns. Unfortunately my continuity schedule is booked until October and given her concerns and the new medication I wouldn't want her to have to wait that long to see me so she may need to do a follow up in acute care and simultaneously schedule a continuity visit so that we can connect.

## 2016-11-11 ENCOUNTER — Other Ambulatory Visit: Payer: Self-pay | Admitting: Internal Medicine

## 2016-11-11 DIAGNOSIS — E079 Disorder of thyroid, unspecified: Secondary | ICD-10-CM

## 2016-11-14 ENCOUNTER — Other Ambulatory Visit: Payer: Self-pay | Admitting: Internal Medicine

## 2016-11-14 DIAGNOSIS — F332 Major depressive disorder, recurrent severe without psychotic features: Secondary | ICD-10-CM

## 2016-11-14 DIAGNOSIS — I10 Essential (primary) hypertension: Secondary | ICD-10-CM

## 2016-11-22 ENCOUNTER — Other Ambulatory Visit: Payer: Self-pay | Admitting: Internal Medicine

## 2016-11-22 DIAGNOSIS — F332 Major depressive disorder, recurrent severe without psychotic features: Secondary | ICD-10-CM

## 2016-11-22 NOTE — Telephone Encounter (Signed)
Ledell Noss, MD  [facility] Front Desk Pool        Good morning,   Ms. Noack was seen in clinic earlier this month and asked to switch PCPs at that time and was placed on my panel. She was told to schedule a follow up with PCP in one month and expressed her frustration of seeing different providers and having ordered testing which has not been followed up on by a primary physician. My concern is that she hasnt scheduled follow up yet and I do not have a continuity clinic visit available until October so I think she should be scheduled for follow up with someone who has a continuity visit available sooner.   Thank you,  Ledell Noss    This message was sent to [employer] pool on 11/11/16.  It has been forwarded to [facility] to see if switching her pcp is a possibility.  Once a decision has been made patient will be scheduled for a future appointment.

## 2016-11-23 ENCOUNTER — Ambulatory Visit (HOSPITAL_COMMUNITY): Admission: RE | Admit: 2016-11-23 | Payer: Medicaid Other | Source: Ambulatory Visit

## 2016-11-24 ENCOUNTER — Ambulatory Visit (HOSPITAL_COMMUNITY)
Admission: RE | Admit: 2016-11-24 | Discharge: 2016-11-24 | Disposition: A | Discharge status: Home / Self Care | Payer: Medicaid Other | Source: Ambulatory Visit | Attending: Student in an Organized Health Care Education/Training Program | Admitting: Student in an Organized Health Care Education/Training Program

## 2016-11-24 DIAGNOSIS — I639 Cerebral infarction, unspecified: Secondary | ICD-10-CM | POA: Diagnosis not present

## 2016-11-24 DIAGNOSIS — G935 Compression of brain: Secondary | ICD-10-CM | POA: Insufficient documentation

## 2016-11-24 DIAGNOSIS — J341 Cyst and mucocele of nose and nasal sinus: Secondary | ICD-10-CM | POA: Diagnosis not present

## 2016-11-24 DIAGNOSIS — W19XXXA Unspecified fall, initial encounter: Secondary | ICD-10-CM

## 2016-11-25 DIAGNOSIS — I639 Cerebral infarction, unspecified: Secondary | ICD-10-CM | POA: Insufficient documentation

## 2016-11-25 MED ORDER — ASPIRIN EC 81 MG PO TBEC: 81.0000 mg | DELAYED_RELEASE_TABLET | Freq: Every day | ORAL | 3 refills | Status: AC

## 2016-11-25 NOTE — Addendum Note (Signed)
Addended by: Shela Leff on: 11/25/2016 11:20 AM   Modules accepted: Orders

## 2016-11-25 NOTE — Assessment & Plan Note (Addendum)
Brain MRI done 11/24/2016 showing a new non-acute right cerebellar infarct. Please read addendum from 11/25/2016 under falls.

## 2016-11-28 ENCOUNTER — Encounter: Payer: Self-pay | Admitting: Neurology

## 2016-12-02 ENCOUNTER — Ambulatory Visit (INDEPENDENT_AMBULATORY_CARE_PROVIDER_SITE_OTHER): Payer: Medicaid Other | Admitting: Neurology

## 2016-12-02 ENCOUNTER — Other Ambulatory Visit: Payer: Medicaid Other

## 2016-12-02 ENCOUNTER — Encounter: Payer: Self-pay | Admitting: Neurology

## 2016-12-02 VITALS — BP 142/78 | HR 60 | Ht 64.0 in | Wt 151.0 lb

## 2016-12-02 DIAGNOSIS — M542 Cervicalgia: Secondary | ICD-10-CM

## 2016-12-02 DIAGNOSIS — M545 Low back pain, unspecified: Secondary | ICD-10-CM

## 2016-12-02 DIAGNOSIS — G43009 Migraine without aura, not intractable, without status migrainosus: Secondary | ICD-10-CM

## 2016-12-02 DIAGNOSIS — R202 Paresthesia of skin: Secondary | ICD-10-CM

## 2016-12-02 DIAGNOSIS — I639 Cerebral infarction, unspecified: Secondary | ICD-10-CM

## 2016-12-02 DIAGNOSIS — R296 Repeated falls: Secondary | ICD-10-CM

## 2016-12-02 DIAGNOSIS — N96 Recurrent pregnancy loss: Secondary | ICD-10-CM

## 2016-12-02 DIAGNOSIS — G935 Compression of brain: Secondary | ICD-10-CM | POA: Diagnosis not present

## 2016-12-02 MED ORDER — GABAPENTIN 300 MG PO CAPS: ORAL_CAPSULE | ORAL | 6 refills | Status: DC

## 2016-12-02 NOTE — Patient Instructions (Addendum)
1. Bloodwork for hypercoagulable workup  Your provider has requested that you have labwork completed today. Please go to [facility] ([id number]) on the second floor of this building before leaving the office today. You do not need to check in. If you are not called within 15 minutes please check with the front desk.    2. Schedule MRI cervical, thoracic and lumbar spine without contrast  We have sent a referral to [facility] for your MRI and they will call you directly to schedule your appt. They are located at [place]. If you need to contact them directly please call [phone].   3. Schedule carotid dopplers 4. Schedule echocardiogram with bubble study 5. Increase gabapentin 300mg : Take 1 cap in AM, 2 caps in PM for 2 weeks, then increase to 2 caps twice a day 6. Monitor BP at home and with falls 7. Establish care with [facility] for the depression  [facility] is located at [place] [address].  Please contact them at [phone] to schedule your first appointment with them.  They do not require an appointment.   8. Follow-up in 3-4 months, call for any changes

## 2016-12-02 NOTE — Progress Notes (Signed)
NEUROLOGY CONSULTATION NOTE  Jill Hopkins MRN: [id number] DOB: 19-Jun-1970  Referring provider: Dr. Shela Leff Primary care provider: Dr. Kalman Shan  Reason for consult:  Frequent falls  Dear Dr Marlowe Sax:  Thank you for your kind referral of Jill Hopkins for consultation of the above symptoms. Although her history is well known to you, please allow me to reiterate it for the purpose of our medical record. The patient was accompanied to the clinic by her 51 year old daughter, Jill Hopkins and her mother who also provide collateral information. Records and images were personally reviewed where available.  HISTORY OF PRESENT ILLNESS: This is a pleasant 46 year old right-handed woman with a history of hypertension, depression, renal disorder, thyroid disease, presenting for evaluation of frequent falls. She states that falls started a year or so ago, but have increased in the past 6 months. She has a warning before she falls, she would start sweating, feel dizzy and nauseated, and can alert family. If she does not sit down, she would go down. She reports falling daily last weekend. Her daughter describes one of the falls 2 weeks ago where she alerted her daughter, asking for water, her daughter tried to hold her up then she fell and almost cracked her head on the corner of the kitchen table. She apparently passed out and daughter called for help, when patient's mother arrived, they tried to help her but she fell again in the hallway. She reports her balance is just not good, she sometimes just trips over something and can't stop herself from going down. She has a history of neck and back pain for many years, she has had tingling in her fingers, arms and knees down to her feet. She had been prescribed gabapentin 3-4 years ago but lost her insurance and was unable to restart gabapentin 300mg  BID until 2 months ago. No side effects on current dose. She has injured herself with cuts and bruises  on her back, neck, legs, and arms, but also she reports her pride is injured when she falls in front of someone. She would usually tell family she is okay and ask them to leave her alone.  She has a history of migraines for the past 10 years, worse in the last 7-8 months, with pain over the temporal and vertex region, associated nausea/vomiting, photo and phonophobia. Tylenol does not help. She has a history of depression and was restarted on Abilify and Wellbutrin last June. Her mother reports that she gets overexcited sometimes with mood swings, better since medications restarted. She has been unable to see her prior psychiatrist due to insurance change. She reports sleeping 12-14 hours at night, but sometimes from Friday to Sunday she would not sleep at all, her mind is running fast and there is "just a lot of it." She denies any alcohol or drug intake.   I personally reviewed MRI brain without contrast done 8/10/18which did not show any acute changes. There were new nonacute small right cerebellar infarcts not present on MRI in 2016. She denies any history of stroke. There is an increase in chronic microvascular disease. There is a mild Chiari malformation with cerebellar tonsils 67mm below foramen magnum, mildly pointed appearance on the right. 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 She reports this is why depression came about.   PAST MEDICAL HISTORY: Past Medical History:  Diagnosis Date  . Depression   . Hypertension   . Renal disorder   . Thyroid disease  PAST SURGICAL HISTORY: Past Surgical History:  Procedure Laterality Date  . CERVICAL CERCLAGE      MEDICATIONS: Current Outpatient Prescriptions on File Prior to Visit  Medication Sig Dispense Refill  . amLODipine (NORVASC) 10 MG tablet TAKE 1 TABLET BY MOUTH EVERY DAY 90 tablet 0  . ARIPiprazole (ABILIFY) 2 MG tablet TAKE 1 TABLET BY MOUTH EVERY DAY 30 tablet 0  . aspirin EC 81 MG tablet Take 1 tablet (81  mg total) by mouth daily. 90 tablet 3  . buPROPion (ZYBAN) 150 MG 12 hr tablet TAKE 1 TABLET BY MOUTH TWICE A DAY 180 tablet 0  . etonogestrel-ethinyl estradiol (NUVARING) 0.12-0.015 MG/24HR vaginal ring Insert vaginally and leave in place for 3 consecutive weeks, then remove for 1 week. 1 each 12  . ferrous sulfate 325 (65 FE) MG tablet Take 1 tablet (325 mg total) by mouth daily with breakfast. 60 tablet 11  . gabapentin (NEURONTIN) 300 MG capsule Take 1 capsule (300 mg total) by mouth 2 (two) times daily. 60 capsule 0  . levothyroxine (SYNTHROID, LEVOTHROID) 150 MCG tablet TAKE 1 TABLET BY MOUTH EVERY DAY 30 tablet 3  . metroNIDAZOLE (FLAGYL) 500 MG tablet Take 1 tablet (500 mg total) by mouth 2 (two) times daily. 14 tablet 2   No current facility-administered medications on file prior to visit.     ALLERGIES: Allergies  Allergen Reactions  . Ibuprofen Nausea And Vomiting    FAMILY HISTORY: Family History  Problem Relation Age of Onset  . Hypertension Mother     SOCIAL HISTORY: Social History   Social History  . Marital status: Single    Spouse name: N/A  . Number of children: 1  . Years of education: 69   Occupational History  . Not on file.   Social History Main Topics  . Smoking status: Former Smoker    Types: Cigarettes    Quit date: 08/17/2014  . Smokeless tobacco: Never Used  . Alcohol use No  . Drug use: No     Comment: Reports former substance use Hx, no current use  . Sexual activity: Yes    Partners: Male    Birth control/ protection: Inserts   Other Topics Concern  . Not on file   Social History Narrative   Lives at home with mother   Caffeine use: 1 cup tea per day    REVIEW OF SYSTEMS: Constitutional: No fevers, chills, or sweats, no generalized fatigue, change in appetite Eyes: No visual changes, double vision, eye pain Ear, nose and throat: No hearing loss, ear pain, nasal congestion, sore throat Cardiovascular: No chest pain,  palpitations Respiratory:  No shortness of breath at rest or with exertion, wheezes GastrointestinaI: No nausea, vomiting, diarrhea, abdominal pain, fecal incontinence Genitourinary:  No dysuria, urinary retention or frequency Musculoskeletal:  + neck pain, back pain Integumentary: No rash, pruritus, skin lesions Neurological: as above Psychiatric: + depression, insomnia, anxiety Endocrine: No palpitations, fatigue, diaphoresis, mood swings, change in appetite, change in weight, increased thirst Hematologic/Lymphatic:  No anemia, purpura, petechiae. Allergic/Immunologic: no itchy/runny eyes, nasal congestion, recent allergic reactions, rashes  PHYSICAL EXAM: Vitals:   12/02/16 1412  BP: (!) 142/78  Pulse: 60  SpO2: 99%   Orthostatic VS for the past 24 hrs (Last 3 readings):  BP- Lying Pulse- Lying BP- Sitting Pulse- Sitting BP- Standing at 0 minutes Pulse- Standing at 0 minutes  12/02/16 1427 128/76 57 118/80 60 112/76 62    General: No acute distress Head:  Normocephalic/atraumatic  Eyes: Fundoscopic exam shows bilateral sharp discs, no vessel changes, exudates, or hemorrhages Neck: supple, no paraspinal tenderness, full range of motion Back: No paraspinal tenderness Heart: regular rate and rhythm Lungs: Clear to auscultation bilaterally. Vascular: No carotid bruits. Skin/Extremities: No rash, no edema Neurological Exam: Mental status: alert and oriented to person, place, and time, no dysarthria or aphasia, Fund of knowledge is appropriate.  Recent and remote memory are intact.  Attention and concentration are normal.    Able to name objects and repeat phrases. Cranial nerves: CN I: not tested CN II: pupils equal, round and reactive to light, visual fields intact, fundi unremarkable. CN III, IV, VI:  full range of motion, no nystagmus, no ptosis CN V: facial sensation intact CN VII: upper and lower face symmetric CN VIII: hearing intact to finger rub CN IX, X: gag intact,  uvula midline CN XI: sternocleidomastoid and trapezius muscles intact CN XII: tongue midline Bulk & Tone: normal, no fasciculations. Motor: 5/5 throughout with no pronator drift. Sensation: intact to light touch, cold, pin, vibration and joint position sense.  No extinction to double simultaneous stimulation.  Romberg test negative Deep Tendon Reflexes: +2 throughout, no ankle clonus, negative Hoffman sign Plantar responses: downgoing bilaterally Cerebellar: no incoordination on finger to nose, heel to shin. No dysdiadochokinesia Gait: narrow-based and steady, able to tandem walk adequately. Tremor: none  IMPRESSION: This is a pleasant 46 year old right-handed woman with a history of hypertension, depression, renal disorder, thyroid disease, presenting for evaluation of frequent falls. She reports a prodrome of diaphoresis, nausea, and dizziness, suggestive of vasovagal syncope/presyncope. She appears to have labile BP. There is a 16-point drop in BP from supine to standing. She was instructed to monitor BP at home and after falls, continue follow-up with PCP on labile BP. With Chiari malformation and falls with neck/back pain and paresthesias, MRI cervical, thoracic, and lumbar spine will be ordered to rule out syrinx. Her MRI brain showed a nonacute right cerebellar stroke new from 2016, stroke workup will be ordered with carotid dopplers, echo, and hypercoagulable workup (history of 6 miscarriages). Continue aspirin and control of vascular risk factors. She was advised to establish care with [facility] as well for the depression. She will increase gabapentin to 600mg  BID for the migraines and paresthesias/neck/back pain. Follow-up in 3-4 months, she knows to call for any changes.   Thank you for allowing me to participate in the care of this patient. Please do not hesitate to call for any questions or concerns.   Ellouise Newer, M.D.  CC: Dr. Heber [place], Dr. Marlowe Sax

## 2016-12-02 NOTE — Addendum Note (Signed)
Addended by: Lenny Pastel on: 12/02/2016 03:53 PM   Modules accepted: Orders

## 2016-12-05 LAB — RFX PTT-LA W/RFX TO HEX PHASE CONF: PTT-LA Screen: 38 s (ref <=40)

## 2016-12-05 LAB — RFX DRVVT SCR W/RFLX CONF 1:1 MIX: dRVVT Screen: 31 s (ref <=45)

## 2016-12-06 LAB — HYPERCOAGULABLE PANEL, COMPREHENSIVE
ANTITHROMB III FUNC: 105 %{activity} (ref 80–120)
Anticardiolipin IgA: <11 [APL'U]
Anticardiolipin IgG: <14 [GPL'U]
Anticardiolipin IgM: <12 [MPL'U]
PROTEIN S ACTIVITY: 51 % — AB (ref 60–140)
PROTEIN S ANTIGEN, TOTAL: 64 % — AB (ref 70–140)
Protein C Activity: 87 % (ref 70–180)
Protein C Antigen: 92 % (ref 70–140)

## 2016-12-08 ENCOUNTER — Encounter: Payer: Medicaid Other | Admitting: Internal Medicine

## 2016-12-15 ENCOUNTER — Ambulatory Visit (INDEPENDENT_AMBULATORY_CARE_PROVIDER_SITE_OTHER): Payer: Medicaid Other | Admitting: Internal Medicine

## 2016-12-15 ENCOUNTER — Encounter: Payer: Self-pay | Admitting: Internal Medicine

## 2016-12-15 ENCOUNTER — Telehealth: Payer: Self-pay | Admitting: Neurology

## 2016-12-15 VITALS — BP 111/67 | HR 60 | Temp 97.8°F | Ht 64.0 in | Wt 163.0 lb

## 2016-12-15 DIAGNOSIS — F32A Depression, unspecified: Secondary | ICD-10-CM

## 2016-12-15 DIAGNOSIS — I10 Essential (primary) hypertension: Secondary | ICD-10-CM | POA: Diagnosis not present

## 2016-12-15 DIAGNOSIS — E039 Hypothyroidism, unspecified: Secondary | ICD-10-CM | POA: Diagnosis not present

## 2016-12-15 DIAGNOSIS — Z79899 Other long term (current) drug therapy: Secondary | ICD-10-CM | POA: Diagnosis not present

## 2016-12-15 DIAGNOSIS — F329 Major depressive disorder, single episode, unspecified: Secondary | ICD-10-CM | POA: Diagnosis present

## 2016-12-15 DIAGNOSIS — Z87891 Personal history of nicotine dependence: Secondary | ICD-10-CM | POA: Diagnosis not present

## 2016-12-15 DIAGNOSIS — E079 Disorder of thyroid, unspecified: Secondary | ICD-10-CM

## 2016-12-15 DIAGNOSIS — Z8673 Personal history of transient ischemic attack (TIA), and cerebral infarction without residual deficits: Secondary | ICD-10-CM

## 2016-12-15 DIAGNOSIS — I639 Cerebral infarction, unspecified: Secondary | ICD-10-CM

## 2016-12-15 DIAGNOSIS — Z7982 Long term (current) use of aspirin: Secondary | ICD-10-CM | POA: Diagnosis not present

## 2016-12-15 NOTE — Progress Notes (Signed)
   CC: Follow up on depression  HPI:  [name] is a 46 y.o. female with history noted below that presents to the Internal medicine clinic for follow up on depression.  Please see problem based charting for the status of patient's chronic medical conditions.   Past Medical History:  Diagnosis Date  . Depression   . Hypertension   . Renal disorder   . Thyroid disease     Review of Systems:  Review of Systems  Respiratory: Negative for shortness of breath.   Cardiovascular: Negative for chest pain.  Musculoskeletal: Negative for falls.  Neurological: Negative for dizziness.     Physical Exam:  Vitals:   12/15/16 1523  BP: 111/67  Pulse: 60  Temp: 97.8 F (36.6 C)  TempSrc: Oral  SpO2: 100%  Weight: 163 lb (73.9 kg)  Height: 5\' 4"  (1.626 m)   Physical Exam  Constitutional: She is well-developed, well-nourished, and in no distress.  Cardiovascular: Normal rate, regular rhythm and normal heart sounds.  Exam reveals no gallop and no friction rub.   No murmur heard. Pulmonary/Chest: Effort normal and breath sounds normal. No respiratory distress. She has no wheezes. She has no rales.    Assessment & Plan:   See encounters tab for problem based medical decision making.    Patient discussed with Dr. Dareen Piano

## 2016-12-15 NOTE — Telephone Encounter (Signed)
Kylie from [facility] called and said pt has an appointment for an ultrasound tomorrow and they do need a pre cert CB# [phone]

## 2016-12-15 NOTE — Patient Instructions (Addendum)
Jill Hopkins,  Please follow up in one month.  A referral to psychiatry has been made.

## 2016-12-16 ENCOUNTER — Ambulatory Visit (HOSPITAL_COMMUNITY): Payer: Medicaid Other

## 2016-12-16 ENCOUNTER — Other Ambulatory Visit (HOSPITAL_COMMUNITY): Payer: Medicaid Other

## 2016-12-19 MED ORDER — ROSUVASTATIN CALCIUM 20 MG PO TABS: 20.0000 mg | ORAL_TABLET | Freq: Every day | ORAL | 2 refills | Status: DC

## 2016-12-19 NOTE — Assessment & Plan Note (Signed)
Assessment: Essential hypertension Patient currently takes amlodipine 10mg  daily, Today's blood pressure is 111/67.   Plan -continue amlodipine

## 2016-12-19 NOTE — Assessment & Plan Note (Signed)
Assessment:  History of CVA Jill Hopkins MRI on 11/24/2016 shows a new non-acute right cerebellar infarct.  Patient states she is taking aspirin daily.  Will had rosuvastatin 20mg  daily.  Plan -rosuvastatin 20mg  daily

## 2016-12-19 NOTE — Assessment & Plan Note (Signed)
Assessment: Depression On 09/23/2016 her PHQ-9 was 20.  She was restarted on bupropion 150mg  BID on that visit.  On 10/27/2016 aripiprazole 2mg  daily was added to her regimen.  On her neurology visit on 12/02/2016 it was recommended that she re-establish care with [facility] and patient states she went once but does not feel comfortable going there for regular follow ups.  Today PHQ-9 is 24.  When looking at previous clinic visits it is noted that patient becomes irritable and angry.  Today patient is agreeable and pleasant.  Per daughter patient's mood has improved recently.  I will not make medication changes at this time as I believe her mood is better with buproprion and aripiprazole but will refer her to psychiatry.    Plan - psychiatry referral

## 2016-12-19 NOTE — Assessment & Plan Note (Signed)
Assessment: Hypothyroidism Patient's TSH was 82 on 09/23/2016 and she was restarted on levothyroxine 150 MCG daily as she was off her levothyroxin for over a year at that time.  Repeat TSH in October 27, 2016 was 0.973.  Plan - continue levothyroxine 172mcg

## 2016-12-20 ENCOUNTER — Encounter (HOSPITAL_COMMUNITY): Payer: Medicaid Other

## 2016-12-20 NOTE — Progress Notes (Signed)
Internal Medicine Clinic Attending  Case discussed with Dr. Hoffman at the time of the visit.  We reviewed the resident's history and exam and pertinent patient test results.  I agree with the assessment, diagnosis, and plan of care documented in the resident's note.  

## 2016-12-21 LAB — LIPID PANEL

## 2016-12-22 ENCOUNTER — Telehealth: Payer: Self-pay | Admitting: Internal Medicine

## 2016-12-22 NOTE — Telephone Encounter (Signed)
Pt requesting a call back regarding her medicines

## 2016-12-23 ENCOUNTER — Ambulatory Visit
Admission: RE | Admit: 2016-12-23 | Discharge: 2016-12-23 | Disposition: A | Discharge status: Home / Self Care | Payer: Medicaid Other | Source: Ambulatory Visit | Attending: Neurology | Admitting: Neurology

## 2016-12-23 ENCOUNTER — Telehealth (HOSPITAL_COMMUNITY): Payer: Self-pay | Admitting: Neurology

## 2016-12-23 DIAGNOSIS — R202 Paresthesia of skin: Secondary | ICD-10-CM

## 2016-12-23 DIAGNOSIS — M542 Cervicalgia: Secondary | ICD-10-CM

## 2016-12-23 NOTE — Telephone Encounter (Signed)
12/08/2016 LMOM for pt to call and schedule Echo appt. stpegram  12/22/16 cellphone number can not accept calls evd  12/23/16 Called pt and phone went straight to VM and lmsg for her to CB to r/s echo. ..RG  User: Cherie Dark A Date/time: 12/23/2016 2:23 PM  Comment: Called pt and phone went straight to VM and lmsg for her to CB to r/s echo. ..RG  Context: [place] Schedule Orders/Appt Requests Outcome: Left Message  Phone number: [phone] Phone Type: Home Phone  Comm. type: Telephone Call type: Outgoing  Contact: Mellody Dance Relation to patient: Self  Letter:      User: Cherie Dark A Date/time: 12/12/2016 2:57 PM  Comment: Called pt and lmsg for her to CB to sch echo.   Context: [place] Schedule Orders/Appt Requests Outcome: Left Message  Phone number: [phone] Phone Type: Home Phone  Comm. type: Telephone Call type: Outgoing  Contact: Mellody Dance Relation to patient: Self  Letter:

## 2016-12-26 ENCOUNTER — Other Ambulatory Visit: Payer: Self-pay | Admitting: Internal Medicine

## 2016-12-26 NOTE — Telephone Encounter (Signed)
Called thurs no answer

## 2016-12-29 ENCOUNTER — Telehealth: Payer: Self-pay

## 2016-12-29 NOTE — Telephone Encounter (Signed)
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 This is likely the cause of her poor balance, neck pain, and numbness and tingling. Recommend Neurosurgery referral, pls send referral. Thanks

## 2016-12-29 NOTE — Telephone Encounter (Signed)
Spoke with pt, relaying message below.  Will send referral now.

## 2017-01-11 ENCOUNTER — Other Ambulatory Visit: Payer: Self-pay | Admitting: Internal Medicine

## 2017-01-11 DIAGNOSIS — F332 Major depressive disorder, recurrent severe without psychotic features: Secondary | ICD-10-CM

## 2017-01-19 ENCOUNTER — Ambulatory Visit (INDEPENDENT_AMBULATORY_CARE_PROVIDER_SITE_OTHER): Payer: Medicaid Other | Admitting: Internal Medicine

## 2017-01-19 ENCOUNTER — Encounter: Payer: Self-pay | Admitting: Internal Medicine

## 2017-01-19 VITALS — BP 125/81 | HR 76 | Temp 97.9°F | Ht 64.0 in | Wt 165.1 lb

## 2017-01-19 DIAGNOSIS — M5020 Other cervical disc displacement, unspecified cervical region: Secondary | ICD-10-CM

## 2017-01-19 NOTE — Patient Instructions (Addendum)
Jill Hopkins,  Please tell your dentist office to fax over paper work to fill out at [phone] Please bring paper work by noon tomorrow to be filled out

## 2017-01-19 NOTE — Progress Notes (Signed)
   CC: bilateral arm tingling  HPI:  [name] is a 46 y.o. female with history noted below that presents to the Internal Medicine Clinic for bilateral arm tingling that first started 2 years ago but states that the tingling has recently gotten worse.  She denies muscle pain, falls, weakness, fever/chills.  She has tried medication in the past but can not recall her regimen for her symptoms.    Past Medical History:  Diagnosis Date  . Depression   . Hypertension   . Renal disorder   . Thyroid disease     Review of Systems:  Review of Systems  Constitutional: Negative for malaise/fatigue.  Respiratory: Negative for shortness of breath.   Cardiovascular: Negative for chest pain and palpitations.  Musculoskeletal: Negative for myalgias and neck pain.  Neurological: Negative for sensory change, speech change and weakness.     Physical Exam:  Vitals:   01/19/17 1622  BP: 125/81  Pulse: 76  Temp: 97.9 F (36.6 C)  TempSrc: Oral  SpO2: 100%  Weight: 165 lb 1.6 oz (74.9 kg)  Height: 5\' 4"  (1.626 m)   Physical Exam  Constitutional: She is well-developed, well-nourished, and in no distress.  Cardiovascular: Normal rate, regular rhythm and normal heart sounds.  Exam reveals no gallop and no friction rub.   No murmur heard. Pulmonary/Chest: Effort normal and breath sounds normal. No respiratory distress. She has no wheezes. She has no rales.  Neurological:  5/5 motor strength in upper and lower extremities Sensation intact in upper and lower extermities CN II-XII grossly intact   Skin: Skin is warm and dry.    Assessment & Plan:   See encounters tab for problem based medical decision making.   Patient discussed with Dr. Daryll Drown

## 2017-01-21 ENCOUNTER — Other Ambulatory Visit: Payer: Self-pay | Admitting: Internal Medicine

## 2017-01-24 DIAGNOSIS — M5020 Other cervical disc displacement, unspecified cervical region: Secondary | ICD-10-CM | POA: Insufficient documentation

## 2017-01-24 NOTE — Assessment & Plan Note (Signed)
Assessment:  Bilateral arm tingling  Patient presents with a two year history of bilateral arm tingling that has recently gotten worse.  She had an MR of cervical spine on 12/23/16 that showed slightly worsening of disease at C5-6 level. 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 This effaces the subarachnoid space and indents the cord. This may be explaining patient's symptoms.  Will refer to neurosurgery  Plan -neurosurgery referral

## 2017-01-24 NOTE — Assessment & Plan Note (Deleted)
Assessment:  Bilateral arm tingling Patient presents with a two year history of bilateral arm tingling that has recently gotten worse.  She had an MR of cervical spine on 12/23/16 that showed slightly worsening of disease at C5-6 level. 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 This effaces the subarachnoid space and indents the cord. This may be explaining patient's symptoms.  Will refer to neurosurgery  Plan -neurosurgery referral

## 2017-01-25 NOTE — Progress Notes (Signed)
Internal Medicine Clinic Attending  Case discussed with Dr. Hoffman at the time of the visit.  We reviewed the resident's history and exam and pertinent patient test results.  I agree with the assessment, diagnosis, and plan of care documented in the resident's note.  

## 2017-01-27 ENCOUNTER — Telehealth: Payer: Self-pay

## 2017-01-27 NOTE — Telephone Encounter (Signed)
Faxed [facility] form @ [phone] on 01/27/2017.

## 2017-01-31 ENCOUNTER — Encounter: Payer: Self-pay | Admitting: Internal Medicine

## 2017-01-31 ENCOUNTER — Ambulatory Visit (INDEPENDENT_AMBULATORY_CARE_PROVIDER_SITE_OTHER): Payer: Medicaid Other | Admitting: Internal Medicine

## 2017-01-31 VITALS — BP 141/81 | HR 56 | Temp 97.9°F | Ht 64.0 in | Wt 167.4 lb

## 2017-01-31 DIAGNOSIS — R2232 Localized swelling, mass and lump, left upper limb: Secondary | ICD-10-CM | POA: Diagnosis not present

## 2017-01-31 DIAGNOSIS — F332 Major depressive disorder, recurrent severe without psychotic features: Secondary | ICD-10-CM

## 2017-01-31 DIAGNOSIS — N63 Unspecified lump in unspecified breast: Secondary | ICD-10-CM | POA: Insufficient documentation

## 2017-01-31 DIAGNOSIS — F339 Major depressive disorder, recurrent, unspecified: Secondary | ICD-10-CM

## 2017-01-31 DIAGNOSIS — M7989 Other specified soft tissue disorders: Secondary | ICD-10-CM

## 2017-01-31 DIAGNOSIS — N6311 Unspecified lump in the right breast, upper outer quadrant: Secondary | ICD-10-CM | POA: Diagnosis present

## 2017-01-31 MED ORDER — ARIPIPRAZOLE 2 MG PO TABS: 2.0000 mg | ORAL_TABLET | Freq: Every day | ORAL | 2 refills | Status: DC

## 2017-01-31 NOTE — Progress Notes (Signed)
   CC: breast mass  HPI:  [name] is a 46 y.o. female with past medical history outlined below here for breast mass. For the details of today's visit, please refer to the assessment and plan.  Past Medical History:  Diagnosis Date  . Depression   . Hypertension   . Renal disorder   . Thyroid disease     Review of Systems  Constitutional: Negative for fever and weight loss.     Physical Exam:  Vitals:   01/31/17 1514  BP: (!) 141/81  Pulse: (!) 56  Temp: 97.9 F (36.6 C)  TempSrc: Oral  SpO2: 100%  Weight: 167 lb 6.4 oz (75.9 kg)  Height: 5\' 4"  (1.626 m)    Constitutional: NAD, appears comfortable Cardiovascular: RRR, no murmurs, rubs, or gallops.  Pulmonary/Chest: CTAB, no wheezes, rales, or rhonchi.  Breast: 2-3 cm mobile mass in R breast, non tender, overlying skin appears normal.  Extremities: Warm and well perfused. No edema. Left upper extremity with two ~1cm mobile nodules.  Psychiatric: Normal mood and affect  Assessment & Plan:   See Encounters Tab for problem based charting.  Patient discussed with Dr. Lynnae January

## 2017-01-31 NOTE — Patient Instructions (Signed)
Jill Hopkins,  I have ordered an ultrasound of your breast and left arm. You will be called to schedule this. As soon as I have the results from the study, I will give you a call. If you have any questions or concerns, call our clinic at [phone] or after hours call [phone] and ask for the internal medicine resident on call. Thank you!  - Dr. Philipp Ovens

## 2017-02-01 ENCOUNTER — Telehealth: Payer: Self-pay | Admitting: *Deleted

## 2017-02-01 NOTE — Telephone Encounter (Signed)
Call to [facility] for PA for Aripiprazole 2 mg tablets approved 02/01/2017 thru 01/27/2018.  [place] [phone].  Sander Nephew, RN 02/01/2017 10:30 AM.

## 2017-02-01 NOTE — Assessment & Plan Note (Signed)
Refilled Abilify.

## 2017-02-01 NOTE — Assessment & Plan Note (Addendum)
Patient is here with complaint of a breast mass that she first noticed 5 days ago. On exam there is an approximately 2-3 cm mobile, non tender nodule on her right breast. It is unchanged in size over the past few days. The overlying skin appears normal. Patient had a screening mammogram in July 2018 that was read as BI-RADS Category 1. Nodule is likely a benign cyst versus fibroadenoma.  -- F/u Ultrasound

## 2017-02-01 NOTE — Assessment & Plan Note (Signed)
On exam today, patient is complaining of 2 hard nodules on the lateral aspect of her left upper extremity approximately 1 cm in diameter. She had similar nodules in her right arm back in June of this year that resolved without intervention. At that time, an ultrasound had been ordered for further workup however patient never had this done. She has now noticed new nodules on her left upper extremity. Unclear etiology. They do not appear to be in a lymphatic territory but are about the size of a lymph node. They are firm to palpation and semi mobile, non tender.  -- F/u ultrasound

## 2017-02-03 NOTE — Progress Notes (Signed)
Internal Medicine Clinic Attending  Case discussed with Dr. Guilloud at the time of the visit.  We reviewed the resident's history and exam and pertinent patient test results.  I agree with the assessment, diagnosis, and plan of care documented in the resident's note.  

## 2017-02-06 ENCOUNTER — Other Ambulatory Visit: Payer: Self-pay | Admitting: Internal Medicine

## 2017-02-06 DIAGNOSIS — N63 Unspecified lump in unspecified breast: Secondary | ICD-10-CM

## 2017-02-13 ENCOUNTER — Other Ambulatory Visit: Payer: Self-pay | Admitting: Internal Medicine

## 2017-02-13 DIAGNOSIS — I10 Essential (primary) hypertension: Secondary | ICD-10-CM

## 2017-02-21 ENCOUNTER — Other Ambulatory Visit: Payer: Self-pay | Admitting: Internal Medicine

## 2017-02-21 DIAGNOSIS — F332 Major depressive disorder, recurrent severe without psychotic features: Secondary | ICD-10-CM

## 2017-02-22 NOTE — Telephone Encounter (Signed)
Needs to follow up with psychiatry, will give one supply

## 2017-03-01 ENCOUNTER — Other Ambulatory Visit: Payer: Medicaid Other

## 2017-03-08 ENCOUNTER — Ambulatory Visit (HOSPITAL_COMMUNITY): Admission: RE | Admit: 2017-03-08 | Payer: Medicaid Other | Source: Ambulatory Visit

## 2017-03-22 ENCOUNTER — Ambulatory Visit: Payer: Medicaid Other | Admitting: Neurology

## 2017-04-17 ENCOUNTER — Other Ambulatory Visit: Payer: Medicaid Other

## 2017-04-20 ENCOUNTER — Telehealth: Payer: Self-pay

## 2017-04-20 NOTE — Telephone Encounter (Signed)
Received fax from [facility] letting us know that referral is now inactive, as they have tried to reach pt on 10/09 and 11/3 - messages were left and pt never returned their call.

## 2017-04-21 ENCOUNTER — Telehealth: Payer: Self-pay | Admitting: Neurology

## 2017-04-21 NOTE — Telephone Encounter (Signed)
error 

## 2017-05-01 ENCOUNTER — Other Ambulatory Visit: Payer: Self-pay | Admitting: Internal Medicine

## 2017-05-01 DIAGNOSIS — F332 Major depressive disorder, recurrent severe without psychotic features: Secondary | ICD-10-CM

## 2017-05-25 ENCOUNTER — Ambulatory Visit (INDEPENDENT_AMBULATORY_CARE_PROVIDER_SITE_OTHER): Payer: Medicaid Other | Admitting: Obstetrics

## 2017-05-25 ENCOUNTER — Encounter: Payer: Self-pay | Admitting: Obstetrics

## 2017-05-25 ENCOUNTER — Other Ambulatory Visit (HOSPITAL_COMMUNITY)
Admission: RE | Admit: 2017-05-25 | Discharge: 2017-05-25 | Disposition: A | Discharge status: Home / Self Care | Payer: Medicaid Other | Source: Ambulatory Visit | Attending: Obstetrics | Admitting: Obstetrics

## 2017-05-25 VITALS — BP 140/93 | HR 55 | Wt 167.9 lb

## 2017-05-25 DIAGNOSIS — B9689 Other specified bacterial agents as the cause of diseases classified elsewhere: Secondary | ICD-10-CM

## 2017-05-25 DIAGNOSIS — Z113 Encounter for screening for infections with a predominantly sexual mode of transmission: Secondary | ICD-10-CM | POA: Insufficient documentation

## 2017-05-25 DIAGNOSIS — L0293 Carbuncle, unspecified: Secondary | ICD-10-CM

## 2017-05-25 DIAGNOSIS — E663 Overweight: Secondary | ICD-10-CM

## 2017-05-25 DIAGNOSIS — Z01419 Encounter for gynecological examination (general) (routine) without abnormal findings: Secondary | ICD-10-CM | POA: Diagnosis not present

## 2017-05-25 DIAGNOSIS — I10 Essential (primary) hypertension: Secondary | ICD-10-CM

## 2017-05-25 DIAGNOSIS — N760 Acute vaginitis: Secondary | ICD-10-CM

## 2017-05-25 DIAGNOSIS — Z1239 Encounter for other screening for malignant neoplasm of breast: Secondary | ICD-10-CM

## 2017-05-25 DIAGNOSIS — Z0000 Encounter for general adult medical examination without abnormal findings: Secondary | ICD-10-CM | POA: Diagnosis not present

## 2017-05-25 MED ORDER — PRENATE MINI 18-0.6-0.4-350 MG PO CAPS: 1.0000 | ORAL_CAPSULE | Freq: Every day | ORAL | 11 refills | Status: DC

## 2017-05-25 MED ORDER — SECNIDAZOLE 2 G PO PACK: 1.0000 | PACK | Freq: Once | ORAL | 2 refills | Status: AC

## 2017-05-25 MED ORDER — CLINDAMYCIN PHOSPHATE 1 % EX SOLN: Freq: Two times a day (BID) | CUTANEOUS | 2 refills | Status: DC

## 2017-05-25 NOTE — Progress Notes (Signed)
Subjective:        Jill Hopkins is a 47 y.o. female here for a routine exam.  Current complaints: Hard, tender bumps in pubic area..    Personal health questionnaire:  Is patient Ashkenazi Jewish, have a family history of breast and/or ovarian cancer: no Is there a family history of uterine cancer diagnosed at age < 57, gastrointestinal cancer, urinary tract cancer, family member who is a Field seismologist syndrome-associated carrier: no Is the patient overweight and hypertensive, family history of diabetes, personal history of gestational diabetes, preeclampsia or PCOS: no Is patient over 6, have PCOS,  family history of premature CHD under age 52, diabetes, smoke, have hypertension or peripheral artery disease:  no At any time, has a partner hit, kicked or otherwise hurt or frightened you?: no Over the past 2 weeks, have you felt down, depressed or hopeless?: no Over the past 2 weeks, have you felt little interest or pleasure in doing things?:no   Gynecologic History Patient's last menstrual period was 04/22/2016. Contraception: NuvaRing vaginal inserts Last Pap: 2018. Results were: normal Last mammogram: 2018. Results were: normal  Obstetric History OB History  No data available    Past Medical History:  Diagnosis Date  . Depression   . Hypertension   . Renal disorder   . Thyroid disease     Past Surgical History:  Procedure Laterality Date  . CERVICAL CERCLAGE       Current Outpatient Medications:  .  amLODipine (NORVASC) 10 MG tablet, TAKE 1 TABLET BY MOUTH EVERY DAY, Disp: 90 tablet, Rfl: 4 .  ARIPiprazole (ABILIFY) 2 MG tablet, Take 1 tablet (2 mg total) by mouth daily., Disp: 30 tablet, Rfl: 2 .  aspirin EC 81 MG tablet, Take 1 tablet (81 mg total) by mouth daily., Disp: 90 tablet, Rfl: 3 .  buPROPion (WELLBUTRIN SR) 150 MG 12 hr tablet, TAKE 1 TABLET 2 TIMES DAILY BY MOUTH., Disp: 60 tablet, Rfl: 1 .  buPROPion (ZYBAN) 150 MG 12 hr tablet, TAKE 1 TABLET BY MOUTH TWICE  A DAY, Disp: 180 tablet, Rfl: 0 .  etonogestrel-ethinyl estradiol (NUVARING) 0.12-0.015 MG/24HR vaginal ring, Insert vaginally and leave in place for 3 consecutive weeks, then remove for 1 week., Disp: 1 each, Rfl: 12 .  ferrous sulfate 325 (65 FE) MG tablet, Take 1 tablet (325 mg total) by mouth daily with breakfast., Disp: 60 tablet, Rfl: 11 .  gabapentin (NEURONTIN) 300 MG capsule, Take 1 cap in AM, 2 caps in PM for 2 weeks, then increase to 2 caps twice a day, Disp: 120 capsule, Rfl: 6 .  levothyroxine (SYNTHROID, LEVOTHROID) 150 MCG tablet, TAKE 1 TABLET BY MOUTH EVERY DAY, Disp: 30 tablet, Rfl: 3 .  metroNIDAZOLE (FLAGYL) 500 MG tablet, Take 1 tablet (500 mg total) by mouth 2 (two) times daily., Disp: 14 tablet, Rfl: 2 .  Prenat-FeCbn-FeAsp-Meth-FA-DHA (PRENATE MINI) 18-0.6-0.4-350 MG CAPS, Take 1 capsule by mouth daily before breakfast., Disp: 30 capsule, Rfl: 11 .  rosuvastatin (CRESTOR) 20 MG tablet, Take 1 tablet (20 mg total) by mouth daily., Disp: 30 tablet, Rfl: 2 .  clindamycin (CLEOCIN-T) 1 % external solution, Apply topically 2 (two) times daily., Disp: 60 mL, Rfl: 2 .  Secnidazole (SOLOSEC) 2 g PACK, Take 1 packet by mouth once for 1 dose. Mix as directed with yogurt, pudding, applesauce, etc., Disp: 1 each, Rfl: 2 Allergies  Allergen Reactions  . Ibuprofen Nausea And Vomiting    Social History   Tobacco Use  . Smoking  status: Former Smoker    Types: Cigarettes    Last attempt to quit: 08/17/2014    Years since quitting: 2.7  . Smokeless tobacco: Never Used  Substance Use Topics  . Alcohol use: No    Alcohol/week: 0.0 oz    Family History  Problem Relation Age of Onset  . Hypertension Mother       Review of Systems  Constitutional: negative for fatigue and weight loss Respiratory: negative for cough and wheezing Cardiovascular: negative for chest pain, fatigue and palpitations Gastrointestinal: negative for abdominal pain and change in bowel  habits Musculoskeletal:negative for myalgias Neurological: negative for gait problems and tremors Behavioral/Psych: negative for abusive relationship, depression Endocrine: negative for temperature intolerance    Genitourinary:negative for abnormal menstrual periods, genital lesions, hot flashes, sexual problems and vaginal discharge Integument/breast: negative for breast lump, breast tenderness, nipple discharge and skin lesion(s)    Objective:       BP (!) 140/93   Pulse (!) 55   Wt 167 lb 14.4 oz (76.2 kg)   LMP 04/22/2016   BMI 28.82 kg/m  General:   alert  Skin:   no rash or abnormalities  Lungs:   clear to auscultation bilaterally  Heart:   regular rate and rhythm, S1, S2 normal, no murmur, click, rub or gallop  Breasts:   normal without suspicious masses, skin or nipple changes or axillary nodes  Abdomen:  normal findings: no organomegaly, soft, non-tender and no hernia  Pelvis:  External genitalia: normal general appearance Urinary system: urethral meatus normal and bladder without fullness, nontender Vaginal: normal without tenderness, induration or masses Cervix: normal appearance Adnexa: normal bimanual exam Uterus: anteverted and non-tender, normal size   Lab Review Urine pregnancy test Labs reviewed yes Radiologic studies reviewed yes  50% of 20 min visit spent on counseling and coordination of care.   Assessment:     1. Encounter for gynecological examination Rx: - Prenat-FeCbn-FeAsp-Meth-FA-DHA (PRENATE MINI) 18-0.6-0.4-350 MG CAPS; Take 1 capsule by mouth daily before breakfast.  Dispense: 30 capsule; Refill: 11  2. BV (bacterial vaginosis) Rx: - Secnidazole (SOLOSEC) 2 g PACK; Take 1 packet by mouth once for 1 dose. Mix as directed with yogurt, pudding, applesauce, etc.  Dispense: 1 each; Refill: 2  3. Carbuncle of skin and/or subcutaneous tissue Rx: - clindamycin (CLEOCIN-T) 1 % external solution; Apply topically 2 (two) times daily.  Dispense: 60  mL; Refill: 2  4. HTN (hypertension), benign - managed by PCP  5. Screening for STD (sexually transmitted disease) Rx: - HIV antibody - Hepatitis B surface antigen - RPR - Hepatitis C antibody  6. Routine adult health maintenance Rx: - Prenat-FeCbn-FeAsp-Meth-FA-DHA (PRENATE MINI) 18-0.6-0.4-350 MG CAPS; Take 1 capsule by mouth daily before breakfast.  Dispense: 30 capsule; Refill: 11  7. Screening breast examination  8. Overweight (BMI 25.0-29.9) - program of caloric reduction, exercise and behavioral modification recommended    Plan:    Education reviewed: calcium supplements, depression evaluation, low fat, low cholesterol diet, safe sex/STD prevention, self breast exams and weight bearing exercise. Contraception: NuvaRing vaginal inserts. Follow up in: 1 year.   Meds ordered this encounter  Medications  . Secnidazole (SOLOSEC) 2 g PACK    Sig: Take 1 packet by mouth once for 1 dose. Mix as directed with yogurt, pudding, applesauce, etc.    Dispense:  1 each    Refill:  2  . Prenat-FeCbn-FeAsp-Meth-FA-DHA (PRENATE MINI) 18-0.6-0.4-350 MG CAPS    Sig: Take 1 capsule by mouth daily before  breakfast.    Dispense:  30 capsule    Refill:  11  . clindamycin (CLEOCIN-T) 1 % external solution    Sig: Apply topically 2 (two) times daily.    Dispense:  60 mL    Refill:  2   Orders Placed This Encounter  Procedures  . HIV antibody  . Hepatitis B surface antigen  . RPR  . Hepatitis C antibody    Shelly Bombard MD

## 2017-05-25 NOTE — Progress Notes (Signed)
Patient is in the office, reports 4-5 hard, painful bumps on pubic area that came up about a week ago. Pt states that she did not take BP med yet.

## 2017-05-26 LAB — CERVICOVAGINAL ANCILLARY ONLY
BACTERIAL VAGINITIS: NEGATIVE
CANDIDA VAGINITIS: NEGATIVE
Chlamydia: NEGATIVE
NEISSERIA GONORRHEA: NEGATIVE
TRICH (WINDOWPATH): POSITIVE — AB

## 2017-05-26 LAB — RPR: RPR Ser Ql: NONREACTIVE

## 2017-05-26 LAB — HEPATITIS B SURFACE ANTIGEN: Hepatitis B Surface Ag: NEGATIVE

## 2017-05-26 LAB — HIV ANTIBODY (ROUTINE TESTING W REFLEX): HIV Screen 4th Generation wRfx: NONREACTIVE

## 2017-05-26 LAB — HEPATITIS C ANTIBODY: Hep C Virus Ab: <0.1 s/co ratio (ref 0.0–0.9)

## 2017-05-27 ENCOUNTER — Other Ambulatory Visit: Payer: Self-pay | Admitting: Obstetrics

## 2017-05-27 DIAGNOSIS — N760 Acute vaginitis: Principal | ICD-10-CM

## 2017-05-27 DIAGNOSIS — B9689 Other specified bacterial agents as the cause of diseases classified elsewhere: Secondary | ICD-10-CM

## 2017-05-27 MED ORDER — METRONIDAZOLE 500 MG PO TABS: 2000.0000 mg | ORAL_TABLET | Freq: Once | ORAL | 0 refills | Status: AC

## 2017-06-02 ENCOUNTER — Telehealth: Payer: Self-pay

## 2017-06-02 NOTE — Telephone Encounter (Signed)
Advised of results and rx sent by provider.

## 2017-06-08 ENCOUNTER — Other Ambulatory Visit: Payer: Self-pay | Admitting: Internal Medicine

## 2017-06-08 DIAGNOSIS — E079 Disorder of thyroid, unspecified: Secondary | ICD-10-CM

## 2017-06-08 DIAGNOSIS — F332 Major depressive disorder, recurrent severe without psychotic features: Secondary | ICD-10-CM

## 2017-07-15 ENCOUNTER — Other Ambulatory Visit: Payer: Self-pay | Admitting: Neurology

## 2017-07-15 ENCOUNTER — Other Ambulatory Visit: Payer: Self-pay | Admitting: Internal Medicine

## 2017-07-15 DIAGNOSIS — M545 Low back pain, unspecified: Secondary | ICD-10-CM

## 2017-07-15 DIAGNOSIS — G43009 Migraine without aura, not intractable, without status migrainosus: Secondary | ICD-10-CM

## 2017-07-15 DIAGNOSIS — M542 Cervicalgia: Secondary | ICD-10-CM

## 2017-07-15 DIAGNOSIS — R202 Paresthesia of skin: Secondary | ICD-10-CM

## 2017-07-15 DIAGNOSIS — F332 Major depressive disorder, recurrent severe without psychotic features: Secondary | ICD-10-CM

## 2017-08-04 ENCOUNTER — Emergency Department (HOSPITAL_COMMUNITY)
Admission: EM | Admit: 2017-08-04 | Discharge: 2017-08-04 | Disposition: A | Discharge status: Home / Self Care | Payer: Medicaid Other | Attending: Emergency Medicine | Admitting: Emergency Medicine

## 2017-08-04 ENCOUNTER — Other Ambulatory Visit: Payer: Self-pay

## 2017-08-04 ENCOUNTER — Encounter (HOSPITAL_COMMUNITY): Payer: Self-pay | Admitting: Emergency Medicine

## 2017-08-04 DIAGNOSIS — N179 Acute kidney failure, unspecified: Secondary | ICD-10-CM | POA: Diagnosis not present

## 2017-08-04 DIAGNOSIS — Z79899 Other long term (current) drug therapy: Secondary | ICD-10-CM | POA: Diagnosis not present

## 2017-08-04 DIAGNOSIS — R569 Unspecified convulsions: Secondary | ICD-10-CM | POA: Diagnosis not present

## 2017-08-04 DIAGNOSIS — E079 Disorder of thyroid, unspecified: Secondary | ICD-10-CM

## 2017-08-04 DIAGNOSIS — Z87891 Personal history of nicotine dependence: Secondary | ICD-10-CM | POA: Insufficient documentation

## 2017-08-04 DIAGNOSIS — I10 Essential (primary) hypertension: Secondary | ICD-10-CM | POA: Diagnosis not present

## 2017-08-04 HISTORY — DX: Unspecified convulsions: R56.9

## 2017-08-04 LAB — TSH: TSH: 45.759 u[IU]/mL — AB (ref 0.350–4.500)

## 2017-08-04 LAB — CBG MONITORING, ED: GLUCOSE-CAPILLARY: 77 mg/dL (ref 65–99)

## 2017-08-04 LAB — I-STAT BETA HCG BLOOD, ED (MC, WL, AP ONLY)

## 2017-08-04 LAB — T4, FREE: Free T4: 0.45 ng/dL — ABNORMAL LOW (ref 0.61–1.12)

## 2017-08-04 MED ORDER — LEVETIRACETAM 500 MG PO TABS: 500.0000 mg | ORAL_TABLET | Freq: Two times a day (BID) | ORAL | 0 refills | Status: DC

## 2017-08-04 MED ORDER — LEVETIRACETAM IN NACL 500 MG/100ML IV SOLN
500.0000 mg | Freq: Two times a day (BID) | INTRAVENOUS | Status: DC
  Administered 2017-08-04: 500 mg via INTRAVENOUS
  Filled 2017-08-04: qty 100

## 2017-08-04 MED ORDER — TRAMADOL HCL 50 MG PO TABS
50.0000 mg | ORAL_TABLET | Freq: Once | ORAL | Status: AC
  Administered 2017-08-04: 50 mg via ORAL
  Filled 2017-08-04: qty 1

## 2017-08-04 MED ORDER — LEVOTHYROXINE SODIUM 150 MCG PO TABS: 150.0000 ug | ORAL_TABLET | Freq: Every day | ORAL | 0 refills | Status: DC

## 2017-08-04 MED ORDER — SODIUM CHLORIDE 0.9 % IV BOLUS
1000.0000 mL | Freq: Once | INTRAVENOUS | Status: AC
  Administered 2017-08-04: 1000 mL via INTRAVENOUS

## 2017-08-04 NOTE — ED Triage Notes (Signed)
Patient arrives by [facility] with complaints of seizure. Patient's daughter called 911-daughter ~age 47 found Mother to be posturing. Patient is non-compliant with amlodipine because "she has not been able to find it"-EMS witnessed seizure for `30 seconds-BP 210/120 initially. BP now 150/100 HR 120 CBG 102.  Patient told EMS that she has a hx of seizures.

## 2017-08-04 NOTE — Discharge Instructions (Signed)
As discussed it is very important that she take all of your medication as directed, including her antihypertension medication, your thyroid medication, and her antiseizure medication.  Return here for concerning changes in her condition, otherwise be sure to follow-up with your primary care team.

## 2017-08-04 NOTE — ED Notes (Signed)
Bed: WA21 Expected date:  Expected time:  Means of arrival:  Comments: EMS 47 yo female from home/seizure-non-compliant with meds-post-ictal

## 2017-08-04 NOTE — ED Provider Notes (Signed)
[facility] DEPT Provider Note   CSN: [id number] Arrival date & time: 08/04/17  0603     History   Chief Complaint Chief Complaint  Patient presents with  . Seizures    HPI Jill Hopkins is a 47 y.o. female.  HPI  Patient presents with family members after a witnessed seizure. Patient acknowledges a history of seizures but takes no medication, states that she has not been evaluated by a physician for these. She does have history of Graves' disease, hypertension, states that she does take medication for each of these maladies. Patient was with her daughter, a 62 year old, today when she had 2 episodes of seizure activity, with shaking, frothing at the mouth. There is one reported minor fall, but patient has returned essentially baseline, denies focal pain, denies focal weakness, acknowledges diffuse pain, soreness, and fatigue.   Past Medical History:  Diagnosis Date  . Depression   . Hypertension   . Renal disorder   . Seizures ([place])   . Thyroid disease     Patient Active Problem List   Diagnosis Date Noted  . Breast mass 01/31/2017  . Nodule of soft tissue 01/31/2017  . 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 CVA (cerebral vascular accident) ([place]) 11/25/2016  . Falls 10/14/2016  . Unintentional weight loss 10/14/2016  . Chronic kidney disease 09/21/2016  . Encounter for HIV (human immunodeficiency virus) test 09/21/2016  . 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 Paresthesias   . Hypertension 07/02/2014  . Thyroid disease 07/02/2014  . Depression 07/02/2014  . Excessive or frequent menstruation 03/18/2013    Past Surgical History:  Procedure Laterality Date  . CERVICAL CERCLAGE       OB History   None      Home Medications    Prior to Admission medications   Medication Sig Start Date End Date Taking? Authorizing Provider  amLODipine (NORVASC) 10 MG tablet TAKE 1 TABLET BY MOUTH EVERY DAY 02/13/17   Kalman Shan Ratliff, DO  ARIPiprazole (ABILIFY) 2 MG tablet TAKE 1 TABLET BY MOUTH EVERY DAY 06/08/17   Kalman Shan Ratliff, DO  aspirin EC 81 MG tablet Take 1 tablet (81 mg total) by mouth daily. 11/25/16 11/25/17  Shela Leff, MD  buPROPion (WELLBUTRIN SR) 150 MG 12 hr tablet TAKE 1 TABLET BY MOUTH TWICE A DAY 07/17/17   Hoffman, Jessica Ratliff, DO  buPROPion (ZYBAN) 150 MG 12 hr tablet TAKE 1 TABLET BY MOUTH TWICE A DAY 11/16/16   Ledell Noss, MD  clindamycin (CLEOCIN-T) 1 % external solution Apply topically 2 (two) times daily. 05/25/17   Shelly Bombard, MD  etonogestrel-ethinyl estradiol (NUVARING) 0.12-0.015 MG/24HR vaginal ring Insert vaginally and leave in place for 3 consecutive weeks, then remove for 1 week. 10/12/16   Shelly Bombard, MD  ferrous sulfate 325 (65 FE) MG tablet Take 1 tablet (325 mg total) by mouth daily with breakfast. 10/12/16   Shelly Bombard, MD  gabapentin (NEURONTIN) 300 MG capsule TAKE  2 CAPS TWICE A DAY 07/17/17   Cameron Sprang, MD  levothyroxine (SYNTHROID, LEVOTHROID) 150 MCG tablet TAKE 1 TABLET BY MOUTH EVERY DAY 06/08/17   Kalman Shan Ratliff, DO  Prenat-FeCbn-FeAsp-Meth-FA-DHA (PRENATE MINI) 18-0.6-0.4-350 MG CAPS Take 1 capsule by mouth daily before breakfast. 05/25/17   Shelly Bombard, MD  rosuvastatin (CRESTOR) 20 MG tablet Take 1 tablet (20 mg total) by mouth daily. 12/19/16   Valinda Party, DO    Family History Family History  Problem Relation Age of Onset  . Hypertension Mother     Social History Social History   Tobacco Use  . Smoking status: Former Smoker    Types: Cigarettes    Last attempt to quit: 08/17/2014    Years since quitting: 2.9  . Smokeless tobacco: Never Used  Substance Use Topics  . Alcohol use: No    Alcohol/week: 0.0 oz  . Drug use: Yes    Types: Marijuana, Cocaine    Comment: Reports former substance use Hx, no current use     Allergies   Ibuprofen   Review of Systems Review of Systems    Constitutional:       Per HPI, otherwise negative  HENT:       Per HPI, otherwise negative  Respiratory:       Per HPI, otherwise negative  Cardiovascular:       Per HPI, otherwise negative  Gastrointestinal: Negative for vomiting.  Endocrine:       Negative aside from HPI  Genitourinary:       Neg aside from HPI   Musculoskeletal:       Per HPI, otherwise negative  Skin: Negative.   Neurological: Positive for seizures. Negative for syncope.     Physical Exam Updated Vital Signs BP (!) 167/115   Pulse 86   Temp 98.5 F (36.9 C) (Oral)   Resp (!) 22   Ht 5\' 4"  (1.626 m)   Wt 70.3 kg (155 lb)   LMP 08/01/2017 (Approximate)   SpO2 100%   BMI 26.61 kg/m   Physical Exam  Constitutional: She is oriented to person, place, and time. She appears well-developed and well-nourished. No distress.  HENT:  Head: Normocephalic and atraumatic.  Eyes: Conjunctivae and EOM are normal.  Neck: Muscular tenderness present. No spinous process tenderness present. No neck rigidity. No edema, no erythema and normal range of motion present.  Cardiovascular: Normal rate and regular rhythm.  Pulmonary/Chest: Effort normal and breath sounds normal. No stridor. No respiratory distress.  Abdominal: She exhibits no distension.  Musculoskeletal: She exhibits no edema.  Neurological: She is alert and oriented to person, place, and time. She displays no atrophy and no tremor. No cranial nerve deficit. She exhibits normal muscle tone.  Skin: Skin is warm and dry.  Psychiatric: She has a normal mood and affect.  Nursing note and vitals reviewed.    ED Treatments / Results  Labs (all labs ordered are listed, but only abnormal results are displayed) Labs Reviewed  TSH  T4, FREE  CBG MONITORING, ED  I-STAT BETA HCG BLOOD, ED ([facility], [facility], [facility] ONLY)     Procedures Procedures (including critical care time)  Medications Ordered in ED Medications  sodium chloride 0.9 % bolus 1,000 mL (1,000 mLs  Intravenous New Bag/Given 08/04/17 0759)     Initial Impression / Assessment and Plan / ED Course  I have reviewed the triage vital signs and the nursing notes.  Pertinent labs & imaging results that were available during my care of the patient were reviewed by me and considered in my medical decision making (see chart for details).  This generally well-appearing female presents after witnessed seizures. Patient's acknowledgment of seizure disorder without any ongoing medication use, is reassuring, labs also reassuring, no evidence for elect light abnormalities, glucose changes.  Free T4 consistent with medication noncompliance with thyroid supplement as well and on repeat exam the patient knowledge is inconsistent taking of her thyroid and her blood pressure medication.  When  she is now coming by her companion, seems to corroborate this history.  When she has had no additional seizure activity. A lengthy conversation about the need to take all medication as directed, including thyroid medication, antihypertensive and Keppra. Patient voices understanding of this, as well as importance of following up with primary care next week.   Patient was monitored here, started on Keppra, discharged with outpatient follow-up, neurology follow-up.  Final Clinical Impressions(s) / ED Diagnoses  Seizure   Carmin Muskrat, MD 08/04/17 1218

## 2017-08-04 NOTE — ED Notes (Signed)
PT ambulate to and from restroom with no assist. Urine sample save in main lab

## 2017-08-19 IMAGING — MR MR HEAD W/O CM
8 of 10 series · 36 of 48 positions shown · non-contrast
Comparison: MRI of the head July 02, 2014

CLINICAL DATA: Followup Chiari malformation. Gait instability and
falls.

EXAM:
MRI HEAD WITHOUT CONTRAST
TECHNIQUE: Multiplanar, multiecho pulse sequences of the brain and surrounding
structures were obtained without intravenous contrast.

[Series 3: DWI · axial · 3.0mm · 1.09mm/px · z∈[-43,+96]mm · 8 of 96 slices shown (1 of 4)]
[im 1/96]
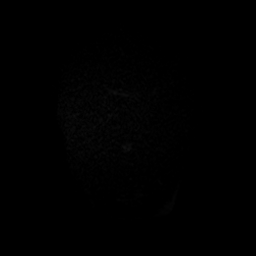
[im 11/96]
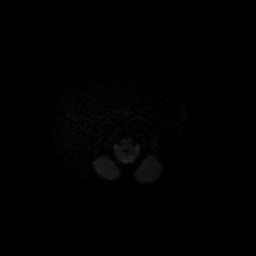
[im 32/96]
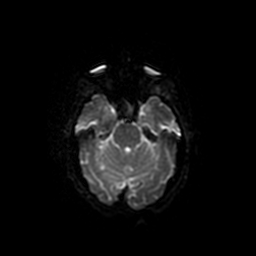
[im 43/96]
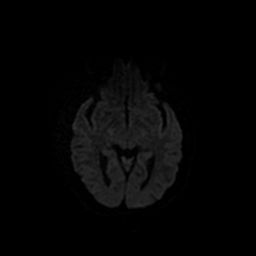
[im 53/96]
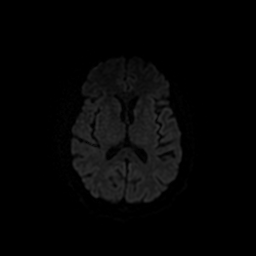
[im 64/96]
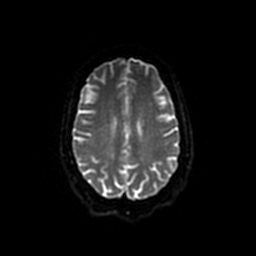
[im 85/96]
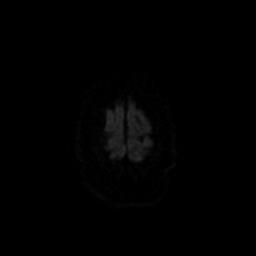
[im 96/96]
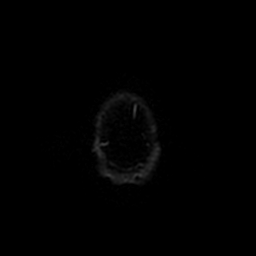

[Series 4: T1 · sagittal · 5.0mm · 0.47mm/px · 2 of 24 slices shown]
[im 1/24]
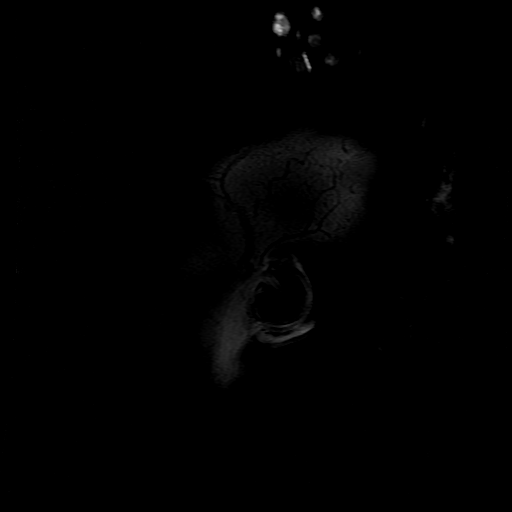
[im 12/24]
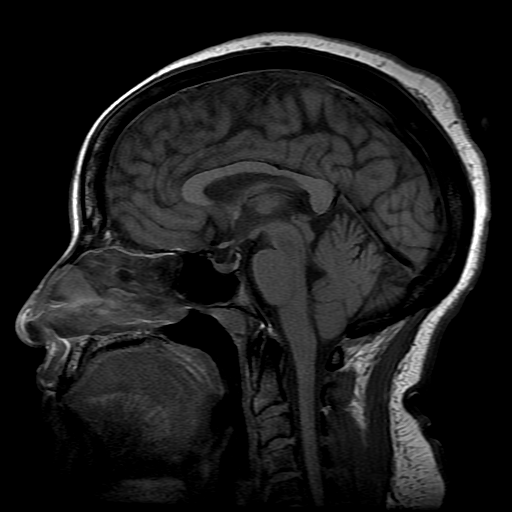

[Series 5: DWI · coronal · 5.0mm · 1.09mm/px · 8 of 66 slices shown (2 of 4)]
[im 1/66]
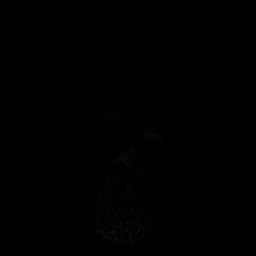
[im 10/66]
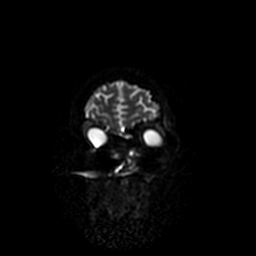
[im 19/66]
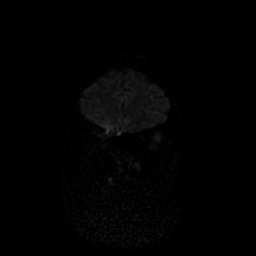
[im 28/66]
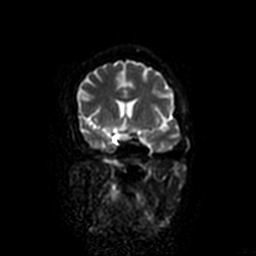
[im 38/66]
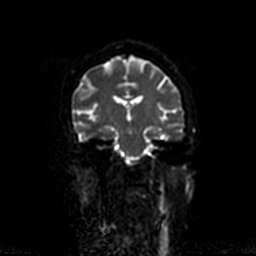
[im 47/66]
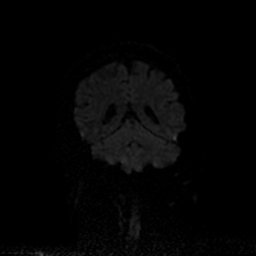
[im 56/66]
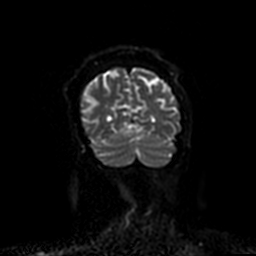
[im 66/66]
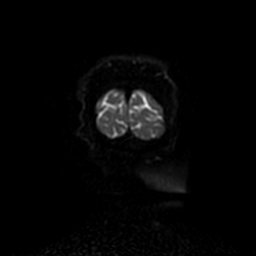

[Series 6: T2 · axial · 5.0mm · 0.43mm/px · z∈[-50,+102]mm · 3 of 23 slices shown (1 of 2)]
[im 1/23]
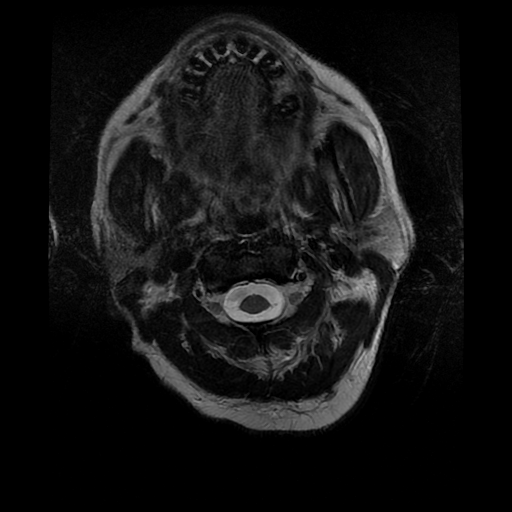
[im 12/23]
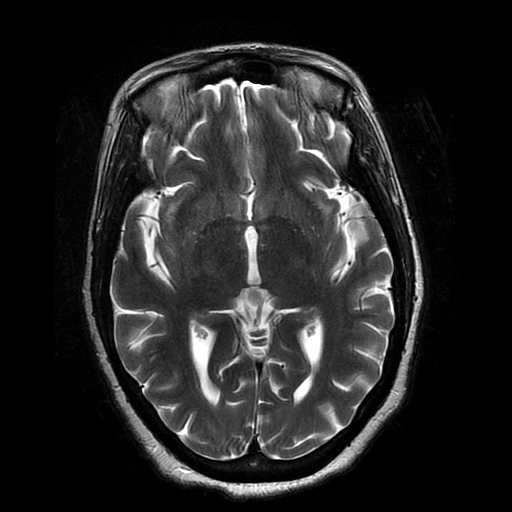
[im 23/23]
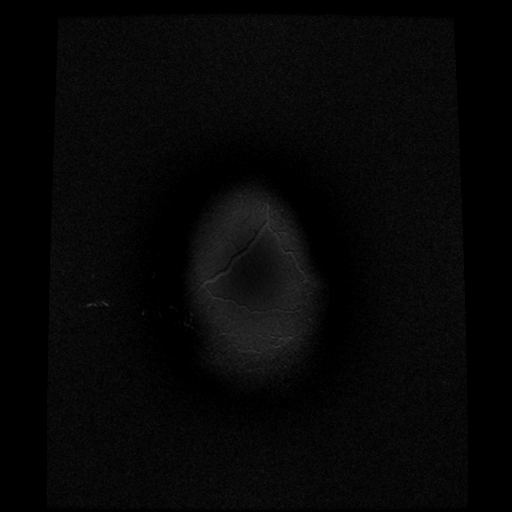

[Series 7: FLAIR · axial · 3.0mm · 0.43mm/px · z∈[-51,+103]mm · 3 of 27 slices shown]
[im 1/27]
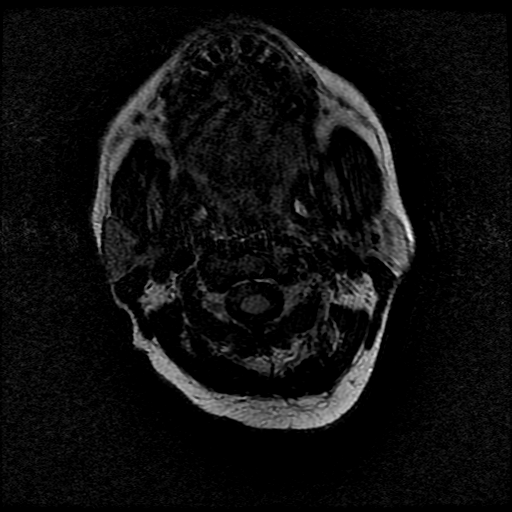
[im 14/27]
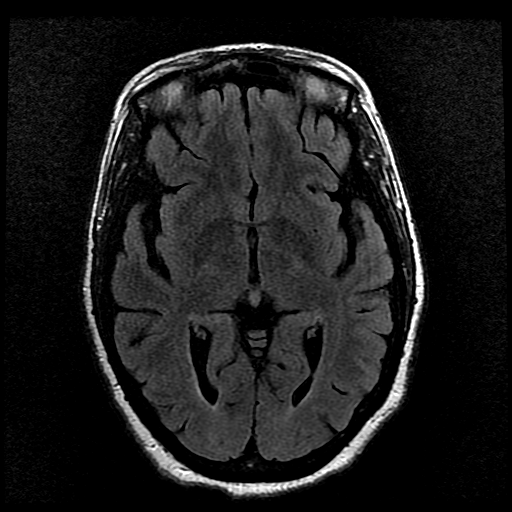
[im 27/27]
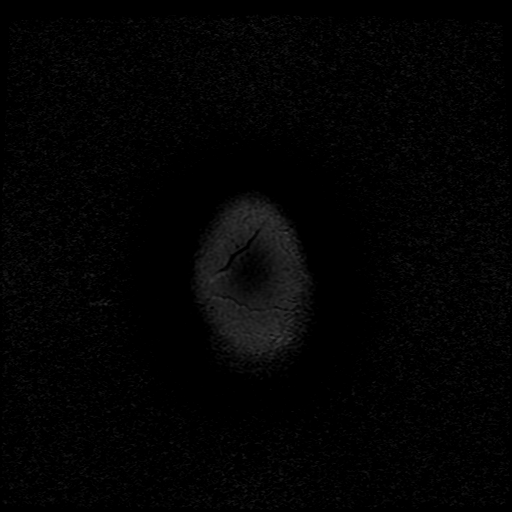

[Series 10: T2 · coronal · 5.0mm · 0.45mm/px · 3 of 28 slices shown (2 of 2)]
[im 1/28]
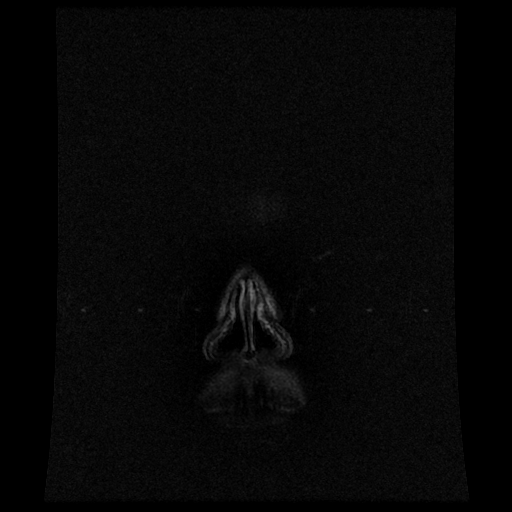
[im 14/28]
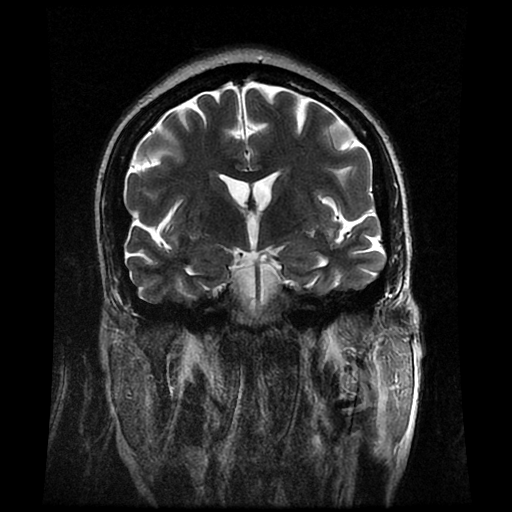
[im 28/28]
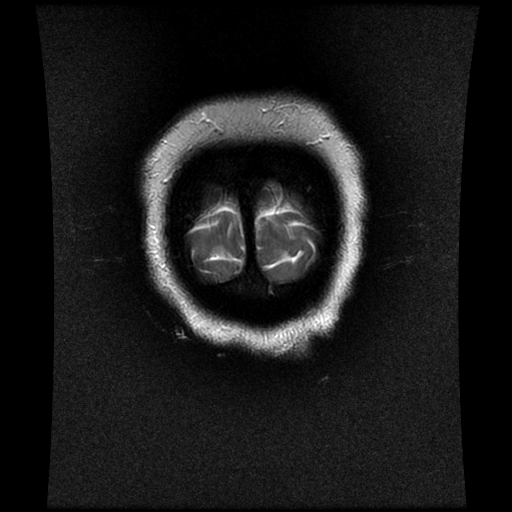

[Series 300: DWI · axial · 3.0mm · 1.09mm/px · z∈[-43,+96]mm · 5 of 48 slices shown (3 of 4)]
[im 1/48]
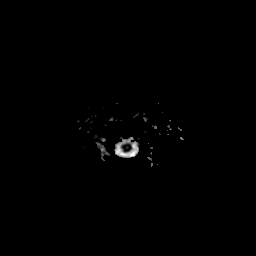
[im 12/48]
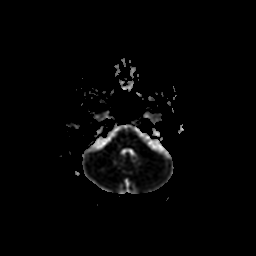
[im 24/48]
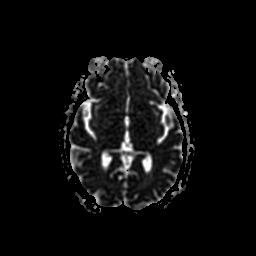
[im 36/48]
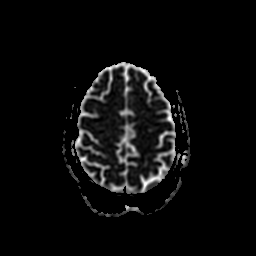
[im 48/48]
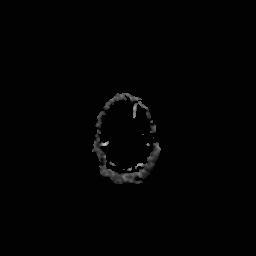

[Series 500: DWI · coronal · 5.0mm · 1.09mm/px · 4 of 33 slices shown (4 of 4)]
[im 1/33]
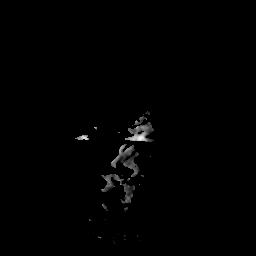
[im 11/33]
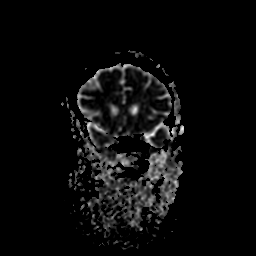
[im 22/33]
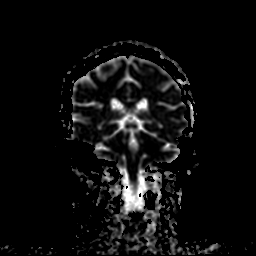
[im 33/33]
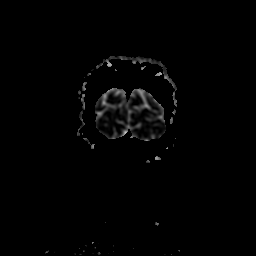

[36 of 48 positions shown; findings below may reference images not displayed]

FINDINGS: BRAIN: No reduced diffusion to suggest acute ischemia. No
susceptibility artifact to suggest hemorrhage. The ventricles and
sulci are normal for patient's age. Patchy supratentorial white
matter FLAIR T2 hyperintensities, increased from prior imaging. New
nonacute small RIGHT cerebellar infarcts. No suspicious parenchymal
signal, mass or mass effect. No abnormal extra-axial fluid
collections. Cerebellar tonsils descend 8 mm below the foramen
magnum, mildly pointed appearance on the RIGHT. Maintained cerebral
spinal fluid space at the foramen magnum.

VASCULAR: Normal major intracranial vascular flow voids present at
skull base.

SKULL AND UPPER CERVICAL SPINE: No abnormal sellar expansion. No
suspicious calvarial bone marrow signal. Craniocervical junction
maintained.

SINUSES/ORBITS: Mild paranasal sinus mucosal thickening. RIGHT
maxillary mucosal retention cyst. The included ocular globes and
orbital contents are non-suspicious.

OTHER: None.
IMPRESSION: 1. Stable mild Chiari 1 malformation.
2. Increased mild white matter changes compatible chronic small
vessel ischemic disease. New nonacute RIGHT cerebellar infarcts.

## 2017-09-18 ENCOUNTER — Other Ambulatory Visit: Payer: Self-pay | Admitting: Obstetrics

## 2017-09-18 ENCOUNTER — Other Ambulatory Visit: Payer: Self-pay | Admitting: Internal Medicine

## 2017-09-18 DIAGNOSIS — I10 Essential (primary) hypertension: Secondary | ICD-10-CM

## 2017-09-18 DIAGNOSIS — Z1231 Encounter for screening mammogram for malignant neoplasm of breast: Secondary | ICD-10-CM

## 2017-09-25 NOTE — Progress Notes (Signed)
   CC: Follow up on essential hypertension  HPI:  [name] is a 48 y.o. female with history noted below that presents to the Internal Medicine Clinic for follow up on essential hypertension.  Please see problem based charting for the status of patient's chronic medical conditions.    Past Medical History:  Diagnosis Date  . Depression   . Hypertension   . Renal disorder   . Seizures ([place])   . Thyroid disease     Review of Systems:  Review of Systems  Constitutional: Negative for chills and fever.  Respiratory: Positive for cough and sputum production. Negative for shortness of breath.   Cardiovascular: Negative for chest pain.  Gastrointestinal: Negative for nausea and vomiting.     Physical Exam:  Vitals:   09/28/17 1436  BP: (!) 182/87  Pulse: (!) 54  Temp: 97.6 F (36.4 C)  TempSrc: Oral  SpO2: 100%  Weight: 159 lb 1.6 oz (72.2 kg)  Height: 5\' 4"  (1.626 m)   Physical Exam  Constitutional: She is well-developed, well-nourished, and in no distress.  Cardiovascular: Normal rate, regular rhythm and normal heart sounds. Exam reveals no gallop and no friction rub.  No murmur heard. Pulmonary/Chest: Effort normal and breath sounds normal. No respiratory distress. She has no wheezes. She has no rales.     Assessment & Plan:   See encounters tab for problem based medical decision making.    Patient discussed with Dr. Rebeca Alert

## 2017-09-28 ENCOUNTER — Ambulatory Visit (INDEPENDENT_AMBULATORY_CARE_PROVIDER_SITE_OTHER): Payer: Medicaid Other | Admitting: Internal Medicine

## 2017-09-28 ENCOUNTER — Other Ambulatory Visit: Payer: Self-pay

## 2017-09-28 ENCOUNTER — Encounter: Payer: Self-pay | Admitting: Internal Medicine

## 2017-09-28 VITALS — BP 182/87 | HR 54 | Temp 97.6°F | Ht 64.0 in | Wt 159.1 lb

## 2017-09-28 DIAGNOSIS — F332 Major depressive disorder, recurrent severe without psychotic features: Secondary | ICD-10-CM

## 2017-09-28 DIAGNOSIS — E079 Disorder of thyroid, unspecified: Secondary | ICD-10-CM

## 2017-09-28 DIAGNOSIS — M5020 Other cervical disc displacement, unspecified cervical region: Secondary | ICD-10-CM

## 2017-09-28 DIAGNOSIS — I10 Essential (primary) hypertension: Secondary | ICD-10-CM | POA: Diagnosis present

## 2017-09-28 DIAGNOSIS — J069 Acute upper respiratory infection, unspecified: Secondary | ICD-10-CM

## 2017-09-28 DIAGNOSIS — R569 Unspecified convulsions: Secondary | ICD-10-CM

## 2017-09-28 MED ORDER — ALBUTEROL SULFATE HFA 108 (90 BASE) MCG/ACT IN AERS
1.0000 | INHALATION_SPRAY | Freq: Four times a day (QID) | RESPIRATORY_TRACT | 0 refills | Status: DC | PRN
Start: 2017-09-28 — End: 2018-01-17

## 2017-09-28 MED ORDER — LEVETIRACETAM 500 MG PO TABS
500.0000 mg | ORAL_TABLET | Freq: Two times a day (BID) | ORAL | 0 refills | Status: DC
Start: 2017-09-28 — End: 2017-11-09

## 2017-09-28 MED ORDER — LEVOTHYROXINE SODIUM 150 MCG PO TABS: 150.0000 ug | ORAL_TABLET | Freq: Every day | ORAL | 5 refills | Status: DC

## 2017-09-28 MED ORDER — GABAPENTIN 300 MG PO CAPS: ORAL_CAPSULE | ORAL | 0 refills | Status: DC

## 2017-09-28 MED ORDER — AMLODIPINE BESYLATE 10 MG PO TABS: 10.0000 mg | ORAL_TABLET | Freq: Every day | ORAL | 4 refills | Status: AC

## 2017-09-28 MED ORDER — ARIPIPRAZOLE 2 MG PO TABS: 2.0000 mg | ORAL_TABLET | Freq: Every day | ORAL | 2 refills | Status: AC

## 2017-09-28 MED ORDER — BENZONATATE 100 MG PO CAPS: 100.0000 mg | ORAL_CAPSULE | Freq: Three times a day (TID) | ORAL | 0 refills | Status: DC | PRN

## 2017-09-28 MED ORDER — FLUTICASONE PROPIONATE 50 MCG/ACT NA SUSP: 1.0000 | Freq: Every day | NASAL | 0 refills | Status: DC

## 2017-09-28 NOTE — Patient Instructions (Signed)
Jill Hopkins,  It was a pleasure seeing you today. Please follow-up with your neurologist. Please follow up with neurosurgery. A referral has been made to psychiatry.Your medication and refills have been sent to your pharmacy.  You need to follow-up in one month.

## 2017-10-01 DIAGNOSIS — J069 Acute upper respiratory infection, unspecified: Secondary | ICD-10-CM | POA: Insufficient documentation

## 2017-10-01 DIAGNOSIS — R569 Unspecified convulsions: Secondary | ICD-10-CM | POA: Insufficient documentation

## 2017-10-01 NOTE — Assessment & Plan Note (Signed)
Assessment:  Depression Patient is currently prescribed bupropion 150mg  BID and Aripiprazole 2mg  daily.  Currently patient does not take and states she has run out of her medication.  Patient was referred to psychiatry and has attended 1 visit and saw a case manager and not a physician at that time.  She states she would like to go back.  Will refer.  I re-iterated the importance of following up with Psychiatry in order to make sure her medical regimen is optimized.      Plan -patient has refills on bupropion, will not re-order at this time - refill abilify - will place another referral to psychiatry

## 2017-10-01 NOTE — Assessment & Plan Note (Signed)
Assessment:  Hypothyroidism TSH 07/2017 in the ED was 45.759.  Patient states she takes synthroid 148mcg a few times a week.  States she needs refills.  Plan - refill synthroid 144mcg daily -TSH in 6weeks

## 2017-10-01 NOTE — Assessment & Plan Note (Signed)
Assessment:  URI 2 week history of cough and congestion.  No alarming exam findings.  Will treat for URI with Tessalon pearls, Albuterol inhaler and Nasal spray   Plan -conservative treatment as noted above.

## 2017-10-01 NOTE — Assessment & Plan Note (Signed)
Assessment:  Seizures Patient was evaluated in the ED for seizures.  She was told to follow up with neurology.  She has seen them previously for bilateral hand tingling.  Will re-refer for seizure assessment.  She has seen [facility] and will refer her there.  Will refill Keppra by the time she sees neurology.  Plan -refill keppra -neurology referral

## 2017-10-01 NOTE — Assessment & Plan Note (Signed)
Assessment: Essential hypertension Patient is currently prescribed amlodipine 10mg  daily, States she hasn't taken her medication in one month.  Today's blood pressure is  182/87.  Patient has refills available.  Recommended she re-start her amlodipine 10mg  daily. Plan -continue amlodipine 10mg    - follow up in one month in the [facility] for blood pressure re-check

## 2017-10-01 NOTE — Assessment & Plan Note (Signed)
Assessment:  Bilateral arm tingling Seen on 01/19/2017 by me for the same issue.  In brief, patient had an MR of cervical spine on 12/23/16 that showed slightly worsening of disease at C5-6 level. Degenerative spondylosis with endplate osteophytes and a prominent broad-based disc herniation. This effaces the subarachnoid space and indents the cord. This may be explaining patient's symptoms.  I referred to neurosurgery however patient did not follow up.  States she wants to follow up now.  I re-iterated the importance of following up on referrals made for the symptoms she presents for in the [facility].   Plan -neurosurgery referral  - refill gabapentin

## 2017-10-03 ENCOUNTER — Encounter: Payer: Self-pay | Admitting: *Deleted

## 2017-10-06 NOTE — Progress Notes (Signed)
Internal Medicine Clinic Attending  Case discussed with Dr. Hoffman  at the time of the visit.  We reviewed the resident's history and exam and pertinent patient test results.  I agree with the assessment, diagnosis, and plan of care documented in the resident's note.  Alexander N Raines, MD   

## 2017-10-18 ENCOUNTER — Encounter: Payer: Self-pay | Admitting: Internal Medicine

## 2017-10-18 ENCOUNTER — Ambulatory Visit (INDEPENDENT_AMBULATORY_CARE_PROVIDER_SITE_OTHER): Payer: Medicaid Other | Admitting: Internal Medicine

## 2017-10-18 ENCOUNTER — Other Ambulatory Visit: Payer: Self-pay

## 2017-10-18 VITALS — BP 113/74 | HR 69 | Temp 98.1°F | Ht 64.0 in | Wt 159.0 lb

## 2017-10-18 DIAGNOSIS — Z79899 Other long term (current) drug therapy: Secondary | ICD-10-CM | POA: Diagnosis not present

## 2017-10-18 DIAGNOSIS — E079 Disorder of thyroid, unspecified: Secondary | ICD-10-CM | POA: Diagnosis not present

## 2017-10-18 DIAGNOSIS — Z79890 Hormone replacement therapy (postmenopausal): Secondary | ICD-10-CM | POA: Diagnosis not present

## 2017-10-18 DIAGNOSIS — Z87891 Personal history of nicotine dependence: Secondary | ICD-10-CM | POA: Diagnosis not present

## 2017-10-18 DIAGNOSIS — L508 Other urticaria: Secondary | ICD-10-CM | POA: Diagnosis not present

## 2017-10-18 MED ORDER — RANITIDINE HCL 150 MG PO CAPS: 150.0000 mg | ORAL_CAPSULE | Freq: Two times a day (BID) | ORAL | 0 refills | Status: DC

## 2017-10-18 MED ORDER — CETIRIZINE HCL 10 MG PO TABS: 10.0000 mg | ORAL_TABLET | Freq: Every day | ORAL | 0 refills | Status: DC

## 2017-10-18 MED ORDER — MONTELUKAST SODIUM 10 MG PO TABS: 10.0000 mg | ORAL_TABLET | Freq: Every day | ORAL | 0 refills | Status: DC

## 2017-10-18 MED ORDER — TRIAMCINOLONE ACETONIDE 0.05 % EX OINT: 1.0000 "application " | TOPICAL_OINTMENT | Freq: Two times a day (BID) | CUTANEOUS | 0 refills | Status: DC | PRN

## 2017-10-18 NOTE — Progress Notes (Signed)
CC: chronic urticaria  HPI:  [name] is a 47 y.o. femalw with PMH below.  She is here to address her chronic urticaria, we also addressed her hypothyroidism.  Please see A&P for status of the patient's chronic medical conditions  Past Medical History:  Diagnosis Date  . Depression   . Hypertension   . Renal disorder   . Seizures ([place])   . Thyroid disease    Review of Systems:  ROS: Pulmonary: pt denies increased work of breathing, shortness of breath,  Cardiac: pt denies palpitations, chest pain,  Abdominal: pt denies abdominal pain, nausea, vomiting, or diarrhea  Physical Exam:  Vitals:   10/18/17 1504  BP: 113/74  Pulse: 69  Temp: 98.1 F (36.7 C)  TempSrc: Oral  SpO2: 100%  Weight: 159 lb (72.1 kg)  Height: 5\' 4"  (1.626 m)   Physical Exam  Constitutional: No distress.  Cardiovascular: Normal rate, regular rhythm and normal heart sounds. Exam reveals no gallop and no friction rub.  No murmur heard. Pulmonary/Chest: Effort normal and breath sounds normal. No respiratory distress. She has no wheezes. She has no rales. She exhibits no tenderness.  Neurological: She is alert.  Skin: She is not diaphoretic.  On examination of the skin today the patient has normal appearing skin it is warm and dry, there are no urticaria, no rash is present.    Social History   Socioeconomic History  . Marital status: Single    Spouse name: Not on file  . Number of children: 1  . Years of education: 91  . Highest education level: Not on file  Occupational History  . Not on file  Social Needs  . Financial resource strain: Not on file  . Food insecurity:    Worry: Not on file    Inability: Not on file  . Transportation needs:    Medical: Not on file    Non-medical: Not on file  Tobacco Use  . Smoking status: Former Smoker    Types: Cigarettes    Last attempt to quit: 08/17/2014    Years since quitting: 3.1  . Smokeless tobacco: Never Used  Substance and Sexual  Activity  . Alcohol use: No    Alcohol/week: 0.0 oz  . Drug use: Yes    Types: Marijuana, Cocaine    Comment: Reports former substance use Hx, no current use  . Sexual activity: Yes    Partners: Male    Birth control/protection: Inserts  Lifestyle  . Physical activity:    Days per week: Not on file    Minutes per session: Not on file  . Stress: Not on file  Relationships  . Social connections:    Talks on phone: Not on file    Gets together: Not on file    Attends religious service: Not on file    Active member of club or organization: Not on file    Attends meetings of clubs or organizations: Not on file    Relationship status: Not on file  . Intimate partner violence:    Fear of current or ex partner: Not on file    Emotionally abused: Not on file    Physically abused: Not on file    Forced sexual activity: Not on file  Other Topics Concern  . Not on file  Social History Narrative   Lives in 1 story home with mother and 1 child   Highest level of education: HS graduate   Unemployed x3 years   Caffeine  use: 1 cup tea per day    Family History  Problem Relation Age of Onset  . Hypertension Mother     Assessment & Plan:   See Encounters Tab for problem based charting.  Patient discussed with Dr. Angelia Mould

## 2017-10-18 NOTE — Patient Instructions (Addendum)
We will continue with antihistamines and leukotriene antagonists for your chronic hives.  I will also prescribe a steroid cream.  I need to check your TSH level today to see how your thyroid is functioning.    Hives Hives (urticaria) are itchy, red, swollen areas on your skin. Hives can show up on any part of your body, and they can vary in size. They can be as small as the tip of a pen or much larger. Hives often fade within 24 hours (acute hives). In other cases, new hives show up after old ones fade. This can continue for many days or weeks (chronic hives). Hives are caused by your body's reaction to an irritant or to something that you are allergic to (trigger). You can get hives right after being around a trigger or hours later. Hives do not spread from person to person (are not contagious). Hives may get worse if you scratch them, if you exercise, or if you have worries (emotional stress). Follow these instructions at home: Medicines  Take or apply over-the-counter and prescription medicines only as told by your doctor.  If you were prescribed an antibiotic medicine, use it as told by your doctor. Do not stop taking the antibiotic even if you start to feel better. Skin Care  Apply cool, wet cloths (cool compresses) to the itchy, red, swollen areas.  Do not scratch your skin. Do not rub your skin. General instructions  Do not take hot showers or baths. This can make itching worse.  Do not wear tight clothes.  Use sunscreen and wear clothing that covers your skin when you are outside.  Avoid any triggers that cause your hives. Keep a journal to help you keep track of what causes your hives. Write down: ? What medicines you take. ? What you eat and drink. ? What products you use on your skin.  Keep all follow-up visits as told by your doctor. This is important. Contact a doctor if:  Your symptoms are not better with medicine.  Your joints are painful or swollen. Get help right  away if:  You have a fever.  You have belly pain.  Your tongue or lips are swollen.  Your eyelids are swollen.  Your chest or throat feels tight.  You have trouble breathing or swallowing. These symptoms may be an emergency. Do not wait to see if the symptoms will go away. Get medical help right away. Call your local emergency services (911 in the [place] Do not drive yourself to the hospital. This information is not intended to replace advice given to you by your health care provider. Make sure you discuss any questions you have with your health care provider. Document Released: 01/12/2008 Document Revised: 09/10/2015 Document Reviewed: 01/21/2015 Elsevier Interactive Patient Education  2018 [employer].

## 2017-10-19 DIAGNOSIS — L508 Other urticaria: Secondary | ICD-10-CM | POA: Insufficient documentation

## 2017-10-19 LAB — TSH: TSH: 5.04 u[IU]/mL — AB (ref 0.450–4.500)

## 2017-10-19 NOTE — Assessment & Plan Note (Signed)
The patient has been dealing with frequent urticaria exacerbations over the last few months.  She admits to being under a lot of stress lately but has no other triggers such as dietary changes, alcohol use, environmental changes.  So far she has been taking PRN benadryl 25mg  and has a topical low strength hydrocortisone cream which is also OTC.  They have helped but her symptoms are still persistent and making life very difficult.    -pt educated about her condition -added ceterizine for use in the day, added ranitidine and singulair -ordered a little stronger triamcinolone 0.5% cream for refractory urticaria -we will try these interventions as they are a small stepwise increase in conservative management.  Discussed that if these interventions fail it would be best for the patient to see a dermatogist and the patient was agreeable to this plan

## 2017-10-19 NOTE — Assessment & Plan Note (Signed)
Lab Results  Component Value Date   TSH 5.040 (H) 10/18/2017   Pts TSH is much improved today.  She was seen two weeks ago and admitted to taking her synthroid only a few times per week.  She states that since her last visit she has been taking it daily as instructed and has not missed a dose.  Her weight is stable today and she is not symptomatic, her heart rate and blood pressure are excellent today.  - I will not increase her synthroid dose as she has only been taking it appropriately for two weeks now.  Her dose is over about 30mcg over her weight based dosing and we could go too far in the other direction.   -I will call her with the results, continue to encourage good compliance and we will recheck in about 6 weeks.

## 2017-10-20 ENCOUNTER — Encounter: Payer: Self-pay | Admitting: Internal Medicine

## 2017-10-20 NOTE — Progress Notes (Signed)
Internal Medicine Clinic Attending  Case discussed with Dr. Winfrey  at the time of the visit.  We reviewed the resident's history and exam and pertinent patient test results.  I agree with the assessment, diagnosis, and plan of care documented in the resident's note.  

## 2017-10-24 ENCOUNTER — Ambulatory Visit: Payer: Medicaid Other

## 2017-10-30 ENCOUNTER — Other Ambulatory Visit: Payer: Self-pay | Admitting: Internal Medicine

## 2017-10-30 DIAGNOSIS — F332 Major depressive disorder, recurrent severe without psychotic features: Secondary | ICD-10-CM

## 2017-10-30 DIAGNOSIS — F331 Major depressive disorder, recurrent, moderate: Secondary | ICD-10-CM | POA: Diagnosis not present

## 2017-11-08 DIAGNOSIS — M5020 Other cervical disc displacement, unspecified cervical region: Secondary | ICD-10-CM | POA: Diagnosis not present

## 2017-11-09 ENCOUNTER — Other Ambulatory Visit: Payer: Self-pay | Admitting: Internal Medicine

## 2017-11-16 ENCOUNTER — Emergency Department (HOSPITAL_COMMUNITY)
Admission: EM | Admit: 2017-11-16 | Discharge: 2017-11-16 | Disposition: A | Discharge status: Home / Self Care | Payer: Medicaid Other | Attending: Emergency Medicine | Admitting: Emergency Medicine

## 2017-11-16 ENCOUNTER — Encounter (HOSPITAL_COMMUNITY): Payer: Self-pay | Admitting: Emergency Medicine

## 2017-11-16 DIAGNOSIS — N189 Chronic kidney disease, unspecified: Secondary | ICD-10-CM | POA: Insufficient documentation

## 2017-11-16 DIAGNOSIS — R440 Auditory hallucinations: Secondary | ICD-10-CM | POA: Insufficient documentation

## 2017-11-16 DIAGNOSIS — R410 Disorientation, unspecified: Secondary | ICD-10-CM | POA: Diagnosis not present

## 2017-11-16 DIAGNOSIS — R51 Headache: Secondary | ICD-10-CM | POA: Insufficient documentation

## 2017-11-16 DIAGNOSIS — R296 Repeated falls: Secondary | ICD-10-CM | POA: Insufficient documentation

## 2017-11-16 DIAGNOSIS — E079 Disorder of thyroid, unspecified: Secondary | ICD-10-CM | POA: Diagnosis not present

## 2017-11-16 DIAGNOSIS — I129 Hypertensive chronic kidney disease with stage 1 through stage 4 chronic kidney disease, or unspecified chronic kidney disease: Secondary | ICD-10-CM | POA: Insufficient documentation

## 2017-11-16 DIAGNOSIS — Z87891 Personal history of nicotine dependence: Secondary | ICD-10-CM | POA: Insufficient documentation

## 2017-11-16 DIAGNOSIS — Z79899 Other long term (current) drug therapy: Secondary | ICD-10-CM | POA: Insufficient documentation

## 2017-11-16 DIAGNOSIS — R441 Visual hallucinations: Secondary | ICD-10-CM | POA: Insufficient documentation

## 2017-11-16 DIAGNOSIS — I10 Essential (primary) hypertension: Secondary | ICD-10-CM | POA: Diagnosis not present

## 2017-11-16 DIAGNOSIS — Z7982 Long term (current) use of aspirin: Secondary | ICD-10-CM | POA: Insufficient documentation

## 2017-11-16 DIAGNOSIS — G40909 Epilepsy, unspecified, not intractable, without status epilepticus: Secondary | ICD-10-CM | POA: Diagnosis not present

## 2017-11-16 DIAGNOSIS — Z8673 Personal history of transient ischemic attack (TIA), and cerebral infarction without residual deficits: Secondary | ICD-10-CM | POA: Insufficient documentation

## 2017-11-16 DIAGNOSIS — R404 Transient alteration of awareness: Secondary | ICD-10-CM | POA: Diagnosis not present

## 2017-11-16 DIAGNOSIS — Z9114 Patient's other noncompliance with medication regimen: Secondary | ICD-10-CM | POA: Insufficient documentation

## 2017-11-16 DIAGNOSIS — R569 Unspecified convulsions: Secondary | ICD-10-CM

## 2017-11-16 LAB — BASIC METABOLIC PANEL
Anion gap: 11 (ref 5–15)
BUN: 7 mg/dL (ref 6–20)
CALCIUM: 8.9 mg/dL (ref 8.9–10.3)
CO2: 23 mmol/L (ref 22–32)
Chloride: 106 mmol/L (ref 98–111)
Creatinine, Ser: 1.22 mg/dL — ABNORMAL HIGH (ref 0.44–1.00)
GFR calc Af Amer: >60 mL/min (ref >60)
GFR, EST NON AFRICAN AMERICAN: 53 mL/min — AB (ref >60)
GLUCOSE: 82 mg/dL (ref 70–99)
Potassium: 3.1 mmol/L — ABNORMAL LOW (ref 3.5–5.1)
SODIUM: 140 mmol/L (ref 135–145)

## 2017-11-16 LAB — I-STAT BETA HCG BLOOD, ED (MC, WL, AP ONLY)

## 2017-11-16 LAB — T4, FREE: Free T4: 0.51 ng/dL — ABNORMAL LOW (ref 0.82–1.77)

## 2017-11-16 LAB — TSH: TSH: 20.728 u[IU]/mL — AB (ref 0.350–4.500)

## 2017-11-16 MED ORDER — SODIUM CHLORIDE 0.9 % IV BOLUS
1000.0000 mL | Freq: Once | INTRAVENOUS | Status: AC
  Administered 2017-11-16: 1000 mL via INTRAVENOUS

## 2017-11-16 MED ORDER — LEVETIRACETAM IN NACL 500 MG/100ML IV SOLN
500.0000 mg | Freq: Once | INTRAVENOUS | Status: AC
  Administered 2017-11-16: 500 mg via INTRAVENOUS
  Filled 2017-11-16: qty 100

## 2017-11-16 MED ORDER — LEVETIRACETAM 500 MG PO TABS: 500.0000 mg | ORAL_TABLET | Freq: Two times a day (BID) | ORAL | 0 refills | Status: DC

## 2017-11-16 MED ORDER — ONDANSETRON HCL 4 MG/2ML IJ SOLN
4.0000 mg | Freq: Once | INTRAMUSCULAR | Status: AC
  Administered 2017-11-16: 4 mg via INTRAVENOUS
  Filled 2017-11-16: qty 2

## 2017-11-16 MED ORDER — POTASSIUM CHLORIDE CRYS ER 20 MEQ PO TBCR: 20.0000 meq | EXTENDED_RELEASE_TABLET | Freq: Two times a day (BID) | ORAL | 0 refills | Status: AC

## 2017-11-16 NOTE — ED Triage Notes (Signed)
Pt arrives via EMS from home with reports of a witnessed seizure. Reports she has no history of seizures but has had multiple this past year. Ambulatory on scene, confusion at first but A&Ox4 now.

## 2017-11-16 NOTE — Discharge Instructions (Signed)
Thank you for allowing me to care for you today in the Emergency Department.   He should call and schedule follow-up appointment with neurology.  Do not drive until you are cleared by neurology.  Take 1 tablet of Keppra 2 times daily.  It important not to miss any doses.  Please follow-up with your primary care provider regarding your thyroid levels.  It is also important not to miss any doses of this medication.  Return to the emergency department if you develop new or worsening symptoms including worsening seizures, confusion, slurred speech, new numbness or weakness, or other new, concerning symptoms.

## 2017-11-16 NOTE — ED Provider Notes (Signed)
[facility] EMERGENCY DEPARTMENT Provider Note   CSN: [id number] Arrival date & time: 11/16/17  1252     History   Chief Complaint Chief Complaint  Patient presents with  . Seizures    HPI Jill Hopkins is a 47 y.o. female with a history of depression, seizures, Graves' disease, and HTN who presents to the emergency department by EMS with a chief complaint of seizure.  The patient's daughter reports that she witnessed the seizure that lasted for less than 5 minutes.  She reports that the patient had shaking and jerking of all 4 extremities.  No drooling or foaming at the mouth.  The patient did not bite her lip or her tongue.  She was started on Keppra at a previous visit to the ED after having a seizure, but is not sure where her bottle medication is.  She reports that she has not been taking this medication or several of her other home medications.  She does report compliance with her thyroid medications.  The patient does report recurrent falls, which is previously been evaluated by neurosurgery.  She also reports auditory and visual hallucinations over the last 6 months.  The patient's daughter states that she does hear the patient whispering and talking to herself from time to time.  The patient is concerned that she may be sleepwalking, but the daughter states that she has not noticed this.  The patient states "I feel depressed right now and I need to get it together.  I say that every time I am here, but this time I am going to get it together."  She denies SI or HI.   She endorses an intermittent bilateral headache over the last few days.  No numbness, weakness, or lightheadedness. She denies fever, chills, abdominal pain, nausea, vomiting, diarrhea, tinnitus.  She reports that she has been sleeping less over the last few days   The history is provided by the patient. No language interpreter was used.    Past Medical History:  Diagnosis Date  . Depression     . Hypertension   . Renal disorder   . Seizures ([place])   . Thyroid disease     Patient Active Problem List   Diagnosis Date Noted  . Chronic urticaria 10/19/2017  . Seizure ([place]) 10/01/2017  . URI (upper respiratory infection) 10/01/2017  . Breast mass 01/31/2017  . Nodule of soft tissue 01/31/2017  . 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 CVA (cerebral vascular accident) ([place]) 11/25/2016  . Falls 10/14/2016  . Unintentional weight loss 10/14/2016  . Chronic kidney disease 09/21/2016  . Encounter for HIV (human immunodeficiency virus) test 09/21/2016  . 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 Paresthesias   . Hypertension 07/02/2014  . Thyroid disease 07/02/2014  . Depression 07/02/2014  . Excessive or frequent menstruation 03/18/2013    Past Surgical History:  Procedure Laterality Date  . CERVICAL CERCLAGE       OB History   None      Home Medications    Prior to Admission medications   Medication Sig Start Date End Date Taking? Authorizing Provider  albuterol (PROVENTIL HFA;VENTOLIN HFA) 108 (90 Base) MCG/ACT inhaler Inhale 1-2 puffs into the lungs every 6 (six) hours as needed for wheezing or shortness of breath. 09/28/17  Yes Hoffman, Jessica Ratliff, DO  amLODipine (NORVASC) 10 MG tablet Take 1 tablet (10 mg total) by mouth daily. 09/28/17  Yes Hoffman, Jessica Ratliff, DO  ARIPiprazole (ABILIFY) 2 MG tablet  Take 1 tablet (2 mg total) by mouth daily. 09/28/17  Yes Hoffman, Jessica Ratliff, DO  aspirin EC 81 MG tablet Take 1 tablet (81 mg total) by mouth daily. 11/25/16 11/25/17 Yes Shela Leff, MD  benzonatate (TESSALON) 100 MG capsule Take 1 capsule (100 mg total) by mouth 3 (three) times daily as needed for cough. 09/28/17  Yes Hoffman, Jessica Ratliff, DO  buPROPion (WELLBUTRIN SR) 150 MG 12 hr tablet TAKE 1 TABLET BY MOUTH TWICE A DAY Patient taking differently: TAKE 1 TABLET BY MOUTH BID 10/30/17  Yes Sid Falcon, MD  etonogestrel-ethinyl estradiol  (NUVARING) 0.12-0.015 MG/24HR vaginal ring Insert vaginally and leave in place for 3 consecutive weeks, then remove for 1 week. 10/12/16  Yes Shelly Bombard, MD  ferrous sulfate 325 (65 FE) MG tablet Take 1 tablet (325 mg total) by mouth daily with breakfast. 10/12/16  Yes Shelly Bombard, MD  fluticasone (FLONASE) 50 MCG/ACT nasal spray PLACE 1 SPRAY INTO BOTH NOSTRILS DAILY. 11/10/17 12/10/17 Yes Oda Kilts, MD  gabapentin (NEURONTIN) 300 MG capsule TAKE 2 CAPSULES BY MOUTH TWICE A DAY Patient taking differently: Take 600 mg by mouth 2 (two) times daily. TAKE 2 CAPSULES BY MOUTH TWICE A DAY 11/10/17  Yes Oda Kilts, MD  levothyroxine (SYNTHROID, LEVOTHROID) 150 MCG tablet Take 1 tablet (150 mcg total) by mouth daily. 09/28/17  Yes Hoffman, Jessica Ratliff, DO  levETIRAcetam (KEPPRA) 500 MG tablet Take 1 tablet (500 mg total) by mouth 2 (two) times daily. 11/16/17   Dacia Capers A, PA-C  montelukast (SINGULAIR) 10 MG tablet Take 1 tablet (10 mg total) by mouth at bedtime. Patient not taking: Reported on 11/16/2017 10/18/17   Katherine Roan, MD  potassium chloride SA (K-DUR,KLOR-CON) 20 MEQ tablet Take 1 tablet (20 mEq total) by mouth 2 (two) times daily for 5 days. 11/16/17 11/21/17  Banjamin Stovall A, PA-C  rosuvastatin (CRESTOR) 20 MG tablet Take 1 tablet (20 mg total) by mouth daily. Patient not taking: Reported on 08/04/2017 12/19/16   Valinda Party, DO    Family History Family History  Problem Relation Age of Onset  . Hypertension Mother     Social History Social History   Tobacco Use  . Smoking status: Former Smoker    Types: Cigarettes    Last attempt to quit: 08/17/2014    Years since quitting: 3.2  . Smokeless tobacco: Never Used  Substance Use Topics  . Alcohol use: No    Alcohol/week: 0.0 oz  . Drug use: Yes    Types: Marijuana, Cocaine    Comment: Reports former substance use Hx, no current use     Allergies   Ibuprofen   Review of  Systems Review of Systems  Constitutional: Negative for activity change, chills and fever.  Respiratory: Negative for shortness of breath.   Cardiovascular: Negative for chest pain.  Gastrointestinal: Negative for abdominal pain, diarrhea, nausea and vomiting.  Genitourinary: Negative for dysuria.  Musculoskeletal: Negative for arthralgias, back pain, myalgias and neck pain.  Skin: Negative for rash.  Allergic/Immunologic: Negative for immunocompromised state.  Neurological: Positive for seizures and headaches. Negative for dizziness, syncope, weakness, light-headedness and numbness.  Psychiatric/Behavioral: Positive for dysphoric mood. Negative for confusion.   Physical Exam Updated Vital Signs BP 136/85   Pulse 67   Temp 98 F (36.7 C) (Oral)   Resp 18   Ht 5\' 4"  (1.626 m)   Wt 72.6 kg (160 lb)   SpO2 99%   BMI 27.46  kg/m   Physical Exam  Constitutional: She is oriented to person, place, and time. No distress.  HENT:  Head: Normocephalic.  Right Ear: External ear normal.  Mouth/Throat: Oropharynx is clear and moist. No oropharyngeal exudate.  Eyes: Pupils are equal, round, and reactive to light. Conjunctivae and EOM are normal.  Neck: Neck supple.  Cardiovascular: Normal rate, regular rhythm, normal heart sounds and intact distal pulses. Exam reveals no gallop and no friction rub.  No murmur heard. Pulmonary/Chest: Effort normal and breath sounds normal. No stridor. No respiratory distress. She has no wheezes. She has no rales. She exhibits no tenderness.  Abdominal: Soft. Bowel sounds are normal. She exhibits no distension and no mass. There is no tenderness. There is no rebound and no guarding. No hernia.  Neurological: She is alert and oriented to person, place, and time.  Cranial nerves II through XII are grossly intact.  GCS 15.  Moves all 4 extremities.  5 out of 5 strength against resistance of the bilateral upper and lower extremities.  Sensation is intact and  symmetric throughout.  No pronator drift.  Finger-to-nose is intact and symmetric.  No clonus bilaterally.  Skin: Skin is warm. No rash noted.  Psychiatric: Her behavior is normal.  Nursing note and vitals reviewed.  ED Treatments / Results  Labs (all labs ordered are listed, but only abnormal results are displayed) Labs Reviewed  TSH - Abnormal; Notable for the following components:      Result Value   TSH 20.728 (*)    All other components within normal limits  T4, FREE - Abnormal; Notable for the following components:   Free T4 0.51 (*)    All other components within normal limits  BASIC METABOLIC PANEL - Abnormal; Notable for the following components:   Potassium 3.1 (*)    Creatinine, Ser 1.22 (*)    GFR calc non Af Amer 53 (*)    All other components within normal limits  CBG MONITORING, ED  I-STAT BETA HCG BLOOD, ED ([facility], [facility], [facility] ONLY)    EKG None  Radiology No results found.  Procedures Procedures (including critical care time)  Medications Ordered in ED Medications  levETIRAcetam (KEPPRA) IVPB 500 mg/100 mL premix (0 mg Intravenous Stopped 11/16/17 1623)  sodium chloride 0.9 % bolus 1,000 mL (0 mLs Intravenous Stopped 11/16/17 1623)  ondansetron (ZOFRAN) injection 4 mg (4 mg Intravenous Given 11/16/17 1523)     Initial Impression / Assessment and Plan / ED Course  I have reviewed the triage vital signs and the nursing notes.  Pertinent labs & imaging results that were available during my care of the patient were reviewed by me and considered in my medical decision making (see chart for details).     47 year old female with a history of depression, seizures, Graves' disease, and HTN who sitting by EMS after a witnessed seizure.  Patient was previously diagnosed with a seizure disorder.  She has been noncompliant with her home medications.  Labs with mild hypokalemia of 3.1. Glucose is normal.  TSH and free T4 continue to be abnormal.  She is followed by internal  medicine for this and has good follow-up.  Her doses were recently adjusted during her last visit on 7/3.  Given her noncompliance with other medications, I question compliance with her thyroid medications.  We also had a lengthy discussion regarding avoiding driving until she is reevaluated by neurology.  Loading dose of Keppra and IV fluid bolus given in the ED.  Will  provide the patient with a one-month supply and replenish her potassium chloride. I think the patient would also benefit from following up with outpatient mental health services but does not wish to discuss this around her mother or her daughter at this time.  Denies SI or HI. Strict return precautions given.  She is hemodynamically stable and in no acute distress.  She is safe for discharge home with outpatient follow-up at this time.  Final Clinical Impressions(s) / ED Diagnoses   Final diagnoses:  Seizures [facility])    ED Discharge Orders        Ordered    levETIRAcetam (KEPPRA) 500 MG tablet  2 times daily     11/16/17 1612    potassium chloride SA (K-DUR,KLOR-CON) 20 MEQ tablet  2 times daily     11/16/17 1612       Cloria Ciresi A, PA-C 11/16/17 1629    Julianne Rice, MD 11/18/17 1557

## 2017-11-27 DIAGNOSIS — F331 Major depressive disorder, recurrent, moderate: Secondary | ICD-10-CM | POA: Diagnosis not present

## 2017-11-28 ENCOUNTER — Other Ambulatory Visit: Payer: Self-pay | Admitting: Internal Medicine

## 2017-11-28 DIAGNOSIS — L508 Other urticaria: Secondary | ICD-10-CM

## 2017-12-04 ENCOUNTER — Other Ambulatory Visit: Payer: Self-pay

## 2017-12-04 ENCOUNTER — Ambulatory Visit (INDEPENDENT_AMBULATORY_CARE_PROVIDER_SITE_OTHER): Payer: Medicaid Other | Admitting: Neurology

## 2017-12-04 ENCOUNTER — Encounter: Payer: Self-pay | Admitting: Neurology

## 2017-12-04 VITALS — BP 122/78 | HR 65 | Ht 64.0 in | Wt 163.0 lb

## 2017-12-04 DIAGNOSIS — R202 Paresthesia of skin: Secondary | ICD-10-CM | POA: Diagnosis not present

## 2017-12-04 DIAGNOSIS — R569 Unspecified convulsions: Secondary | ICD-10-CM | POA: Diagnosis not present

## 2017-12-04 DIAGNOSIS — G43009 Migraine without aura, not intractable, without status migrainosus: Secondary | ICD-10-CM

## 2017-12-04 MED ORDER — NORTRIPTYLINE HCL 25 MG PO CAPS: ORAL_CAPSULE | ORAL | 11 refills | Status: DC

## 2017-12-04 MED ORDER — LEVETIRACETAM 1000 MG PO TABS: 1000.0000 mg | ORAL_TABLET | Freq: Two times a day (BID) | ORAL | 11 refills | Status: DC

## 2017-12-04 NOTE — Progress Notes (Signed)
NEUROLOGY FOLLOW UP OFFICE NOTE  Jill Hopkins [id number] 1970/06/04  HISTORY OF PRESENT ILLNESS: I had the pleasure of seeing Jill Hopkins in follow-up in the neurology clinic on 12/04/2017.  The patient was last seen a year ago for frequent falls. She is alone in the office today and presents for evaluation of seizures. Records and images were personally reviewed where available.  On her initial visit in August 2018, she reported frequent falls preceded by sweating, dizziness, nausea. No shaking reported. She was orthostatic on initial evaluation. Her MRI brain done 11/2016 had shown a nonacute right cerebellar stroke new from 2016. She did not proceed with stroke workup (echo, carotids) ordered at that time. Hypercoagulable panel was negative. MRI also showed chiari malformation, we ordered an MRI of cervical/thoracic/lumbar spine to assess for syrinx. She had the cervical and thoracic MRI which showed chronic spondylosis with endplate osteophytes and broad-based disc herniation, effacement of the ventral subarachnoid space with indentation of the cord, AP diameter only 40mm, concerning for risk of chronic cord injury. She was referred to Neurosurgery and tells me that surgery was recommended but she declined.   She presents today for new symptoms of shaking spells. She was in the ER on 08/04/17 with report of convulsion and was started on Keppra. She was back to the ER on 11/16/17 with report of jerking for 5 minutes. She also reported auditory and visual hallucinations for 6 months. Her daughter reported hearing the patient whispering and talking to herself. She states she has no warning prior to the episodes, waking up with EMS around her. She has bit her tongue and wet herself with these. She has headaches daily and does not take any prn medication. She is also taking Gabapentin 300mg  2 tabs BID with no side effects. She continues to have paresthesias in her upper extremities but denies any weakness.  She has neck and back pain.  History on Initial Assessment 12/02/2016: This is a pleasant 47 year old right-handed woman with a history of hypertension, depression, renal disorder, thyroid disease, presenting for evaluation of frequent falls. She states that falls started a year or so ago, but have increased in the past 6 months. She has a warning before she falls, she would start sweating, feel dizzy and nauseated, and can alert family. If she does not sit down, she would go down. She reports falling daily last weekend. Her daughter describes one of the falls 2 weeks ago where she alerted her daughter, asking for water, her daughter tried to hold her up then she fell and almost cracked her head on the corner of the kitchen table. She apparently passed out and daughter called for help, when patient's mother arrived, they tried to help her but she fell again in the hallway. She reports her balance is just not good, she sometimes just trips over something and can't stop herself from going down. She has a history of neck and back pain for many years, she has had tingling in her fingers, arms and knees down to her feet. She had been prescribed gabapentin 3-4 years ago but lost her insurance and was unable to restart gabapentin 300mg  BID until 2 months ago. No side effects on current dose. She has injured herself with cuts and bruises on her back, neck, legs, and arms, but also she reports her pride is injured when she falls in front of someone. She would usually tell family she is okay and ask them to leave her alone.  She  has a history of migraines for the past 10 years, worse in the last 7-8 months, with pain over the temporal and vertex region, associated nausea/vomiting, photo and phonophobia. Tylenol does not help. She has a history of depression and was restarted on Abilify and Wellbutrin last June. Her mother reports that she gets overexcited sometimes with mood swings, better since medications restarted. She  has been unable to see her prior psychiatrist due to insurance change. She reports sleeping 12-14 hours at night, but sometimes from Friday to Sunday she would not sleep at all, her mind is running fast and there is "just a lot of it." She denies any alcohol or drug intake.   Diagnostic Data:  I personally reviewed MRI brain without contrast done 8/10/18which did not show any acute changes. There were new nonacute small right cerebellar infarcts not present on MRI in 2016. She denies any history of stroke. There is an increase in chronic microvascular disease. There is a mild Chiari malformation with cerebellar tonsils 25mm below foramen magnum, mildly pointed appearance on the right. 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   EEG done 06/2014 was normal  PAST MEDICAL HISTORY: Past Medical History:  Diagnosis Date  . Depression   . Hypertension   . Renal disorder   . Seizures ([place])   . Thyroid disease     MEDICATIONS: Current Outpatient Medications on File Prior to Visit  Medication Sig Dispense Refill  . albuterol (PROVENTIL HFA;VENTOLIN HFA) 108 (90 Base) MCG/ACT inhaler Inhale 1-2 puffs into the lungs every 6 (six) hours as needed for wheezing or shortness of breath. 1 Inhaler 0  . amLODipine (NORVASC) 10 MG tablet Take 1 tablet (10 mg total) by mouth daily. 90 tablet 4  . ARIPiprazole (ABILIFY) 2 MG tablet Take 1 tablet (2 mg total) by mouth daily. 30 tablet 2  . benzonatate (TESSALON) 100 MG capsule Take 1 capsule (100 mg total) by mouth 3 (three) times daily as needed for cough. 20 capsule 0  . buPROPion (WELLBUTRIN SR) 150 MG 12 hr tablet TAKE 1 TABLET BY MOUTH TWICE A DAY (Patient taking differently: TAKE 1 TABLET BY MOUTH BID) 60 tablet 1  . cetirizine (ZYRTEC) 10 MG tablet TAKE 1 TABLET BY MOUTH EVERY DAY 90 tablet 0  . etonogestrel-ethinyl estradiol (NUVARING) 0.12-0.015 MG/24HR vaginal ring Insert vaginally and leave in place for 3 consecutive weeks, then  remove for 1 week. 1 each 12  . ferrous sulfate 325 (65 FE) MG tablet Take 1 tablet (325 mg total) by mouth daily with breakfast. 60 tablet 11  . fluticasone (FLONASE) 50 MCG/ACT nasal spray PLACE 1 SPRAY INTO BOTH NOSTRILS DAILY. 16 g 0  . gabapentin (NEURONTIN) 300 MG capsule TAKE 2 CAPSULES BY MOUTH TWICE A DAY (Patient taking differently: Take 600 mg by mouth 2 (two) times daily. TAKE 2 CAPSULES BY MOUTH TWICE A DAY) 120 capsule 0  . levETIRAcetam (KEPPRA) 500 MG tablet Take 1 tablet (500 mg total) by mouth 2 (two) times daily. 60 tablet 0  . levothyroxine (SYNTHROID, LEVOTHROID) 150 MCG tablet Take 1 tablet (150 mcg total) by mouth daily. 30 tablet 5  . montelukast (SINGULAIR) 10 MG tablet Take 1 tablet (10 mg total) by mouth at bedtime. (Patient not taking: Reported on 11/16/2017) 30 tablet 0  . potassium chloride SA (K-DUR,KLOR-CON) 20 MEQ tablet Take 1 tablet (20 mEq total) by mouth 2 (two) times daily for 5 days. 10 tablet 0  . ranitidine (ZANTAC)  150 MG capsule TAKE 1 CAPSULE BY MOUTH TWICE A DAY 180 capsule 0  . rosuvastatin (CRESTOR) 20 MG tablet Take 1 tablet (20 mg total) by mouth daily. (Patient not taking: Reported on 08/04/2017) 30 tablet 2   No current facility-administered medications on file prior to visit.     ALLERGIES: Allergies  Allergen Reactions  . Ibuprofen Nausea And Vomiting    FAMILY HISTORY: Family History  Problem Relation Age of Onset  . Hypertension Mother     SOCIAL HISTORY: Social History   Socioeconomic History  . Marital status: Single    Spouse name: Not on file  . Number of children: 1  . Years of education: 37  . Highest education level: Not on file  Occupational History  . Not on file  Social Needs  . Financial resource strain: Not on file  . Food insecurity:    Worry: Not on file    Inability: Not on file  . Transportation needs:    Medical: Not on file    Non-medical: Not on file  Tobacco Use  . Smoking status: Former Smoker     Types: Cigarettes    Last attempt to quit: 08/17/2014    Years since quitting: 3.3  . Smokeless tobacco: Never Used  Substance and Sexual Activity  . Alcohol use: No    Alcohol/week: 0.0 standard drinks  . Drug use: Yes    Types: Marijuana, Cocaine    Comment: Reports former substance use Hx, no current use  . Sexual activity: Yes    Partners: Male    Birth control/protection: Inserts  Lifestyle  . Physical activity:    Days per week: Not on file    Minutes per session: Not on file  . Stress: Not on file  Relationships  . Social connections:    Talks on phone: Not on file    Gets together: Not on file    Attends religious service: Not on file    Active member of club or organization: Not on file    Attends meetings of clubs or organizations: Not on file    Relationship status: Not on file  . Intimate partner violence:    Fear of current or ex partner: Not on file    Emotionally abused: Not on file    Physically abused: Not on file    Forced sexual activity: Not on file  Other Topics Concern  . Not on file  Social History Narrative   Lives in 1 story home with mother and 1 child   Highest level of education: HS graduate   Unemployed x3 years   Caffeine use: 1 cup tea per day    REVIEW OF SYSTEMS: Constitutional: No fevers, chills, or sweats, no generalized fatigue, change in appetite Eyes: No visual changes, double vision, eye pain Ear, nose and throat: No hearing loss, ear pain, nasal congestion, sore throat Cardiovascular: No chest pain, palpitations Respiratory:  No shortness of breath at rest or with exertion, wheezes GastrointestinaI: No nausea, vomiting, diarrhea, abdominal pain, fecal incontinence Genitourinary:  No dysuria, urinary retention or frequency Musculoskeletal:  + neck pain, back pain Integumentary: No rash, pruritus, skin lesions Neurological: as above Psychiatric: No depression, insomnia, anxiety Endocrine: No palpitations, fatigue, diaphoresis,  mood swings, change in appetite, change in weight, increased thirst Hematologic/Lymphatic:  No anemia, purpura, petechiae. Allergic/Immunologic: no itchy/runny eyes, nasal congestion, recent allergic reactions, rashes  PHYSICAL EXAM: Vitals:   12/04/17 1521  BP: 122/78  Pulse: 65  SpO2: 99%  General: No acute distress Head:  Normocephalic/atraumatic Neck: supple, no paraspinal tenderness, full range of motion Heart:  Regular rate and rhythm Lungs:  Clear to auscultation bilaterally Back: No paraspinal tenderness Skin/Extremities: No rash, no edema Neurological Exam: alert and oriented to person, place, and time. No aphasia or dysarthria. Fund of knowledge is appropriate.  Recent and remote memory are intact.  Attention and concentration are normal.    Able to name objects and repeat phrases. Cranial nerves: Pupils equal, round, reactive to light.  Extraocular movements intact with no nystagmus. Visual fields full. Facial sensation intact. No facial asymmetry. Tongue, uvula, palate midline.  Motor: Bulk and tone normal, muscle strength 5/5 throughout with no pronator drift.  Sensation to light touch, temperature and vibration intact.  No extinction to double simultaneous stimulation.  Deep tendon reflexes 2+ throughout, toes downgoing.  Finger to nose testing intact.  Gait narrow-based and steady, able to tandem walk adequately.  Romberg negative.  IMPRESSION: This is a pleasant 47 yo RH woman with a history of hypertension, depression, renal disorder, thyroid disease, who initially presented in August 2018 for frequent falls suggestive of vasovagal syncope/presyncope. She was lost to follow-up for a year and presents with new onset convulsions with tongue bite and incontinence. MRI brain in 2018 showed chronic microvascular disease and old right cerebellar infarcts. She did not do stroke workup. She is now on Keppra for the convulsions, a 48-hour EEG will be done to further classify her  seizures. Increase Keppra to 1000mg  BID. She continues to report daily headaches on gabapentin 600mg  BID, and will start nortriptyline 25mg  qhs x 1 week, then increase to 50mg  qhs x 1 week, then 75mg  qhs. Side effects discussed. Prior MRI cervical spine showed findings concerning for risk of chronic cord injury, she was advised to follow-up with her Neurosurgeon. We discussed [place] driving laws, no driving until 6 months seizure-free. Follow-up in 3-4 months, she knows to call for any changes.   Thank you for allowing me to participate in her care.  Please do not hesitate to call for any questions or concerns.  The duration of this appointment visit was 40 minutes of face-to-face time with the patient.  Greater than 50% of this time was spent in counseling, explanation of diagnosis, planning of further management, and coordination of care.   Ellouise Newer, M.D.   CC: Dr. Heber Stilwell

## 2017-12-04 NOTE — Patient Instructions (Signed)
1. Increase Levetiracetam to 1000mg  twice a day. With your current bottle of Levetiracetam 500mg , take 2 tablets twice a day. Once finished, your new bottle will be for Levetiracetam 1000mg , take 1 tablet twice a day  2. Start nortriptyline 25mg : Take 1 capsule every night for 1 week, then increase to 2 capsules every night for 1 week, then increase to 3 capsules every night and continue  3. Schedule 48-hour EEG  4. Schedule follow-up with your Neurosurgeon   5. Follow-up in 3-4 months, call for any changes  Seizure Precautions: 1. If medication has been prescribed for you to prevent seizures, take it exactly as directed.  Do not stop taking the medicine without talking to your doctor first, even if you have not had a seizure in a long time.   2. Avoid activities in which a seizure would cause danger to yourself or to others.  Don't operate dangerous machinery, swim alone, or climb in high or dangerous places, such as on ladders, roofs, or girders.  Do not drive unless your doctor says you may.  3. If you have any warning that you may have a seizure, lay down in a safe place where you can't hurt yourself.    4.  No driving for 6 months from last seizure, as per [facility].   Please refer to the following link on the [place] website for more information: http://www.epilepsyfoundation.org/answerplace/Social/driving/drivingu.cfm   5.  Maintain good sleep hygiene. Avoid alcohol.  6.  Notify your neurology if you are planning pregnancy or if you become pregnant.  7.  Contact your doctor if you have any problems that may be related to the medicine you are taking.  8.  Call 911 and bring the patient back to the ED if:        A.  The seizure lasts longer than 5 minutes.       B.  The patient doesn't awaken shortly after the seizure  C.  The patient has new problems such as difficulty seeing, speaking or moving  D.  The patient was injured during the  seizure  E.  The patient has a temperature over 102 F (39C)  F.  The patient vomited and now is having trouble breathing

## 2017-12-08 ENCOUNTER — Encounter: Payer: Self-pay | Admitting: Neurology

## 2017-12-11 ENCOUNTER — Other Ambulatory Visit: Payer: Self-pay | Admitting: Internal Medicine

## 2017-12-11 DIAGNOSIS — L508 Other urticaria: Secondary | ICD-10-CM

## 2017-12-25 DIAGNOSIS — F331 Major depressive disorder, recurrent, moderate: Secondary | ICD-10-CM | POA: Diagnosis not present

## 2017-12-30 ENCOUNTER — Other Ambulatory Visit: Payer: Self-pay | Admitting: Obstetrics

## 2017-12-30 DIAGNOSIS — D500 Iron deficiency anemia secondary to blood loss (chronic): Secondary | ICD-10-CM

## 2017-12-30 DIAGNOSIS — Z30015 Encounter for initial prescription of vaginal ring hormonal contraceptive: Secondary | ICD-10-CM

## 2018-01-01 ENCOUNTER — Ambulatory Visit (INDEPENDENT_AMBULATORY_CARE_PROVIDER_SITE_OTHER): Payer: Medicaid Other | Admitting: Neurology

## 2018-01-01 ENCOUNTER — Other Ambulatory Visit: Payer: Medicaid Other

## 2018-01-01 DIAGNOSIS — R569 Unspecified convulsions: Secondary | ICD-10-CM | POA: Diagnosis not present

## 2018-01-01 DIAGNOSIS — R202 Paresthesia of skin: Secondary | ICD-10-CM

## 2018-01-01 DIAGNOSIS — G43009 Migraine without aura, not intractable, without status migrainosus: Secondary | ICD-10-CM

## 2018-01-02 DIAGNOSIS — R569 Unspecified convulsions: Secondary | ICD-10-CM | POA: Diagnosis not present

## 2018-01-02 DIAGNOSIS — G43009 Migraine without aura, not intractable, without status migrainosus: Secondary | ICD-10-CM | POA: Diagnosis not present

## 2018-01-02 DIAGNOSIS — R202 Paresthesia of skin: Secondary | ICD-10-CM | POA: Diagnosis not present

## 2018-01-14 NOTE — Procedures (Signed)
ELECTROENCEPHALOGRAM REPORT  Dates of Recording: 01/01/2018 2:31PM to 10/02/2017 8:48PM  Patient's Name: Jill Hopkins MRN: [id number] Date of Birth: [birth year]  Referring Provider: Dr. Ellouise Newer  Procedure: 48-hour ambulatory EEG (patient started pulling off electrodes with only 30:17 hours of readable EEG)  History: This is a 47 year old woman with new onset convulsions with tongue bite and incontinence. EEG for classification.  Medications:  NEURONTIN 300 MG capsule   KEPPRA 500 MG tablet  PROVENTIL HFA;VENTOLIN HFA 108 (90 Base) MCG/ACT inhaler NORVASC 10 MG tablet  ABILIFY 2 MG tablet  TESSALON 100 MG capsule  WELLBUTRIN SR 150 MG 12 hr tablet  ZYRTEC 10 MG tablet  NUVARING 0.12-0.015 MG/24HR vaginal ring  65 FE MG tablet  FLONASE 50 MCG/ACT nasal spray  SYNTHROID, LEVOTHROID 150 MCG tablet  SINGULAIR 10 MG tablet  K-DUR,KLOR-CON 20 MEQ tablet  ZANTAC 150 MG capsule  CRESTOR 20 MG tablet    Technical Summary: This is a 30 hour and 17 minute multichannel digital EEG recording measured by the international 10-20 system with electrodes applied with paste and impedances below 5000 ohms performed as portable with EKG monitoring.  The digital EEG was referentially recorded, reformatted, and digitally filtered in a variety of bipolar and referential montages for optimal display.    DESCRIPTION OF RECORDING: During maximal wakefulness, the background activity consisted of a symmetric 9.5-10 Hz posterior dominant rhythm which was reactive to eye opening.  There were no epileptiform discharges or focal slowing seen in wakefulness.  During the recording, the patient progresses through wakefulness, drowsiness, and Stage 2 sleep.  Again, there were no epileptiform discharges seen.  Events: There were 2 push button events on 09/17 at 1136 hours and 1232 hours, however patient did not complete diary. Electrographically, there were no EEG or EKG changes seen.  There were no  electrographic seizures seen.  EKG lead was unremarkable.  IMPRESSION: This 30-hour ambulatory EEG study is normal.    CLINICAL CORRELATION: A normal EEG does not exclude a clinical diagnosis of epilepsy. Typical events were not captured. If further clinical questions remain, inpatient video EEG monitoring may be helpful.   Ellouise Newer, M.D.

## 2018-01-17 ENCOUNTER — Other Ambulatory Visit: Payer: Self-pay | Admitting: Internal Medicine

## 2018-01-19 ENCOUNTER — Other Ambulatory Visit: Payer: Self-pay | Admitting: *Deleted

## 2018-01-22 MED ORDER — FLUTICASONE PROPIONATE 50 MCG/ACT NA SUSP: 1.0000 | Freq: Every day | NASAL | 0 refills | Status: DC

## 2018-01-24 ENCOUNTER — Other Ambulatory Visit: Payer: Self-pay | Admitting: *Deleted

## 2018-01-24 NOTE — Telephone Encounter (Signed)
Next appt scheduled 10/17 with PCP. 

## 2018-01-29 DIAGNOSIS — F331 Major depressive disorder, recurrent, moderate: Secondary | ICD-10-CM | POA: Diagnosis not present

## 2018-01-29 MED ORDER — GABAPENTIN 300 MG PO CAPS: ORAL_CAPSULE | ORAL | 0 refills | Status: DC

## 2018-02-01 ENCOUNTER — Ambulatory Visit: Payer: Medicaid Other | Admitting: Internal Medicine

## 2018-02-01 ENCOUNTER — Encounter: Payer: Self-pay | Admitting: Internal Medicine

## 2018-02-01 ENCOUNTER — Other Ambulatory Visit: Payer: Self-pay

## 2018-02-01 VITALS — BP 148/96 | HR 67 | Temp 98.1°F | Ht 64.0 in | Wt 163.3 lb

## 2018-02-01 DIAGNOSIS — Z79890 Hormone replacement therapy (postmenopausal): Secondary | ICD-10-CM

## 2018-02-01 DIAGNOSIS — J069 Acute upper respiratory infection, unspecified: Secondary | ICD-10-CM | POA: Diagnosis not present

## 2018-02-01 DIAGNOSIS — F332 Major depressive disorder, recurrent severe without psychotic features: Secondary | ICD-10-CM

## 2018-02-01 DIAGNOSIS — F329 Major depressive disorder, single episode, unspecified: Secondary | ICD-10-CM | POA: Diagnosis not present

## 2018-02-01 DIAGNOSIS — E039 Hypothyroidism, unspecified: Secondary | ICD-10-CM

## 2018-02-01 DIAGNOSIS — Z79899 Other long term (current) drug therapy: Secondary | ICD-10-CM | POA: Diagnosis not present

## 2018-02-01 DIAGNOSIS — I10 Essential (primary) hypertension: Secondary | ICD-10-CM

## 2018-02-01 DIAGNOSIS — G4700 Insomnia, unspecified: Secondary | ICD-10-CM | POA: Diagnosis not present

## 2018-02-01 DIAGNOSIS — E079 Disorder of thyroid, unspecified: Secondary | ICD-10-CM | POA: Diagnosis not present

## 2018-02-01 MED ORDER — CHLORTHALIDONE 25 MG PO TABS: 25.0000 mg | ORAL_TABLET | Freq: Every day | ORAL | 2 refills | Status: AC

## 2018-02-01 MED ORDER — RANITIDINE HCL 150 MG PO CAPS: 150.0000 mg | ORAL_CAPSULE | Freq: Two times a day (BID) | ORAL | 0 refills | Status: AC

## 2018-02-01 MED ORDER — BENZONATATE 100 MG PO CAPS: 100.0000 mg | ORAL_CAPSULE | Freq: Three times a day (TID) | ORAL | 1 refills | Status: AC | PRN

## 2018-02-01 MED ORDER — FLUTICASONE-SALMETEROL 100-50 MCG/DOSE IN AEPB: 1.0000 | INHALATION_SPRAY | Freq: Two times a day (BID) | RESPIRATORY_TRACT | 2 refills | Status: AC

## 2018-02-01 NOTE — Patient Instructions (Addendum)
Jill Hopkins,  It is a pleasure seeing you today. Freer cold please start using Advair inhaler twice a day, Nasonex and Tessalon Perles ( for cough) these medications have been sent to your pharmacy. Pulmonary function tests have been ordered to better assess her lung function and if you need any additional medications.  For your insomnia  the medications used interact with your seizure medication. It will be important to discuss this with your neurologist to see if there is anything they suggest. In the meantime please read the information below to help with sleep.  Your blood pressure was elevated today and chlorthalidone was added to your regimen. Please take chlorthalidone and amlodipine once daily.   Insomnia Insomnia is a sleep disorder that makes it difficult to fall asleep or to stay asleep. Insomnia can cause tiredness (fatigue), low energy, difficulty concentrating, mood swings, and poor performance at work or school. There are three different ways to classify insomnia:  Difficulty falling asleep.  Difficulty staying asleep.  Waking up too early in the morning.  Any type of insomnia can be long-term (chronic) or short-term (acute). Both are common. Short-term insomnia usually lasts for three months or less. Chronic insomnia occurs at least three times a week for longer than three months. What are the causes? Insomnia may be caused by another condition, situation, or substance, such as:  Anxiety.  Certain medicines.  Gastroesophageal reflux disease (GERD) or other gastrointestinal conditions.  Asthma or other breathing conditions.  Restless legs syndrome, sleep apnea, or other sleep disorders.  Chronic pain.  Menopause. This may include hot flashes.  Stroke.  Abuse of alcohol, tobacco, or illegal drugs.  Depression.  Caffeine.  Neurological disorders, such as Alzheimer disease.  An overactive thyroid (hyperthyroidism).  The cause of insomnia may not be  known. What increases the risk? Risk factors for insomnia include:  Gender. Women are more commonly affected than men.  Age. Insomnia is more common as you get older.  Stress. This may involve your professional or personal life.  Income. Insomnia is more common in people with lower income.  Lack of exercise.  Irregular work schedule or night shifts.  Traveling between different time zones.  What are the signs or symptoms? If you have insomnia, trouble falling asleep or trouble staying asleep is the main symptom. This may lead to other symptoms, such as:  Feeling fatigued.  Feeling nervous about going to sleep.  Not feeling rested in the morning.  Having trouble concentrating.  Feeling irritable, anxious, or depressed.  How is this treated? Treatment for insomnia depends on the cause. If your insomnia is caused by an underlying condition, treatment will focus on addressing the condition. Treatment may also include:  Medicines to help you sleep.  Counseling or therapy.  Lifestyle adjustments.  Follow these instructions at home:  Take medicines only as directed by your health care provider.  Keep regular sleeping and waking hours. Avoid naps.  Keep a sleep diary to help you and your health care provider figure out what could be causing your insomnia. Include: ? When you sleep. ? When you wake up during the night. ? How well you sleep. ? How rested you feel the next day. ? Any side effects of medicines you are taking. ? What you eat and drink.  Make your bedroom a comfortable place where it is easy to fall asleep: ? Put up shades or special blackout curtains to block light from outside. ? Use a white noise machine to block noise. ?  Keep the temperature cool.  Exercise regularly as directed by your health care provider. Avoid exercising right before bedtime.  Use relaxation techniques to manage stress. Ask your health care provider to suggest some techniques  that may work well for you. These may include: ? Breathing exercises. ? Routines to release muscle tension. ? Visualizing peaceful scenes.  Cut back on alcohol, caffeinated beverages, and cigarettes, especially close to bedtime. These can disrupt your sleep.  Do not overeat or eat spicy foods right before bedtime. This can lead to digestive discomfort that can make it hard for you to sleep.  Limit screen use before bedtime. This includes: ? Watching TV. ? Using your smartphone, tablet, and computer.  Stick to a routine. This can help you fall asleep faster. Try to do a quiet activity, brush your teeth, and go to bed at the same time each night.  Get out of bed if you are still awake after 15 minutes of trying to sleep. Keep the lights down, but try reading or doing a quiet activity. When you feel sleepy, go back to bed.  Make sure that you drive carefully. Avoid driving if you feel very sleepy.  Keep all follow-up appointments as directed by your health care provider. This is important. Contact a health care provider if:  You are tired throughout the day or have trouble in your daily routine due to sleepiness.  You continue to have sleep problems or your sleep problems get worse. Get help right away if:  You have serious thoughts about hurting yourself or someone else. This information is not intended to replace advice given to you by your health care provider. Make sure you discuss any questions you have with your health care provider. Document Released: 04/01/2000 Document Revised: 09/04/2015 Document Reviewed: 01/03/2014 Elsevier Interactive Patient Education  [employer].

## 2018-02-01 NOTE — Progress Notes (Signed)
   CC: Follow up on essential hypertension  HPI:  [name] is a 47 y.o. female with history noted below that presents to the Internal Medicine Clinic for follow up of essential hypertension.   Past Medical History:  Diagnosis Date  . Depression   . Hypertension   . Renal disorder   . Seizures ([place])   . Thyroid disease     Review of Systems:  Review of Systems  Respiratory: Positive for cough, sputum production and wheezing. Negative for shortness of breath.   Cardiovascular: Negative for chest pain and leg swelling.  Gastrointestinal: Positive for nausea and vomiting.     Physical Exam:  Vitals:   02/01/18 1350  BP: (!) 148/96  Pulse: 67  Temp: 98.1 F (36.7 C)  TempSrc: Oral  SpO2: 100%  Weight: 163 lb 4.8 oz (74.1 kg)  Height: 5\' 4"  (1.626 m)   Physical Exam  Constitutional: She is well-developed, well-nourished, and in no distress.  Cardiovascular: Normal rate, regular rhythm and normal heart sounds. Exam reveals no gallop and no friction rub.  No murmur heard. Pulmonary/Chest: Effort normal and breath sounds normal. No respiratory distress. She has no wheezes.     Assessment & Plan:   See encounters tab for problem based medical decision making.   Patient discussed with Dr. Evette Doffing

## 2018-02-02 LAB — TSH: TSH: 39.54 u[IU]/mL — ABNORMAL HIGH (ref 0.450–4.500)

## 2018-02-03 DIAGNOSIS — G4700 Insomnia, unspecified: Secondary | ICD-10-CM | POA: Insufficient documentation

## 2018-02-03 NOTE — Assessment & Plan Note (Signed)
Assessment: Essential hypertension Patient is currently taking amlodipine 10 mg daily.  blood pressure today is 148/96.  Will add chlorthalidone.  Plan -start chlorthalidone 25mg  daily -continue amlodipine 10mg  daily - f/u in one month

## 2018-02-03 NOTE — Assessment & Plan Note (Signed)
Assessment: Insomnia Patient states she has difficulty falling asleep and staying asleep.  She asked for [place].  Unfortunately patient takes keppra and Ambien is not advised. At this time as recommended following up with her neurologist to discuss the best sleeping aid when used in combination with Keppra. Also gave patient information on sleep hygiene.  Plan -handout on sleep hygiene

## 2018-02-03 NOTE — Assessment & Plan Note (Signed)
  Assessment:  URI Patient states she has been coughing and wheezing for the past 3 weeks. She has clear productive sputum.  She has some associated nausea and vomiting from postnasal drip.  Patient reports improvement with zyrtec and albuterol inhaler.  At this time will add a steroid inhaler, tessalon pearls and obtain PFTs.  Told patient to call back if not improving.  Plan - advair -tessalon pearls -pfts

## 2018-02-03 NOTE — Assessment & Plan Note (Signed)
Assessment:  Depression Patient is following with [facility] psychiat[facility].  Currently taking abilify and pristiq.  PHQ-9 22, denies suicidal ideation. Recommended following up with psychiatrist for continued medication surveillance.  Plan -encouraged follow up with psychiatrist

## 2018-02-03 NOTE — Assessment & Plan Note (Signed)
Assessment:  Hypothyroidism TSH 11/2017 was 20.  Patient states she takes synthroid 176mcg daily.    Plan -check tsh

## 2018-02-05 NOTE — Progress Notes (Signed)
Internal Medicine Clinic Attending  Case discussed with Dr. Hoffman at the time of the visit.  We reviewed the resident's history and exam and pertinent patient test results.  I agree with the assessment, diagnosis, and plan of care documented in the resident's note.  

## 2018-02-08 ENCOUNTER — Other Ambulatory Visit: Payer: Self-pay | Admitting: Internal Medicine

## 2018-02-08 ENCOUNTER — Telehealth: Payer: Self-pay | Admitting: Internal Medicine

## 2018-02-08 MED ORDER — LEVOTHYROXINE SODIUM 175 MCG PO TABS: 175.0000 ug | ORAL_TABLET | Freq: Every day | ORAL | 2 refills | Status: AC

## 2018-02-08 NOTE — Telephone Encounter (Signed)
Discussed TSH of 39 with patient.  Told to increase levothyroxine to 149mcg daily and new prescription sent to the pharmacy.  Patient expressed understanding

## 2018-03-07 ENCOUNTER — Emergency Department (HOSPITAL_COMMUNITY): Payer: Medicaid Other

## 2018-03-07 ENCOUNTER — Emergency Department (HOSPITAL_COMMUNITY)
Admission: EM | Admit: 2018-03-07 | Discharge: 2018-03-07 | Disposition: A | Discharge status: Home / Self Care | Payer: Medicaid Other | Attending: Emergency Medicine | Admitting: Emergency Medicine

## 2018-03-07 ENCOUNTER — Encounter (HOSPITAL_COMMUNITY): Payer: Self-pay | Admitting: *Deleted

## 2018-03-07 DIAGNOSIS — S4992XA Unspecified injury of left shoulder and upper arm, initial encounter: Secondary | ICD-10-CM | POA: Diagnosis not present

## 2018-03-07 DIAGNOSIS — Z79899 Other long term (current) drug therapy: Secondary | ICD-10-CM | POA: Insufficient documentation

## 2018-03-07 DIAGNOSIS — M25512 Pain in left shoulder: Secondary | ICD-10-CM | POA: Insufficient documentation

## 2018-03-07 DIAGNOSIS — I10 Essential (primary) hypertension: Secondary | ICD-10-CM | POA: Diagnosis not present

## 2018-03-07 DIAGNOSIS — F17210 Nicotine dependence, cigarettes, uncomplicated: Secondary | ICD-10-CM | POA: Insufficient documentation

## 2018-03-07 NOTE — ED Provider Notes (Signed)
[facility] EMERGENCY DEPARTMENT Provider Note   CSN: [id number] Arrival date & time: 03/07/18  1301     History   Chief Complaint Chief Complaint  Patient presents with  . Generalized Body Aches    HPI Jill Hopkins is a 47 y.o. female.  The history is provided by the patient.  Shoulder Pain   This is a chronic problem. The current episode started more than 1 week ago. The problem occurs daily. The problem has been gradually worsening. The pain is present in the left shoulder. The quality of the pain is described as aching. The pain is at a severity of 2/10. The pain is mild. Pertinent negatives include no numbness, full range of motion, no stiffness, no tingling and no itching. The symptoms are aggravated by contact. She has tried nothing for the symptoms. The treatment provided mild relief. There has been a history of trauma.    Past Medical History:  Diagnosis Date  . Depression   . Hypertension   . Renal disorder   . Seizures ([place])   . Thyroid disease     Patient Active Problem List   Diagnosis Date Noted  . Insomnia 02/03/2018  . Chronic urticaria 10/19/2017  . Seizure ([place]) 10/01/2017  . URI (upper respiratory infection) 10/01/2017  . Breast mass 01/31/2017  . Nodule of soft tissue 01/31/2017  . 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 CVA (cerebral vascular accident) ([place]) 11/25/2016  . Falls 10/14/2016  . Unintentional weight loss 10/14/2016  . Chronic kidney disease 09/21/2016  . Encounter for HIV (human immunodeficiency virus) test 09/21/2016  . 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 Paresthesias   . Hypertension 07/02/2014  . Thyroid disease 07/02/2014  . Depression 07/02/2014  . Excessive or frequent menstruation 03/18/2013    Past Surgical History:  Procedure Laterality Date  . CERVICAL CERCLAGE       OB History   None      Home Medications    Prior to Admission medications   Medication Sig Start Date End Date  Taking? Authorizing Provider  amLODipine (NORVASC) 10 MG tablet Take 1 tablet (10 mg total) by mouth daily. 09/28/17   Kalman Shan Ratliff, DO  ARIPiprazole (ABILIFY) 2 MG tablet Take 1 tablet (2 mg total) by mouth daily. 09/28/17   Kalman Shan Ratliff, DO  benzonatate (TESSALON PERLES) 100 MG capsule Take 1 capsule (100 mg total) by mouth 3 (three) times daily as needed for cough. 02/01/18 02/01/19  Valinda Party, DO  cetirizine (ZYRTEC) 10 MG tablet TAKE 1 TABLET BY MOUTH EVERY DAY 11/29/17   Kalman Shan Ratliff, DO  chlorthalidone (HYGROTON) 25 MG tablet Take 1 tablet (25 mg total) by mouth daily. 02/01/18   Valinda Party, DO  [facility] IRON 325 (65 Fe) MG tablet TAKE 1 TABLET BY MOUTH EVERY DAY WITH BREAKFAST 12/31/17   Shelly Bombard, MD  desvenlafaxine (PRISTIQ) 50 MG 24 hr tablet Take 50 mg by mouth daily. 11/29/17   [provider]  etonogestrel-ethinyl estradiol (NUVARING) 0.12-0.015 MG/24HR vaginal ring INSERT 1 RING VAGINALLY AS DIRECTED. REMOVE AFTER 3 WEEKS & WAIT 7 DAYS BEFORE INSERTING A NEW RING 12/31/17   Shelly Bombard, MD  fluticasone [facility]) 50 MCG/ACT nasal spray Place 1 spray into both nostrils daily. 01/22/18 02/21/18  Kalman Shan Ratliff, DO  Fluticasone-Salmeterol (ADVAIR) 100-50 MCG/DOSE AEPB Inhale 1 puff into the lungs 2 (two) times daily. 02/01/18   Kalman Shan Ratliff, DO  gabapentin (NEURONTIN) 300 MG  capsule TAKE 2 CAPSULES BY MOUTH TWICE A DAY 01/29/18   Oda Kilts, MD  levETIRAcetam (KEPPRA) 1000 MG tablet Take 1 tablet (1,000 mg total) by mouth 2 (two) times daily. 12/04/17   Cameron Sprang, MD  levothyroxine (SYNTHROID, LEVOTHROID) 175 MCG tablet Take 1 tablet (175 mcg total) by mouth daily before breakfast. 02/08/18   Heber [place], Janett Billow Ratliff, DO  montelukast (SINGULAIR) 10 MG tablet TAKE 1 TABLET BY MOUTH EVERYDAY AT BEDTIME 12/13/17   Hoffman, Jessica Ratliff, DO  nortriptyline (PAMELOR) 25 MG capsule Take  1 capsule every night for 1 week, then increase to 2 capsules every night for 1 week, then increase to 3 capsules every night 12/04/17   Cameron Sprang, MD  potassium chloride SA (K-DUR,KLOR-CON) 20 MEQ tablet Take 1 tablet (20 mEq total) by mouth 2 (two) times daily for 5 days. 11/16/17 11/21/17  McDonald, Mia A, PA-C  PROAIR HFA 108 (90 Base) MCG/ACT inhaler INHALE 1-2 PUFFS INTO THE LUNGS EVERY 6 (SIX) HOURS AS NEEDED FOR WHEEZING OR SHORTNESS OF BREATH. 01/22/18   Aldine Contes, MD  ranitidine (ZANTAC) 150 MG capsule Take 1 capsule (150 mg total) by mouth 2 (two) times daily. 02/01/18   Valinda Party, DO    Family History Family History  Problem Relation Age of Onset  . Hypertension Mother     Social History Social History   Tobacco Use  . Smoking status: Current Every Day Smoker    Packs/day: 0.50    Types: Cigarettes    Last attempt to quit: 08/17/2014    Years since quitting: 3.5  . Smokeless tobacco: Never Used  Substance Use Topics  . Alcohol use: No    Alcohol/week: 0.0 standard drinks  . Drug use: Yes    Types: Marijuana, Cocaine    Comment: Reports former substance use Hx, no current use     Allergies   Ibuprofen   Review of Systems Review of Systems  Constitutional: Negative for chills and fever.  HENT: Negative for ear pain and sore throat.   Eyes: Negative for pain and visual disturbance.  Respiratory: Negative for cough and shortness of breath.   Cardiovascular: Negative for chest pain and palpitations.  Gastrointestinal: Negative for abdominal pain and vomiting.  Genitourinary: Negative for dysuria and hematuria.  Musculoskeletal: Positive for arthralgias. Negative for back pain and stiffness.  Skin: Negative for color change, itching and rash.  Neurological: Negative for tingling, seizures, syncope and numbness.  All other systems reviewed and are negative.    Physical Exam Updated Vital Signs  ED Triage Vitals [03/07/18 1310]  Enc  Vitals Group     BP      Pulse      Resp      Temp      Temp src      SpO2      Weight      Height      Head Circumference      Peak Flow      Pain Score 7     Pain Loc      Pain Edu?      Excl. in [place]?     Physical Exam  Constitutional: She appears well-developed and well-nourished. No distress.  HENT:  Head: Normocephalic and atraumatic.  Eyes: Pupils are equal, round, and reactive to light. Conjunctivae and EOM are normal.  Neck: Normal range of motion. Neck supple.  Cardiovascular: Normal rate, regular rhythm, normal heart sounds and intact distal pulses.  No murmur heard. Pulmonary/Chest: Effort normal and breath sounds normal. No respiratory distress.  Abdominal: Soft. There is no tenderness.  Musculoskeletal: Normal range of motion. She exhibits tenderness (TTP around left shoulder). She exhibits no edema.  No midline spinal pain   Neurological: She is alert. No cranial nerve deficit or sensory deficit. She exhibits normal muscle tone. Coordination normal.  5+/5 strength, normal sensation   Skin: Skin is warm and dry.  Psychiatric: She has a normal mood and affect.  Nursing note and vitals reviewed.    ED Treatments / Results  Labs (all labs ordered are listed, but only abnormal results are displayed) Labs Reviewed - No data to display  EKG None  Radiology Dg Shoulder Left  Result Date: 03/07/2018 CLINICAL DATA:  Status post fall. Left shoulder pain. Loss of range of motion. EXAM: LEFT SHOULDER - 2+ VIEW COMPARISON:  None. FINDINGS: There is no acute fracture or dislocation. There is mild irregularity along the inferior glenoid which may be secondary to prior trauma versus degenerative changes. There are mild degenerative changes of the acromioclavicular joint. IMPRESSION: No acute osseous injury of the left shoulder. Electronically Signed   By: Kathreen Devoid   On: 03/07/2018 13:38    Procedures Procedures (including critical care time)  Medications Ordered  in ED Medications - No data to display   Initial Impression / Assessment and Plan / ED Course  I have reviewed the triage vital signs and the nursing notes.  Pertinent labs & imaging results that were available during my care of the patient were reviewed by me and considered in my medical decision making (see chart for details).     Terah Robey is a 46 year old female who presents to the ED with left shoulder pain.  Patient with fall several weeks ago.  Continue to have left shoulder pain.  Normal vitals.  No fever.  Patient with good range of motion of the left upper extremity.  Neuromuscularly and neurovascularly intact on exam.  Has some bony tenderness.  X-ray was unremarkable.  Patient with possible mild strain or sprain.  Recommend Motrin, Tylenol.  Recommend close follow-up with primary care doctor as may benefit from physical therapy.  If pain persists may need an MRI to evaluate for any ligamentous injury.  Discharged in good condition.  This chart was dictated using voice recognition software.  Despite best efforts to proofread,  errors can occur which can change the documentation meaning.   Final Clinical Impressions(s) / ED Diagnoses   Final diagnoses:  Acute pain of left shoulder    ED Discharge Orders    None       Lennice Sites, DO 03/07/18 1342

## 2018-03-07 NOTE — ED Triage Notes (Signed)
Pt in c/o generalized pain after a fall two months ago, no distress noted

## 2018-03-07 NOTE — Discharge Instructions (Addendum)
No injury found to left shoulder, recommend continued tylenol, motrin. Follow up with primary doctor to talk about physical therapy, MRI.

## 2018-03-09 ENCOUNTER — Encounter: Payer: Self-pay | Admitting: Neurology

## 2018-03-09 ENCOUNTER — Ambulatory Visit: Payer: Medicaid Other | Admitting: Neurology

## 2018-03-09 VITALS — BP 124/80 | HR 74 | Ht 64.0 in | Wt 172.0 lb

## 2018-03-09 DIAGNOSIS — R569 Unspecified convulsions: Secondary | ICD-10-CM

## 2018-03-09 DIAGNOSIS — M25512 Pain in left shoulder: Secondary | ICD-10-CM

## 2018-03-09 DIAGNOSIS — G43009 Migraine without aura, not intractable, without status migrainosus: Secondary | ICD-10-CM | POA: Diagnosis not present

## 2018-03-09 MED ORDER — NORTRIPTYLINE HCL 50 MG PO CAPS: ORAL_CAPSULE | ORAL | 11 refills | Status: AC

## 2018-03-09 MED ORDER — GABAPENTIN 300 MG PO CAPS: ORAL_CAPSULE | ORAL | 11 refills | Status: AC

## 2018-03-09 NOTE — Progress Notes (Signed)
NEUROLOGY FOLLOW UP OFFICE NOTE  Jill Hopkins [id number] Sep 16, 1970  HISTORY OF PRESENT ILLNESS: I had the pleasure of seeing Jill Hopkins in follow-up in the neurology clinic on 03/09/2018.  The patient was last seen 3 months ago. She was initially seen for frequent falls, then on last visit reported new onset shaking episodes in April 2019 and August 2019. She was started on Keppra in the ER. She had a 30-hour EEG which was normal, typical events were not captured. She has only been taking the Keppra 2 to 3 times a week, she does not feel she needs it since she has not had any seizures since stopping Wellbutrin 3-4 months ago. She is doing better on the Pristiq. She sees Research scientist (physical sciences) Psychiatry who prescribes Pristiq and Abilify. She continues to have daily headaches and does not take prn medication. She was started on nortriptyline on last visit, currently on 75mg  qhs without side effects. She is also on Gabapentin 600mg  BID without side effects. She is reporting difficulty sleeping despite taking these medications. She had a fall 2 months ago down 5 steps on her porch, she got dizzy and briefly passed out once she hit the ground. She has been having left shoulder pain since then and went to the ER yesterday where she had an xray with no evidence of fracture.   History on Initial Assessment 12/02/2016: This is a pleasant 47 year old right-handed woman with a history of hypertension, depression, renal disorder, thyroid disease, presenting for evaluation of frequent falls. She states that falls started a year or so ago, but have increased in the past 6 months. She has a warning before she falls, she would start sweating, feel dizzy and nauseated, and can alert family. If she does not sit down, she would go down. She reports falling daily last weekend. Her daughter describes one of the falls 2 weeks ago where she alerted her daughter, asking for water, her daughter tried to hold her up then she fell and  almost cracked her head on the corner of the kitchen table. She apparently passed out and daughter called for help, when patient's mother arrived, they tried to help her but she fell again in the hallway. She reports her balance is just not good, she sometimes just trips over something and can't stop herself from going down. She has a history of neck and back pain for many years, she has had tingling in her fingers, arms and knees down to her feet. She had been prescribed gabapentin 3-4 years ago but lost her insurance and was unable to restart gabapentin 300mg  BID until 2 months ago. No side effects on current dose. She has injured herself with cuts and bruises on her back, neck, legs, and arms, but also she reports her pride is injured when she falls in front of someone. She would usually tell family she is okay and ask them to leave her alone.  She has a history of migraines for the past 10 years, worse in the last 7-8 months, with pain over the temporal and vertex region, associated nausea/vomiting, photo and phonophobia. Tylenol does not help. She has a history of depression and was restarted on Abilify and Wellbutrin last June. Her mother reports that she gets overexcited sometimes with mood swings, better since medications restarted. She has been unable to see her prior psychiatrist due to insurance change. She reports sleeping 12-14 hours at night, but sometimes from Friday to Sunday she would not sleep at all,  her mind is running fast and there is "just a lot of it." She denies any alcohol or drug intake.   Diagnostic Data:  I personally reviewed MRI brain without contrast done 8/10/18which did not show any acute changes. There were new nonacute small right cerebellar infarcts not present on MRI in 2016. She denies any history of stroke. There is an increase in chronic microvascular disease. 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   Hypercoagulable panel was negative. MRI also showed chiari malformation, we ordered an MRI of cervical/thoracic/lumbar spine to assess for syrinx. She had the cervical and thoracic MRI which showed chronic spondylosis with endplate osteophytes and broad-based disc herniation, effacement of the ventral subarachnoid space with indentation of the cord, AP diameter only 34mm, concerning for risk of chronic cord injury. She was referred to Neurosurgery and tells me that surgery was recommended but she declined.   EEG done 06/2014 was normal  PAST MEDICAL HISTORY: Past Medical History:  Diagnosis Date  . Depression   . Hypertension   . Renal disorder   . Seizures ([place])   . Thyroid disease     MEDICATIONS: Current Outpatient Medications on File Prior to Visit  Medication Sig Dispense Refill  . amLODipine (NORVASC) 10 MG tablet Take 1 tablet (10 mg total) by mouth daily. 90 tablet 4  . ARIPiprazole (ABILIFY) 2 MG tablet Take 1 tablet (2 mg total) by mouth daily. 30 tablet 2  . cetirizine (ZYRTEC) 10 MG tablet TAKE 1 TABLET BY MOUTH EVERY DAY 90 tablet 0  . chlorthalidone (HYGROTON) 25 MG tablet Take 1 tablet (25 mg total) by mouth daily. 30 tablet 2  . [facility] IRON 325 (65 Fe) MG tablet TAKE 1 TABLET BY MOUTH EVERY DAY WITH BREAKFAST 60 tablet 11  . desvenlafaxine (PRISTIQ) 50 MG 24 hr tablet Take 50 mg by mouth daily.  2  . etonogestrel-ethinyl estradiol (NUVARING) 0.12-0.015 MG/24HR vaginal ring INSERT 1 RING VAGINALLY AS DIRECTED. REMOVE AFTER 3 WEEKS & WAIT 7 DAYS BEFORE INSERTING A NEW RING 1 each 12  . Fluticasone-Salmeterol (ADVAIR) 100-50 MCG/DOSE AEPB Inhale 1 puff into the lungs 2 (two) times daily. 60 each 2  . gabapentin (NEURONTIN) 300 MG capsule TAKE 2 CAPSULES BY MOUTH TWICE A DAY 120 capsule 0  . levETIRAcetam (KEPPRA) 1000 MG tablet Take 1 tablet (1,000 mg total) by mouth 2 (two) times daily.  (Patient taking differently: Take 1,000 mg by mouth daily. ) 60 tablet 11  . levothyroxine (SYNTHROID, LEVOTHROID) 175 MCG tablet Take 1 tablet (175 mcg total) by mouth daily before breakfast. 30 tablet 2  . montelukast (SINGULAIR) 10 MG tablet TAKE 1 TABLET BY MOUTH EVERYDAY AT BEDTIME 30 tablet 0  . nortriptyline (PAMELOR) 25 MG capsule Take 1 capsule every night for 1 week, then increase to 2 capsules every night for 1 week, then increase to 3 capsules every night (Patient taking differently: Take 75 mg by mouth at bedtime. ) 90 capsule 11  . PROAIR HFA 108 (90 Base) MCG/ACT inhaler INHALE 1-2 PUFFS INTO THE LUNGS EVERY 6 (SIX) HOURS AS NEEDED FOR WHEEZING OR SHORTNESS OF BREATH. 8.5 Inhaler 0  . ranitidine (ZANTAC) 150 MG capsule Take 1 capsule (150 mg total) by mouth 2 (two) times daily. 180 capsule 0  . benzonatate (TESSALON PERLES) 100 MG capsule Take 1 capsule (100 mg total) by  mouth 3 (three) times daily as needed for cough. (Patient not taking: Reported on 03/09/2018) 30 capsule 1  . fluticasone (FLONASE) 50 MCG/ACT nasal spray Place 1 spray into both nostrils daily. 16 g 0  . potassium chloride SA (K-DUR,KLOR-CON) 20 MEQ tablet Take 1 tablet (20 mEq total) by mouth 2 (two) times daily for 5 days. 10 tablet 0   No current facility-administered medications on file prior to visit.     ALLERGIES: Allergies  Allergen Reactions  . Ibuprofen Nausea And Vomiting    FAMILY HISTORY: Family History  Problem Relation Age of Onset  . Hypertension Mother     SOCIAL HISTORY: Social History   Socioeconomic History  . Marital status: Single    Spouse name: Not on file  . Number of children: 1  . Years of education: 38  . Highest education level: Not on file  Occupational History  . Not on file  Social Needs  . Financial resource strain: Not on file  . Food insecurity:    Worry: Not on file    Inability: Not on file  . Transportation needs:    Medical: Not on file    Non-medical:  Not on file  Tobacco Use  . Smoking status: Current Every Day Smoker    Packs/day: 0.50    Types: Cigarettes    Last attempt to quit: 08/17/2014    Years since quitting: 3.5  . Smokeless tobacco: Never Used  Substance and Sexual Activity  . Alcohol use: No    Alcohol/week: 0.0 standard drinks  . Drug use: Yes    Types: Marijuana, Cocaine    Comment: Reports former substance use Hx, no current use  . Sexual activity: Yes    Partners: Male    Birth control/protection: Inserts  Lifestyle  . Physical activity:    Days per week: Not on file    Minutes per session: Not on file  . Stress: Not on file  Relationships  . Social connections:    Talks on phone: Not on file    Gets together: Not on file    Attends religious service: Not on file    Active member of club or organization: Not on file    Attends meetings of clubs or organizations: Not on file    Relationship status: Not on file  . Intimate partner violence:    Fear of current or ex partner: Not on file    Emotionally abused: Not on file    Physically abused: Not on file    Forced sexual activity: Not on file  Other Topics Concern  . Not on file  Social History Narrative   Lives in 1 story home with mother and 1 child   Highest level of education: HS graduate   Unemployed x3 years   Caffeine use: 1 cup tea per day    REVIEW OF SYSTEMS: Constitutional: No fevers, chills, or sweats, no generalized fatigue, change in appetite Eyes: No visual changes, double vision, eye pain Ear, nose and throat: No hearing loss, ear pain, nasal congestion, sore throat Cardiovascular: No chest pain, palpitations Respiratory:  No shortness of breath at rest or with exertion, wheezes GastrointestinaI: No nausea, vomiting, diarrhea, abdominal pain, fecal incontinence Genitourinary:  No dysuria, urinary retention or frequency Musculoskeletal:  + neck pain, back pain Integumentary: No rash, pruritus, skin lesions Neurological: as  above Psychiatric: + depression, insomnia, anxiety Endocrine: No palpitations, fatigue, diaphoresis, mood swings, change in appetite, change in weight, increased thirst Hematologic/Lymphatic:  No anemia, purpura, petechiae. Allergic/Immunologic: no itchy/runny eyes, nasal congestion, recent allergic reactions, rashes  PHYSICAL EXAM: Vitals:   03/09/18 1241  BP: 124/80  Pulse: 74  SpO2: 99%   General: No acute distress Head:  Normocephalic/atraumatic Neck: supple, no paraspinal tenderness, full range of motion Heart:  Regular rate and rhythm Lungs:  Clear to auscultation bilaterally Back: No paraspinal tenderness Skin/Extremities: No rash, no edema Neurological Exam: alert and oriented to person, place, and time. No aphasia or dysarthria. Fund of knowledge is appropriate.  Recent and remote memory are intact.  Attention and concentration are normal.    Able to name objects and repeat phrases. Cranial nerves: Pupils equal, round, reactive to light.  Extraocular movements intact with no nystagmus. Visual fields full. Facial sensation intact. No facial asymmetry. Tongue, uvula, palate midline.  Motor: Bulk and tone normal, muscle strength 5/5 throughout with no pronator drift.  Sensation to light touch intact.  No extinction to double simultaneous stimulation.  Deep tendon reflexes +2 throughout except for absent ankle jerks bilaterally, negative Hoffman sign, no ankle clonus. Toes downgoing.  Finger to nose testing intact.  Gait narrow-based and steady, able to tandem walk adequately.  Romberg negative.  IMPRESSION: This is a pleasant 47 yo RH woman with a history of hypertension, depression, renal disorder, thyroid disease, who initially presented in August 2018 for frequent falls suggestive of vasovagal syncope/presyncope. She was lost to follow-up for a year and presented with new onset convulsions with tongue bite and incontinence in April 2019 and August 2019. She was started on Keppra in  the ER but has not been taking it regularly, stating she has not had any seizures since Wellbutrin was stopped 3-4 months ago. Her 30-hour EEG was normal, we agreed to stop Keppra. She continues to have chronic daily headaches with poor sleep, increase nortriptyline to 100mg  qhs. If no change in headaches, we may increase gabapentin further as tolerated. She knows to minimize over the counter pain medication to 2-3 a week to avoid rebound headaches. We again discussed prior MRI cervical spine showed findings concerning for risk of chronic cord injury, she has not seen Neurosurgery and will call them for follow-up. She will be referred for PT of left shoulder. If no improvement, we will refer to Ortho. Follow-up in 6 months, she knows to call for any changes.   Thank you for allowing me to participate in her care.  Please do not hesitate to call for any questions or concerns.  The duration of this appointment visit was 30 minutes of face-to-face time with the patient.  Greater than 50% of this time was spent in counseling, explanation of diagnosis, planning of further management, and coordination of care.   Ellouise Newer, M.D.   CC: Dr. Heber [place]

## 2018-03-09 NOTE — Patient Instructions (Signed)
1. Refer to PT for left shoulder pain  2. Increase nortriptyline to 100mg  every night. With your current bottle of nortriptyline 25mg , take 4 capsules every night. Once done, your new bottle will be for nortriptyline 50mg , take 2 capsules every night  3. Continue gabapentin 300mg  2 caps twice a day for now. If no change in symptoms in a month, call our office and we will plan to increase gabapentin further  4. Stop Keppra  5. Follow-up with your neurosurgeon to discuss options  6. Follow-up in 6 months or so, call for any changes

## 2018-03-12 ENCOUNTER — Ambulatory Visit: Payer: Medicaid Other | Admitting: Neurology

## 2018-03-17 ENCOUNTER — Other Ambulatory Visit: Payer: Self-pay | Admitting: Internal Medicine

## 2018-03-17 DIAGNOSIS — F332 Major depressive disorder, recurrent severe without psychotic features: Secondary | ICD-10-CM

## 2018-03-21 ENCOUNTER — Other Ambulatory Visit: Payer: Self-pay | Admitting: Internal Medicine

## 2018-03-23 ENCOUNTER — Ambulatory Visit: Payer: Medicaid Other

## 2018-04-03 ENCOUNTER — Ambulatory Visit: Payer: Medicaid Other | Attending: Neurology | Admitting: Physical Therapy

## 2018-08-13 ENCOUNTER — Other Ambulatory Visit: Payer: Self-pay | Admitting: Internal Medicine

## 2018-08-13 DIAGNOSIS — F332 Major depressive disorder, recurrent severe without psychotic features: Secondary | ICD-10-CM

## 2018-08-16 ENCOUNTER — Other Ambulatory Visit: Payer: Self-pay | Admitting: Obstetrics

## 2018-08-16 ENCOUNTER — Other Ambulatory Visit: Payer: Self-pay | Admitting: Internal Medicine

## 2018-08-16 DIAGNOSIS — F332 Major depressive disorder, recurrent severe without psychotic features: Secondary | ICD-10-CM

## 2018-08-16 DIAGNOSIS — Z01419 Encounter for gynecological examination (general) (routine) without abnormal findings: Secondary | ICD-10-CM

## 2018-08-16 DIAGNOSIS — Z0000 Encounter for general adult medical examination without abnormal findings: Secondary | ICD-10-CM

## 2018-08-17 NOTE — Telephone Encounter (Signed)
Patient had this filled on 4/28 by PCP

## 2018-08-17 NOTE — Telephone Encounter (Signed)
Next appt scheduled 5/7 with PCP. 

## 2018-08-17 NOTE — Telephone Encounter (Signed)
Will defer to PCP as there is no clear indication for her to have this. Her last refill was in October.

## 2018-08-19 ENCOUNTER — Emergency Department (HOSPITAL_COMMUNITY): Payer: Medicaid Other

## 2018-08-19 ENCOUNTER — Other Ambulatory Visit: Payer: Self-pay

## 2018-08-19 ENCOUNTER — Emergency Department (HOSPITAL_COMMUNITY)
Admission: EM | Admit: 2018-08-19 | Discharge: 2018-08-19 | Disposition: A | Discharge status: Home / Self Care | Payer: Medicaid Other | Attending: Emergency Medicine | Admitting: Emergency Medicine

## 2018-08-19 ENCOUNTER — Encounter (HOSPITAL_COMMUNITY): Payer: Self-pay

## 2018-08-19 DIAGNOSIS — Y939 Activity, unspecified: Secondary | ICD-10-CM | POA: Diagnosis not present

## 2018-08-19 DIAGNOSIS — W1830XA Fall on same level, unspecified, initial encounter: Secondary | ICD-10-CM | POA: Diagnosis not present

## 2018-08-19 DIAGNOSIS — E079 Disorder of thyroid, unspecified: Secondary | ICD-10-CM | POA: Diagnosis not present

## 2018-08-19 DIAGNOSIS — S2241XA Multiple fractures of ribs, right side, initial encounter for closed fracture: Secondary | ICD-10-CM | POA: Diagnosis not present

## 2018-08-19 DIAGNOSIS — F17210 Nicotine dependence, cigarettes, uncomplicated: Secondary | ICD-10-CM | POA: Diagnosis not present

## 2018-08-19 DIAGNOSIS — Y999 Unspecified external cause status: Secondary | ICD-10-CM | POA: Diagnosis not present

## 2018-08-19 DIAGNOSIS — I129 Hypertensive chronic kidney disease with stage 1 through stage 4 chronic kidney disease, or unspecified chronic kidney disease: Secondary | ICD-10-CM | POA: Diagnosis not present

## 2018-08-19 DIAGNOSIS — Y929 Unspecified place or not applicable: Secondary | ICD-10-CM | POA: Diagnosis not present

## 2018-08-19 DIAGNOSIS — J020 Streptococcal pharyngitis: Secondary | ICD-10-CM | POA: Insufficient documentation

## 2018-08-19 DIAGNOSIS — N189 Chronic kidney disease, unspecified: Secondary | ICD-10-CM | POA: Diagnosis not present

## 2018-08-19 DIAGNOSIS — Z79899 Other long term (current) drug therapy: Secondary | ICD-10-CM | POA: Insufficient documentation

## 2018-08-19 DIAGNOSIS — J029 Acute pharyngitis, unspecified: Secondary | ICD-10-CM | POA: Diagnosis present

## 2018-08-19 LAB — GROUP A STREP BY PCR: Group A Strep by PCR: DETECTED — AB

## 2018-08-19 MED ORDER — AMOXICILLIN 500 MG PO CAPS: 500.0000 mg | ORAL_CAPSULE | Freq: Three times a day (TID) | ORAL | 0 refills | Status: AC

## 2018-08-19 MED ORDER — HYDROCODONE-ACETAMINOPHEN 5-325 MG PO TABS: 2.0000 | ORAL_TABLET | ORAL | 0 refills | Status: AC | PRN

## 2018-08-19 NOTE — Discharge Instructions (Signed)
Return if any problems.  See your Physicain for recheck  

## 2018-08-19 NOTE — ED Notes (Signed)
Patient transported to X-ray 

## 2018-08-19 NOTE — ED Provider Notes (Signed)
[facility] EMERGENCY DEPARTMENT Provider Note   CSN: [id number] Arrival date & time: 08/19/18  1339    History   Chief Complaint Chief Complaint  Patient presents with  . Sore Throat    HPI Jill Hopkins is a 48 y.o. female.     The history is provided by the patient. No language interpreter was used.  Sore Throat  This is a new problem. The problem occurs constantly. The problem has been gradually worsening. Associated symptoms include chest pain. Nothing aggravates the symptoms. Nothing relieves the symptoms. She has tried nothing for the symptoms. The treatment provided no relief.   Pt complains of a sore throat.  Pt worried that she has strep.  Pt reports she had a seizure 2 days ago and hit her ribs.  Pt thinks she has a broken rib  Past Medical History:  Diagnosis Date  . Depression   . Hypertension   . Renal disorder   . Seizures ([place])   . Thyroid disease     Patient Active Problem List   Diagnosis Date Noted  . Insomnia 02/03/2018  . Chronic urticaria 10/19/2017  . Seizure ([place]) 10/01/2017  . URI (upper respiratory infection) 10/01/2017  . Breast mass 01/31/2017  . Nodule of soft tissue 01/31/2017  . 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 CVA (cerebral vascular accident) ([place]) 11/25/2016  . Falls 10/14/2016  . Unintentional weight loss 10/14/2016  . Chronic kidney disease 09/21/2016  . Encounter for HIV (human immunodeficiency virus) test 09/21/2016  . 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 Paresthesias   . Hypertension 07/02/2014  . Thyroid disease 07/02/2014  . Depression 07/02/2014  . Excessive or frequent menstruation 03/18/2013    Past Surgical History:  Procedure Laterality Date  . CERVICAL CERCLAGE       OB History   No obstetric history on file.      Home Medications    Prior to Admission medications   Medication Sig Start Date End Date Taking? Authorizing Provider  amLODipine (NORVASC) 10 MG tablet Take 1  tablet (10 mg total) by mouth daily. 09/28/17   Kalman Shan Ratliff, DO  amoxicillin (AMOXIL) 500 MG capsule Take 1 capsule (500 mg total) by mouth 3 (three) times daily. 08/19/18   Fransico Meadow, PA-C  ARIPiprazole (ABILIFY) 2 MG tablet Take 1 tablet (2 mg total) by mouth daily. 09/28/17   Kalman Shan Ratliff, DO  benzonatate (TESSALON PERLES) 100 MG capsule Take 1 capsule (100 mg total) by mouth 3 (three) times daily as needed for cough. Patient not taking: Reported on 03/09/2018 02/01/18 02/01/19  Kalman Shan Ratliff, DO  buPROPion [facility]) 150 MG 12 hr tablet TAKE 1 TABLET BY MOUTH TWICE A DAY 08/14/18   Hoffman, Jessica Ratliff, DO  cetirizine (ZYRTEC) 10 MG tablet TAKE 1 TABLET BY MOUTH EVERY DAY 11/29/17   Kalman Shan Ratliff, DO  chlorthalidone (HYGROTON) 25 MG tablet Take 1 tablet (25 mg total) by mouth daily. 02/01/18   Valinda Party, DO  [facility] IRON 325 (65 Fe) MG tablet TAKE 1 TABLET BY MOUTH EVERY DAY WITH BREAKFAST 12/31/17   Shelly Bombard, MD  desvenlafaxine (PRISTIQ) 50 MG 24 hr tablet Take 50 mg by mouth daily. 11/29/17   [provider]  etonogestrel-ethinyl estradiol (NUVARING) 0.12-0.015 MG/24HR vaginal ring INSERT 1 RING VAGINALLY AS DIRECTED. REMOVE AFTER 3 WEEKS & WAIT 7 DAYS BEFORE INSERTING A NEW RING 12/31/17   Shelly Bombard, MD  fluticasone [facility]) 50  MCG/ACT nasal spray Place 1 spray into both nostrils daily. 03/21/18 06/19/18  Kalman Shan Ratliff, DO  Fluticasone-Salmeterol (ADVAIR) 100-50 MCG/DOSE AEPB Inhale 1 puff into the lungs 2 (two) times daily. 02/01/18   Valinda Party, DO  gabapentin (NEURONTIN) 300 MG capsule TAKE 2 CAPSULES BY MOUTH TWICE A DAY 03/09/18   Cameron Sprang, MD  HYDROcodone-acetaminophen (NORCO/VICODIN) 5-325 MG tablet Take 2 tablets by mouth every 4 (four) hours as needed. 08/19/18   Fransico Meadow, PA-C  levothyroxine (SYNTHROID, LEVOTHROID) 175 MCG tablet Take 1 tablet (175 mcg total) by  mouth daily before breakfast. 02/08/18   Heber Marshall, Elza Rafter, DO  montelukast (SINGULAIR) 10 MG tablet TAKE 1 TABLET BY MOUTH EVERYDAY AT BEDTIME 12/13/17   Valinda Party, DO  nortriptyline (PAMELOR) 50 MG capsule Take 2 caps every night 03/09/18   Cameron Sprang, MD  potassium chloride SA (K-DUR,KLOR-CON) 20 MEQ tablet Take 1 tablet (20 mEq total) by mouth 2 (two) times daily for 5 days. 11/16/17 11/21/17  McDonald, Mia A, PA-C  Prenat-FeCbn-FeAsp-Meth-FA-DHA (PRENATE MINI) 18-0.6-0.4-350 MG CAPS TAKE 1 CAPSULE BY MOUTH EVERY DAY BEFORE BREAKFAST 08/17/18   Shelly Bombard, MD  PROAIR HFA 108 [phone] Base) MCG/ACT inhaler INHALE 1-2 PUFFS INTO THE LUNGS EVERY 6 (SIX) HOURS AS NEEDED FOR WHEEZING OR SHORTNESS OF BREATH. 01/22/18   Aldine Contes, MD  ranitidine (ZANTAC) 150 MG capsule Take 1 capsule (150 mg total) by mouth 2 (two) times daily. 02/01/18   Valinda Party, DO    Family History Family History  Problem Relation Age of Onset  . Hypertension Mother     Social History Social History   Tobacco Use  . Smoking status: Current Every Day Smoker    Packs/day: 0.50    Types: Cigarettes    Last attempt to quit: 08/17/2014    Years since quitting: 4.0  . Smokeless tobacco: Never Used  Substance Use Topics  . Alcohol use: No    Alcohol/week: 0.0 standard drinks  . Drug use: Yes    Types: Marijuana, Cocaine    Comment: Reports former substance use Hx, no current use     Allergies   Ibuprofen   Review of Systems Review of Systems  Cardiovascular: Positive for chest pain.  All other systems reviewed and are negative.    Physical Exam Updated Vital Signs BP (!) 111/95 (BP Location: Right Arm)   Pulse (!) 134   Temp 98.6 F (37 C) (Oral)   Resp 16   Ht 5\' 4"  (1.626 m)   Wt 74.8 kg   SpO2 98%   BMI 28.32 kg/m   Physical Exam Vitals signs and nursing note reviewed.  Constitutional:      Appearance: She is well-developed.  HENT:     Head:  Normocephalic.     Right Ear: Tympanic membrane normal.     Left Ear: Tympanic membrane normal.     Mouth/Throat:     Pharynx: Pharyngeal swelling present.  Eyes:     Conjunctiva/sclera: Conjunctivae normal.  Neck:     Musculoskeletal: Normal range of motion.  Cardiovascular:     Rate and Rhythm: Normal rate.  Pulmonary:     Effort: Pulmonary effort is normal.  Abdominal:     General: There is no distension.  Musculoskeletal: Normal range of motion.  Skin:    General: Skin is warm.  Neurological:     General: No focal deficit present.     Mental Status: She is alert and  oriented to person, place, and time.      ED Treatments / Results  Labs (all labs ordered are listed, but only abnormal results are displayed) Labs Reviewed  GROUP A STREP BY PCR - Abnormal; Notable for the following components:      Result Value   Group A Strep by PCR DETECTED (*)    All other components within normal limits    EKG None  Radiology Dg Ribs Unilateral W/chest Right  Result Date: 08/19/2018 CLINICAL DATA:  Status post fall, right rib pain EXAM: RIGHT RIBS AND CHEST - 3+ VIEW COMPARISON:  None. FINDINGS: Nondisplaced fracture of the right seventh and eighth anterolateral rib. There is no evidence of pneumothorax or pleural effusion. Both lungs are clear. Heart size and mediastinal contours are within normal limits. IMPRESSION: Nondisplaced fracture of the right seventh and eighth anterolateral rib. Electronically Signed   By: Kathreen Devoid   On: 08/19/2018 15:05    Procedures Procedures (including critical care time)  Medications Ordered in ED Medications - No data to display   Initial Impression / Assessment and Plan / ED Course  I have reviewed the triage vital signs and the nursing notes.  Pertinent labs & imaging results that were available during my care of the patient were reviewed by me and considered in my medical decision making (see chart for details).       MDM  Strep  is positive,Chest xray shows 2 fractured ribs.  Pt given rx for amoxicillian and hydrocodone.   Final Clinical Impressions(s) / ED Diagnoses   Final diagnoses:  Strep pharyngitis  Closed fracture of multiple ribs of right side, initial encounter    ED Discharge Orders         Ordered    amoxicillin (AMOXIL) 500 MG capsule  3 times daily     08/19/18 1514    HYDROcodone-acetaminophen (NORCO/VICODIN) 5-325 MG tablet  Every 4 hours PRN     08/19/18 1514        An After Visit Summary was printed and given to the patient.    Fransico Meadow, [place] 08/19/18 [place], MD 08/27/18 Laureen Abrahams

## 2018-08-19 NOTE — ED Triage Notes (Signed)
Pt endorses sore throat x 1 week with difficulty swallowing, states "everything I try to drink comes up my nose" Pt also thinks she may have had a seizure 2 days ago and has hx of same, pt thinks that during seizures she hurt her right ribs. Tachy. Axox4. Airway intact.

## 2018-08-19 NOTE — Telephone Encounter (Signed)
Will discuss at next clinic visit

## 2018-08-19 NOTE — ED Notes (Signed)
Pt verbalized understanding of d/c instructions and has no further questions, VSS, NAD.  

## 2018-08-20 ENCOUNTER — Telehealth: Payer: Self-pay

## 2018-08-20 DIAGNOSIS — I499 Cardiac arrhythmia, unspecified: Secondary | ICD-10-CM | POA: Diagnosis not present

## 2018-08-20 DIAGNOSIS — R4020 Unspecified coma: Secondary | ICD-10-CM | POA: Diagnosis not present

## 2018-08-20 DIAGNOSIS — E1165 Type 2 diabetes mellitus with hyperglycemia: Secondary | ICD-10-CM | POA: Diagnosis not present

## 2018-08-20 DIAGNOSIS — R404 Transient alteration of awareness: Secondary | ICD-10-CM | POA: Diagnosis not present

## 2018-08-20 DIAGNOSIS — R0689 Other abnormalities of breathing: Secondary | ICD-10-CM | POA: Diagnosis not present

## 2018-08-20 NOTE — Telephone Encounter (Signed)
Jenny Reichmann from [facility] called [phone] informed that Dr Delice Lesch could not sign death certificate. He stated thank you and that he understood pt had not been seen in the office since November 2019 and has been seen by another MD.

## 2018-08-20 NOTE — Telephone Encounter (Signed)
-----   Message from Cameron Sprang, MD sent at 09/09/2018  2:59 PM EDT ----- Regarding: FW: Death Certificate Pls call and let him know I cannot sign the death certificate, looks like she was in the ER yesterday for sore throat, chest pain, and rib fractures, and was prescribed amoxicillin and hydrocodone. I last saw her in November 2019. It would be very difficult for me to sign a document since she was seen by a physician after me. Recommend he contact the doctor she saw yesterday, Dr. Charlesetta Shanks at ER or PCP Dr. Kalman Shan.  Thanks ----- Message ----- From: Lurene Shadow Sent: 09/01/2018   2:50 PM EDT To: Cameron Sprang, MD Subject: Death Certificate                              Good afternoon,  John called from [facility]. I believe he said he was a cop or sheriff with [facility] on the scene where this patient has passed away. He was wanting to speak with you in regards to signing the death certificate. If you would please give him a call back at [phone].  Thanks!  Chelsea

## 2018-08-21 ENCOUNTER — Encounter: Payer: Self-pay | Admitting: Internal Medicine

## 2018-08-21 NOTE — Progress Notes (Unsigned)
I was asked to sign the death certificate for Jill Hopkins as attending of the day.  Reviewed patients history and EMS run sheet. I also called patients mother Dejae Bernet for collateral information.     Patient with history of seizure disorder HTN, CKD stage 2, previous CVA, uncontrolled hypothryoidism, and depression. Patient had been feeling unwell for a few days, went to ED on Saturday and tested positive for strep throat and was prescribed amoxicillin.  She was also noted to have rib fractures from recent seizure and was prescribed hydrocodone-APAP. Mother reports she was not eating well for the last few days, patients daughter brought lunch to her in bed yesterday afternoon, later they heard something from the bedroom and she was unresponsive with her face in her food.  They called 911 and started chest compressions.  EMS run sheet shows she was GCS 3 on arrival with PEA. An airway was placed but she was noted to have aspirated 300cc of emesis which needed to be suctioned.  She was continued on ACLS protocol until it was terminated at the directive of EDP.  Given her chronic uncontrolled medical conditions, recent seizures, recent illness and medication changes suspect cardiac arrest>> aspiration>> seizure disorder.  I expressed my condolences to [place] and the family.  Death certificate completed.

## 2018-08-22 NOTE — Telephone Encounter (Signed)
No further action needed-pt is deceased.Jill Hopkins, Jill Etherington Cassady5/6/202010:08 AM

## 2018-08-23 ENCOUNTER — Encounter: Payer: Medicaid Other | Admitting: Internal Medicine

## 2018-09-17 DEATH — deceased

## 2018-09-27 ENCOUNTER — Telehealth: Payer: Self-pay | Admitting: Neurology

## 2018-09-27 NOTE — Telephone Encounter (Signed)
Tried to call pt mother back number did not work,

## 2018-09-27 NOTE — Telephone Encounter (Signed)
Patient's mom is calling in wanting to let you know that the patient has passed away and she was wanting to speak with someone about her condition. Please call her back at [phone]. Thanks!

## 2018-10-01 NOTE — Telephone Encounter (Signed)
Pls see if mother answers, thanks

## 2018-10-01 NOTE — Telephone Encounter (Signed)
Return pt mother's call she is asking for pts medical information and records. Pt mother Mrs. Heidel was informed that I could not give her that information over the phone that she would have to contact medical records. She was given medical records number so that they could direct her in how to get her daughters medical information.

## 2018-10-22 ENCOUNTER — Ambulatory Visit: Payer: Medicaid Other | Admitting: Neurology

## 2019-03-08 ENCOUNTER — Encounter
